# Patient Record
Sex: Male | Born: 1950 | Race: White | Hispanic: No | Smoking: Current every day smoker
Health system: Southern US, Community
[De-identification: ages and names within clinical notes are randomized; demographics above are authoritative.]

## PROBLEM LIST (undated history)

## (undated) DIAGNOSIS — R251 Tremor, unspecified: Secondary | ICD-10-CM

## (undated) DIAGNOSIS — E039 Hypothyroidism, unspecified: Secondary | ICD-10-CM

## (undated) DIAGNOSIS — G4700 Insomnia, unspecified: Secondary | ICD-10-CM

## (undated) DIAGNOSIS — F1010 Alcohol abuse, uncomplicated: Secondary | ICD-10-CM

## (undated) DIAGNOSIS — I10 Essential (primary) hypertension: Secondary | ICD-10-CM

## (undated) DIAGNOSIS — G629 Polyneuropathy, unspecified: Secondary | ICD-10-CM

## (undated) DIAGNOSIS — K219 Gastro-esophageal reflux disease without esophagitis: Secondary | ICD-10-CM

## (undated) DIAGNOSIS — E291 Testicular hypofunction: Secondary | ICD-10-CM

## (undated) DIAGNOSIS — R011 Cardiac murmur, unspecified: Secondary | ICD-10-CM

## (undated) DIAGNOSIS — G709 Myoneural disorder, unspecified: Secondary | ICD-10-CM

## (undated) DIAGNOSIS — E119 Type 2 diabetes mellitus without complications: Secondary | ICD-10-CM

## (undated) DIAGNOSIS — K759 Inflammatory liver disease, unspecified: Secondary | ICD-10-CM

## (undated) DIAGNOSIS — F32A Depression, unspecified: Secondary | ICD-10-CM

## (undated) DIAGNOSIS — M1990 Unspecified osteoarthritis, unspecified site: Secondary | ICD-10-CM

## (undated) DIAGNOSIS — C801 Malignant (primary) neoplasm, unspecified: Secondary | ICD-10-CM

## (undated) DIAGNOSIS — F419 Anxiety disorder, unspecified: Secondary | ICD-10-CM

## (undated) DIAGNOSIS — K5090 Crohn's disease, unspecified, without complications: Secondary | ICD-10-CM

## (undated) DIAGNOSIS — D649 Anemia, unspecified: Secondary | ICD-10-CM

## (undated) HISTORY — DX: Alcohol abuse, uncomplicated: F10.10

## (undated) HISTORY — PX: HERNIA REPAIR: SHX51

## (undated) HISTORY — DX: Crohn's disease, unspecified, without complications: K50.90

## (undated) HISTORY — DX: Testicular hypofunction: E29.1

## (undated) HISTORY — DX: Anxiety disorder, unspecified: F41.9

## (undated) HISTORY — DX: Polyneuropathy, unspecified: G62.9

## (undated) HISTORY — DX: Depression, unspecified: F32.A

## (undated) HISTORY — DX: Anemia, unspecified: D64.9

## (undated) HISTORY — DX: Insomnia, unspecified: G47.00

## (undated) HISTORY — DX: Tremor, unspecified: R25.1

## (undated) HISTORY — PX: OTHER SURGICAL HISTORY: SHX169

---

## 2014-01-14 ENCOUNTER — Other Ambulatory Visit: Payer: Self-pay | Admitting: Nurse Practitioner

## 2014-01-14 DIAGNOSIS — B182 Chronic viral hepatitis C: Secondary | ICD-10-CM

## 2014-01-14 DIAGNOSIS — K7460 Unspecified cirrhosis of liver: Secondary | ICD-10-CM

## 2014-02-28 ENCOUNTER — Ambulatory Visit
Admission: RE | Admit: 2014-02-28 | Discharge: 2014-02-28 | Disposition: A | Discharge status: Home / Self Care | Payer: BC Managed Care – PPO | Source: Ambulatory Visit | Attending: Nurse Practitioner | Admitting: Nurse Practitioner

## 2014-02-28 DIAGNOSIS — B182 Chronic viral hepatitis C: Secondary | ICD-10-CM

## 2014-02-28 DIAGNOSIS — K7460 Unspecified cirrhosis of liver: Secondary | ICD-10-CM

## 2014-08-24 ENCOUNTER — Other Ambulatory Visit: Payer: Self-pay | Admitting: Nurse Practitioner

## 2014-08-24 DIAGNOSIS — C220 Liver cell carcinoma: Secondary | ICD-10-CM

## 2014-09-08 ENCOUNTER — Ambulatory Visit
Admission: RE | Admit: 2014-09-08 | Discharge: 2014-09-08 | Disposition: A | Discharge status: Home / Self Care | Payer: BLUE CROSS/BLUE SHIELD | Source: Ambulatory Visit | Attending: Nurse Practitioner | Admitting: Nurse Practitioner

## 2014-09-08 DIAGNOSIS — C220 Liver cell carcinoma: Secondary | ICD-10-CM

## 2015-03-01 ENCOUNTER — Other Ambulatory Visit (HOSPITAL_COMMUNITY): Payer: Self-pay | Admitting: Nurse Practitioner

## 2015-03-01 DIAGNOSIS — R188 Other ascites: Secondary | ICD-10-CM

## 2015-03-02 ENCOUNTER — Other Ambulatory Visit: Payer: Self-pay | Admitting: Nurse Practitioner

## 2015-03-02 DIAGNOSIS — K7469 Other cirrhosis of liver: Secondary | ICD-10-CM

## 2015-03-13 ENCOUNTER — Other Ambulatory Visit (HOSPITAL_COMMUNITY): Payer: Self-pay | Admitting: Nurse Practitioner

## 2015-03-13 ENCOUNTER — Ambulatory Visit (HOSPITAL_COMMUNITY)
Admission: RE | Admit: 2015-03-13 | Discharge: 2015-03-13 | Disposition: A | Discharge status: Home / Self Care | Payer: BLUE CROSS/BLUE SHIELD | Source: Ambulatory Visit | Attending: Nurse Practitioner | Admitting: Nurse Practitioner

## 2015-03-13 DIAGNOSIS — R188 Other ascites: Secondary | ICD-10-CM | POA: Diagnosis not present

## 2015-03-13 NOTE — Progress Notes (Signed)
Patient presented for US guided paracentesis, upon reviewing images no ascites is seen. Procedure has been cancelled.  Tsosie Billing PA-C Interventional Radiology  03/13/15  11:48 AM

## 2015-03-31 ENCOUNTER — Ambulatory Visit
Admission: RE | Admit: 2015-03-31 | Discharge: 2015-03-31 | Disposition: A | Discharge status: Home / Self Care | Payer: BLUE CROSS/BLUE SHIELD | Source: Ambulatory Visit | Attending: Nurse Practitioner | Admitting: Nurse Practitioner

## 2015-03-31 DIAGNOSIS — K7469 Other cirrhosis of liver: Secondary | ICD-10-CM

## 2015-03-31 MED ORDER — GADOXETATE DISODIUM 0.25 MMOL/ML IV SOLN
10.0000 mL | Freq: Once | INTRAVENOUS | Status: AC | PRN
  Administered 2015-03-31: 10 mL via INTRAVENOUS

## 2015-07-17 ENCOUNTER — Other Ambulatory Visit: Payer: Self-pay | Admitting: Physician Assistant

## 2015-07-17 DIAGNOSIS — K741 Hepatic sclerosis: Secondary | ICD-10-CM

## 2015-09-27 ENCOUNTER — Ambulatory Visit
Admission: RE | Admit: 2015-09-27 | Discharge: 2015-09-27 | Disposition: A | Discharge status: Home / Self Care | Payer: BLUE CROSS/BLUE SHIELD | Source: Ambulatory Visit | Attending: Physician Assistant | Admitting: Physician Assistant

## 2015-09-27 DIAGNOSIS — K741 Hepatic sclerosis: Secondary | ICD-10-CM

## 2015-11-29 DIAGNOSIS — E1165 Type 2 diabetes mellitus with hyperglycemia: Secondary | ICD-10-CM | POA: Diagnosis not present

## 2015-11-29 DIAGNOSIS — E782 Mixed hyperlipidemia: Secondary | ICD-10-CM | POA: Diagnosis not present

## 2015-12-01 DIAGNOSIS — E038 Other specified hypothyroidism: Secondary | ICD-10-CM | POA: Diagnosis not present

## 2015-12-01 DIAGNOSIS — I10 Essential (primary) hypertension: Secondary | ICD-10-CM | POA: Diagnosis not present

## 2015-12-01 DIAGNOSIS — E782 Mixed hyperlipidemia: Secondary | ICD-10-CM | POA: Diagnosis not present

## 2015-12-01 DIAGNOSIS — Z794 Long term (current) use of insulin: Secondary | ICD-10-CM | POA: Diagnosis not present

## 2015-12-01 DIAGNOSIS — E1142 Type 2 diabetes mellitus with diabetic polyneuropathy: Secondary | ICD-10-CM | POA: Diagnosis not present

## 2015-12-01 DIAGNOSIS — E1165 Type 2 diabetes mellitus with hyperglycemia: Secondary | ICD-10-CM | POA: Diagnosis not present

## 2015-12-01 DIAGNOSIS — R809 Proteinuria, unspecified: Secondary | ICD-10-CM | POA: Diagnosis not present

## 2015-12-01 DIAGNOSIS — R801 Persistent proteinuria, unspecified: Secondary | ICD-10-CM | POA: Diagnosis not present

## 2016-01-29 DIAGNOSIS — Z79899 Other long term (current) drug therapy: Secondary | ICD-10-CM | POA: Diagnosis not present

## 2016-01-29 DIAGNOSIS — Z6835 Body mass index (BMI) 35.0-35.9, adult: Secondary | ICD-10-CM | POA: Diagnosis not present

## 2016-01-29 DIAGNOSIS — L309 Dermatitis, unspecified: Secondary | ICD-10-CM | POA: Diagnosis not present

## 2016-01-29 DIAGNOSIS — B356 Tinea cruris: Secondary | ICD-10-CM | POA: Diagnosis not present

## 2016-02-16 DIAGNOSIS — F17200 Nicotine dependence, unspecified, uncomplicated: Secondary | ICD-10-CM | POA: Diagnosis not present

## 2016-02-16 DIAGNOSIS — I10 Essential (primary) hypertension: Secondary | ICD-10-CM | POA: Diagnosis not present

## 2016-02-16 DIAGNOSIS — E785 Hyperlipidemia, unspecified: Secondary | ICD-10-CM | POA: Diagnosis not present

## 2016-02-16 DIAGNOSIS — E1140 Type 2 diabetes mellitus with diabetic neuropathy, unspecified: Secondary | ICD-10-CM | POA: Diagnosis not present

## 2016-02-16 DIAGNOSIS — R9431 Abnormal electrocardiogram [ECG] [EKG]: Secondary | ICD-10-CM | POA: Diagnosis not present

## 2016-02-16 DIAGNOSIS — Z0000 Encounter for general adult medical examination without abnormal findings: Secondary | ICD-10-CM | POA: Diagnosis not present

## 2016-02-16 DIAGNOSIS — Z125 Encounter for screening for malignant neoplasm of prostate: Secondary | ICD-10-CM | POA: Diagnosis not present

## 2016-02-16 DIAGNOSIS — E039 Hypothyroidism, unspecified: Secondary | ICD-10-CM | POA: Diagnosis not present

## 2016-02-16 DIAGNOSIS — C61 Malignant neoplasm of prostate: Secondary | ICD-10-CM | POA: Diagnosis not present

## 2016-02-22 DIAGNOSIS — M1712 Unilateral primary osteoarthritis, left knee: Secondary | ICD-10-CM | POA: Diagnosis not present

## 2016-02-28 DIAGNOSIS — R748 Abnormal levels of other serum enzymes: Secondary | ICD-10-CM | POA: Diagnosis not present

## 2016-02-28 DIAGNOSIS — R9431 Abnormal electrocardiogram [ECG] [EKG]: Secondary | ICD-10-CM | POA: Diagnosis not present

## 2016-02-28 DIAGNOSIS — E1169 Type 2 diabetes mellitus with other specified complication: Secondary | ICD-10-CM | POA: Diagnosis not present

## 2016-02-28 DIAGNOSIS — E785 Hyperlipidemia, unspecified: Secondary | ICD-10-CM | POA: Diagnosis not present

## 2016-02-28 DIAGNOSIS — Z01810 Encounter for preprocedural cardiovascular examination: Secondary | ICD-10-CM | POA: Diagnosis not present

## 2016-02-28 DIAGNOSIS — M1711 Unilateral primary osteoarthritis, right knee: Secondary | ICD-10-CM | POA: Diagnosis not present

## 2016-02-29 ENCOUNTER — Ambulatory Visit: Payer: Self-pay | Admitting: Orthopedic Surgery

## 2016-02-29 DIAGNOSIS — E038 Other specified hypothyroidism: Secondary | ICD-10-CM | POA: Diagnosis not present

## 2016-02-29 DIAGNOSIS — E1165 Type 2 diabetes mellitus with hyperglycemia: Secondary | ICD-10-CM | POA: Diagnosis not present

## 2016-02-29 DIAGNOSIS — E782 Mixed hyperlipidemia: Secondary | ICD-10-CM | POA: Diagnosis not present

## 2016-03-04 ENCOUNTER — Other Ambulatory Visit: Payer: Self-pay | Admitting: Nurse Practitioner

## 2016-03-04 DIAGNOSIS — K7469 Other cirrhosis of liver: Secondary | ICD-10-CM

## 2016-03-04 DIAGNOSIS — Z23 Encounter for immunization: Secondary | ICD-10-CM | POA: Diagnosis not present

## 2016-03-04 DIAGNOSIS — R809 Proteinuria, unspecified: Secondary | ICD-10-CM | POA: Diagnosis not present

## 2016-03-04 DIAGNOSIS — K7581 Nonalcoholic steatohepatitis (NASH): Secondary | ICD-10-CM | POA: Diagnosis not present

## 2016-03-04 DIAGNOSIS — Z794 Long term (current) use of insulin: Secondary | ICD-10-CM | POA: Diagnosis not present

## 2016-03-04 DIAGNOSIS — E1165 Type 2 diabetes mellitus with hyperglycemia: Secondary | ICD-10-CM | POA: Diagnosis not present

## 2016-03-04 DIAGNOSIS — E782 Mixed hyperlipidemia: Secondary | ICD-10-CM | POA: Diagnosis not present

## 2016-03-04 DIAGNOSIS — R801 Persistent proteinuria, unspecified: Secondary | ICD-10-CM | POA: Diagnosis not present

## 2016-03-04 DIAGNOSIS — I10 Essential (primary) hypertension: Secondary | ICD-10-CM | POA: Diagnosis not present

## 2016-03-04 DIAGNOSIS — E1142 Type 2 diabetes mellitus with diabetic polyneuropathy: Secondary | ICD-10-CM | POA: Diagnosis not present

## 2016-03-04 DIAGNOSIS — E038 Other specified hypothyroidism: Secondary | ICD-10-CM | POA: Diagnosis not present

## 2016-03-05 DIAGNOSIS — Z01810 Encounter for preprocedural cardiovascular examination: Secondary | ICD-10-CM | POA: Diagnosis not present

## 2016-03-05 DIAGNOSIS — R9431 Abnormal electrocardiogram [ECG] [EKG]: Secondary | ICD-10-CM | POA: Diagnosis not present

## 2016-03-12 ENCOUNTER — Ambulatory Visit
Admission: RE | Admit: 2016-03-12 | Discharge: 2016-03-12 | Disposition: A | Discharge status: Home / Self Care | Payer: Medicare Other | Source: Ambulatory Visit | Attending: Nurse Practitioner | Admitting: Nurse Practitioner

## 2016-03-12 DIAGNOSIS — K7469 Other cirrhosis of liver: Secondary | ICD-10-CM

## 2016-03-12 DIAGNOSIS — K7460 Unspecified cirrhosis of liver: Secondary | ICD-10-CM | POA: Diagnosis not present

## 2016-03-15 ENCOUNTER — Ambulatory Visit: Payer: Self-pay | Admitting: Orthopedic Surgery

## 2016-04-02 DIAGNOSIS — N529 Male erectile dysfunction, unspecified: Secondary | ICD-10-CM | POA: Diagnosis not present

## 2016-04-02 DIAGNOSIS — M1712 Unilateral primary osteoarthritis, left knee: Secondary | ICD-10-CM | POA: Diagnosis not present

## 2016-04-02 DIAGNOSIS — K219 Gastro-esophageal reflux disease without esophagitis: Secondary | ICD-10-CM | POA: Diagnosis not present

## 2016-04-02 DIAGNOSIS — E1140 Type 2 diabetes mellitus with diabetic neuropathy, unspecified: Secondary | ICD-10-CM | POA: Diagnosis not present

## 2016-04-02 DIAGNOSIS — I452 Bifascicular block: Secondary | ICD-10-CM | POA: Diagnosis not present

## 2016-04-02 DIAGNOSIS — Z6835 Body mass index (BMI) 35.0-35.9, adult: Secondary | ICD-10-CM | POA: Diagnosis not present

## 2016-04-02 DIAGNOSIS — I10 Essential (primary) hypertension: Secondary | ICD-10-CM | POA: Diagnosis not present

## 2016-04-09 DIAGNOSIS — H25813 Combined forms of age-related cataract, bilateral: Secondary | ICD-10-CM | POA: Diagnosis not present

## 2016-04-09 DIAGNOSIS — E113293 Type 2 diabetes mellitus with mild nonproliferative diabetic retinopathy without macular edema, bilateral: Secondary | ICD-10-CM | POA: Diagnosis not present

## 2016-04-18 DIAGNOSIS — M1712 Unilateral primary osteoarthritis, left knee: Secondary | ICD-10-CM | POA: Diagnosis not present

## 2016-04-26 NOTE — Patient Instructions (Signed)
Benjamin Aguilar  04/26/2016   Your procedure is scheduled on: 05/06/2016    Report to [facility] Main  Entrance take [place]  elevators to 3rd floor to  [place] at   1145 AM.  Call this number if you have problems the morning of surgery [phone]   Remember: ONLY 1 PERSON MAY GO WITH YOU TO SHORT STAY TO GET  READY MORNING OF [place].  Do not eat food after midnite.  May have clear liquids from 12 midnite until 0700am morning of surgery then nothing by mouth.      Take these medicines the morning of surgery with A SIP OF WATER: Amlodipine ( NOrvasc), synthroid, Lyrica, Metoprolol ( Toprol ), Prilosec  DO NOT TAKE ANY DIABETIC MEDICATIONS DAY OF YOUR SURGERY                               You may not have any metal on your body including hair pins and              piercings  Do not wear jewelry, , lotions, powders or perfumes, deodorant                           Men may shave face and neck.   Do not bring valuables to the hospital. [place].  Contacts, dentures or bridgework may not be worn into surgery.  Leave suitcase in the car. After surgery it may be brought to your room.       Special Instructions: N/A              Please read over the following fact sheets you were given: _____________________________________________________________________             [facility] - Preparing for Surgery Before surgery, you can play an important role.  Because skin is not sterile, your skin needs to be as free of germs as possible.  You can reduce the number of germs on your skin by washing with CHG (chlorahexidine gluconate) soap before surgery.  CHG is an antiseptic cleaner which kills germs and bonds with the skin to continue killing germs even after washing. Please DO NOT use if you have an allergy to CHG or antibacterial soaps.  If your skin becomes reddened/irritated stop using the CHG and inform  your nurse when you arrive at Short Stay. Do not shave (including legs and underarms) for at least 48 hours prior to the first CHG shower.  You may shave your face/neck. Please follow these instructions carefully:  1.  Shower with CHG Soap the night before surgery and the  morning of Surgery.  2.  If you choose to wash your hair, wash your hair first as usual with your  normal  shampoo.  3.  After you shampoo, rinse your hair and body thoroughly to remove the  shampoo.                           4.  Use CHG as you would any other liquid soap.  You can apply chg directly  to the skin and wash  Gently with a scrungie or clean washcloth.  5.  Apply the CHG Soap to your body ONLY FROM THE NECK DOWN.   Do not use on face/ open                           Wound or open sores. Avoid contact with eyes, ears mouth and genitals (private parts).                       Wash face,  Genitals (private parts) with your normal soap.             6.  Wash thoroughly, paying special attention to the area where your surgery  will be performed.  7.  Thoroughly rinse your body with warm water from the neck down.  8.  DO NOT shower/wash with your normal soap after using and rinsing off  the CHG Soap.                9.  Pat yourself dry with a clean towel.            10.  Wear clean pajamas.            11.  Place clean sheets on your bed the night of your first shower and do not  sleep with pets. Day of Surgery : Do not apply any lotions/deodorants the morning of surgery.  Please wear clean clothes to the hospital/surgery center.  FAILURE TO FOLLOW THESE INSTRUCTIONS MAY RESULT IN THE CANCELLATION OF YOUR SURGERY PATIENT SIGNATURE_________________________________  NURSE SIGNATURE__________________________________  ________________________________________________________________________    CLEAR LIQUID DIET   Foods Allowed                                                                      Foods Excluded  Coffee and tea, regular and decaf                             liquids that you cannot  Plain Jell-O in any flavor                                             see through such as: Fruit ices (not with fruit pulp)                                     milk, soups, orange juice  Iced Popsicles                                    All solid food Carbonated beverages, regular and diet                                    Cranberry, grape and apple juices Sports drinks like Gatorade Lightly seasoned clear broth or consume(fat free) Sugar, honey syrup  Sample Menu Breakfast                                Lunch                                     Supper Cranberry juice                    Beef broth                            Chicken broth Jell-O                                     Grape juice                           Apple juice Coffee or tea                        Jell-O                                      Popsicle                                                Coffee or tea                        Coffee or tea  _____________________________________________________________________  How to Manage Your Diabetes Before and After Surgery  Why is it important to control my blood sugar before and after surgery? . Improving blood sugar levels before and after surgery helps healing and can limit problems. . A way of improving blood sugar control is eating a healthy diet by: o  Eating less sugar and carbohydrates o  Increasing activity/exercise o  Talking with your doctor about reaching your blood sugar goals . High blood sugars (greater than 180 mg/dL) can raise your risk of infections and slow your recovery, so you will need to focus on controlling your diabetes during the weeks before surgery. . Make sure that the doctor who takes care of your diabetes knows about your planned surgery including the date and location.  How do I manage my blood sugar before surgery? . Check your blood  sugar at least 4 times a day, starting 2 days before surgery, to make sure that the level is not too high or low. o Check your blood sugar the morning of your surgery when you wake up and every 2 hours until you get to the Short Stay unit. . If your blood sugar is less than 70 mg/dL, you will need to treat for low blood sugar: o Do not take insulin. o Treat a low blood sugar (less than 70 mg/dL) with  cup of clear juice (cranberry or apple), 4 glucose tablets, OR glucose gel. o Recheck blood sugar in 15 minutes after treatment (to make sure it is greater than 70 mg/dL). If your blood  sugar is not greater than 70 mg/dL on recheck, call [phone] for further instructions. . Report your blood sugar to the short stay nurse when you get to Short Stay.  . If you are admitted to the hospital after surgery: o Your blood sugar will be checked by the staff and you will probably be given insulin after surgery (instead of oral diabetes medicines) to make sure you have good blood sugar levels. o The goal for blood sugar control after surgery is 80-180 mg/dL.   WHAT DO I DO ABOUT MY DIABETES MEDICATION?  Marland Kitchen Do not take oral diabetes medicines (pills) the morning of surgery or Victoza  . Do not take evening dose of Glucotrol the nite before surgery .  Take only am doses of Glucotrol the day before surgery.   Anastasio Auerbach - take normal doses day before surgery along with Metformin and Victoza   . THE NIGHT BEFORE SURGERY, take _____30______ units of _____Levemir ______insulin.       . THE MORNING OF SURGERY, take _________0____ units of __________insulin.  . The day of surgery, do not take other diabetes injectables, including Byetta (exenatide), Bydureon (exenatide ER), Victoza (liraglutide), or Trulicity (dulaglutide).  .   .        Patient Signature:  Date:   Nurse Signature:  Date:   Reviewed and Endorsed by [facility] Patient Education Committee, August 2015 WHAT IS A BLOOD TRANSFUSION?  Blood Transfusion Information  A transfusion is the replacement of blood or some of its parts. Blood is made up of multiple cells which provide different functions.  Red blood cells carry oxygen and are used for blood loss replacement.  White blood cells fight against infection.  Platelets control bleeding.  Plasma helps clot blood.  Other blood products are available for specialized needs, such as hemophilia or other clotting disorders. BEFORE THE TRANSFUSION  Who gives blood for transfusions?   Healthy volunteers who are fully evaluated to make sure their blood is safe. This is blood bank blood. Transfusion therapy is the safest it has ever been in the practice of medicine. Before blood is taken from a donor, a complete history is taken to make sure that person has no history of diseases nor engages in risky social behavior (examples are intravenous drug use or sexual activity with multiple partners). The donor's travel history is screened to minimize risk of transmitting infections, such as malaria. The donated blood is tested for signs of infectious diseases, such as HIV and hepatitis. The blood is then tested to be sure it is compatible with you in order to minimize the chance of a transfusion reaction. If you or a relative donates blood, this is often done in anticipation of surgery and is not appropriate for emergency situations. It takes many days to process the donated blood. RISKS AND COMPLICATIONS Although transfusion therapy is very safe and saves many lives, the main dangers of transfusion include:   Getting an infectious disease.  Developing a transfusion reaction. This is an allergic reaction to something in the blood you were given. Every precaution is taken to prevent this. The decision to have a blood transfusion has been considered carefully by your caregiver before blood is given. Blood is not given unless the benefits outweigh the risks. AFTER THE TRANSFUSION  Right  after receiving a blood transfusion, you will usually feel much better and more energetic. This is especially true if your red blood cells have gotten low (anemic). The transfusion raises the level of  the red blood cells which carry oxygen, and this usually causes an energy increase.  The nurse administering the transfusion will monitor you carefully for complications. HOME CARE INSTRUCTIONS  No special instructions are needed after a transfusion. You may find your energy is better. Speak with your caregiver about any limitations on activity for underlying diseases you may have. SEEK MEDICAL CARE IF:   Your condition is not improving after your transfusion.  You develop redness or irritation at the intravenous (IV) site. SEEK IMMEDIATE MEDICAL CARE IF:  Any of the following symptoms occur over the next 12 hours:  Shaking chills.  You have a temperature by mouth above 102 F (38.9 C), not controlled by medicine.  Chest, back, or muscle pain.  People around you feel you are not acting correctly or are confused.  Shortness of breath or difficulty breathing.  Dizziness and fainting.  You get a rash or develop hives.  You have a decrease in urine output.  Your urine turns a dark color or changes to pink, red, or brown. Any of the following symptoms occur over the next 10 days:  You have a temperature by mouth above 102 F (38.9 C), not controlled by medicine.  Shortness of breath.  Weakness after normal activity.  The white part of the eye turns yellow (jaundice).  You have a decrease in the amount of urine or are urinating less often.  Your urine turns a dark color or changes to pink, red, or brown. Document Released: 05/03/2000 Document Revised: 07/29/2011 Document Reviewed: 12/21/2007 ExitCare Patient Information 2014 [place].  _______________________________________________________________________  Incentive Spirometer  An incentive spirometer is a tool that  can help keep your lungs clear and active. This tool measures how well you are filling your lungs with each breath. Taking long deep breaths may help reverse or decrease the chance of developing breathing (pulmonary) problems (especially infection) following:  A long period of time when you are unable to move or be active. BEFORE THE PROCEDURE   If the spirometer includes an indicator to show your best effort, your nurse or respiratory therapist will set it to a desired goal.  If possible, sit up straight or lean slightly forward. Try not to slouch.  Hold the incentive spirometer in an upright position. INSTRUCTIONS FOR USE  1. Sit on the edge of your bed if possible, or sit up as far as you can in bed or on a chair. 2. Hold the incentive spirometer in an upright position. 3. Breathe out normally. 4. Place the mouthpiece in your mouth and seal your lips tightly around it. 5. Breathe in slowly and as deeply as possible, raising the piston or the ball toward the top of the column. 6. Hold your breath for 3-5 seconds or for as long as possible. Allow the piston or ball to fall to the bottom of the column. 7. Remove the mouthpiece from your mouth and breathe out normally. 8. Rest for a few seconds and repeat Steps 1 through 7 at least 10 times every 1-2 hours when you are awake. Take your time and take a few normal breaths between deep breaths. 9. The spirometer may include an indicator to show your best effort. Use the indicator as a goal to work toward during each repetition. 10. After each set of 10 deep breaths, practice coughing to be sure your lungs are clear. If you have an incision (the cut made at the time of surgery), support your incision when coughing by placing  a pillow or rolled up towels firmly against it. Once you are able to get out of bed, walk around indoors and cough well. You may stop using the incentive spirometer when instructed by your caregiver.  RISKS AND  COMPLICATIONS  Take your time so you do not get dizzy or light-headed.  If you are in pain, you may need to take or ask for pain medication before doing incentive spirometry. It is harder to take a deep breath if you are having pain. AFTER USE  Rest and breathe slowly and easily.  It can be helpful to keep track of a log of your progress. Your caregiver can provide you with a simple table to help with this. If you are using the spirometer at home, follow these instructions: [place] IF:   You are having difficultly using the spirometer.  You have trouble using the spirometer as often as instructed.  Your pain medication is not giving enough relief while using the spirometer.  You develop fever of 100.5 F (38.1 C) or higher. SEEK IMMEDIATE MEDICAL CARE IF:   You cough up bloody sputum that had not been present before.  You develop fever of 102 F (38.9 C) or greater.  You develop worsening pain at or near the incision site. MAKE SURE YOU:   Understand these instructions.  Will watch your condition.  Will get help right away if you are not doing well or get worse. Document Released: 09/16/2006 Document Revised: 07/29/2011 Document Reviewed: 11/17/2006 [facility] Patient Information 2014 [place], [place].   ________________________________________________________________________

## 2016-04-30 ENCOUNTER — Encounter (HOSPITAL_COMMUNITY)
Admission: RE | Admit: 2016-04-30 | Discharge: 2016-04-30 | Disposition: A | Discharge status: Home / Self Care | Payer: Medicare Other | Source: Ambulatory Visit | Attending: Orthopedic Surgery | Admitting: Orthopedic Surgery

## 2016-04-30 ENCOUNTER — Encounter (HOSPITAL_COMMUNITY): Payer: Self-pay

## 2016-04-30 DIAGNOSIS — M1712 Unilateral primary osteoarthritis, left knee: Secondary | ICD-10-CM | POA: Diagnosis not present

## 2016-04-30 DIAGNOSIS — Z01812 Encounter for preprocedural laboratory examination: Secondary | ICD-10-CM | POA: Diagnosis not present

## 2016-04-30 HISTORY — DX: Inflammatory liver disease, unspecified: K75.9

## 2016-04-30 HISTORY — DX: Unspecified osteoarthritis, unspecified site: M19.90

## 2016-04-30 HISTORY — DX: Hypothyroidism, unspecified: E03.9

## 2016-04-30 HISTORY — DX: Cardiac murmur, unspecified: R01.1

## 2016-04-30 HISTORY — DX: Essential (primary) hypertension: I10

## 2016-04-30 HISTORY — DX: Malignant (primary) neoplasm, unspecified: C80.1

## 2016-04-30 HISTORY — DX: Type 2 diabetes mellitus without complications: E11.9

## 2016-04-30 HISTORY — DX: Gastro-esophageal reflux disease without esophagitis: K21.9

## 2016-04-30 HISTORY — DX: Myoneural disorder, unspecified: G70.9

## 2016-04-30 LAB — URINALYSIS, ROUTINE W REFLEX MICROSCOPIC
BILIRUBIN URINE: NEGATIVE
Bacteria, UA: NONE SEEN
Hgb urine dipstick: NEGATIVE
KETONES UR: NEGATIVE mg/dL
LEUKOCYTES UA: NEGATIVE
Nitrite: NEGATIVE
PH: 5 (ref 5.0–8.0)
PROTEIN: NEGATIVE mg/dL
SQUAMOUS EPITHELIAL / LPF: NONE SEEN
Specific Gravity, Urine: 1.027 (ref 1.005–1.030)

## 2016-04-30 LAB — CBC
HEMATOCRIT: 45.2 % (ref 39.0–52.0)
HEMOGLOBIN: 14.8 g/dL (ref 13.0–17.0)
MCH: 27.8 pg (ref 26.0–34.0)
MCHC: 32.7 g/dL (ref 30.0–36.0)
MCV: 84.8 fL (ref 78.0–100.0)
Platelets: 112 10*3/uL — ABNORMAL LOW (ref 150–400)
RBC: 5.33 MIL/uL (ref 4.22–5.81)
RDW: 14.5 % (ref 11.5–15.5)
WBC: 4.4 10*3/uL (ref 4.0–10.5)

## 2016-04-30 LAB — SURGICAL PCR SCREEN
MRSA, PCR: NEGATIVE
Staphylococcus aureus: POSITIVE — AB

## 2016-04-30 LAB — COMPREHENSIVE METABOLIC PANEL
ALK PHOS: 61 U/L (ref 38–126)
ALT: 67 U/L — AB (ref 17–63)
ANION GAP: 5 (ref 5–15)
AST: 48 U/L — ABNORMAL HIGH (ref 15–41)
Albumin: 4.1 g/dL (ref 3.5–5.0)
BILIRUBIN TOTAL: 0.8 mg/dL (ref 0.3–1.2)
BUN: 19 mg/dL (ref 6–20)
CALCIUM: 9 mg/dL (ref 8.9–10.3)
CO2: 24 mmol/L (ref 22–32)
CREATININE: 0.96 mg/dL (ref 0.61–1.24)
Chloride: 112 mmol/L — ABNORMAL HIGH (ref 101–111)
GFR calc non Af Amer: >60 mL/min (ref >60)
Glucose, Bld: 107 mg/dL — ABNORMAL HIGH (ref 65–99)
Potassium: 5.1 mmol/L (ref 3.5–5.1)
Sodium: 141 mmol/L (ref 135–145)
TOTAL PROTEIN: 7 g/dL (ref 6.5–8.1)

## 2016-04-30 LAB — GLUCOSE, CAPILLARY: GLUCOSE-CAPILLARY: 112 mg/dL — AB (ref 65–99)

## 2016-04-30 LAB — ABO/RH: ABO/RH(D): A NEG

## 2016-04-30 LAB — PROTIME-INR
INR: 1.07
Prothrombin Time: 13.9 seconds (ref 11.4–15.2)

## 2016-04-30 LAB — APTT: aPTT: 37 seconds — ABNORMAL HIGH (ref 24–36)

## 2016-04-30 NOTE — Progress Notes (Signed)
04/02/16- LOV- Delena Bali- onn chart  DR Jimmie Molly- LOV on chart  02/28/16- EKG on chart  03/05/16- Stress Test on chart

## 2016-04-30 NOTE — Progress Notes (Signed)
U/A done 04/30/16 faxed via EPIC to Dr Wynelle Link.

## 2016-04-30 NOTE — Progress Notes (Signed)
CBC done 04/30/16 faxed via EPIC to Dr Wynelle Link.

## 2016-05-01 LAB — HEMOGLOBIN A1C
HEMOGLOBIN A1C: 6.4 % — AB (ref 4.8–5.6)
MEAN PLASMA GLUCOSE: 137 mg/dL

## 2016-05-05 ENCOUNTER — Ambulatory Visit: Payer: Self-pay | Admitting: Orthopedic Surgery

## 2016-05-05 NOTE — H&P (Signed)
Benjamin Aguilar DOB: 1951-03-06 Married / Language: English / Race: White Male Date of Admission:  05/06/2016 CC:  Left Knee Pain History of Present Illness The patient is a 65 year old male who comes in for a preoperative History and Physical. The patient is scheduled for a left total knee arthroplasty to be performed by Dr. Dione Plover. [name], MD at [facility] on 05-06-2016. The patient is a 66 year old male who presented with knee complaints. The patient was seen for a second opinion. The patient reports left knee symptoms including: pain which began 1 year(s) ago without any known injury.The patient feels that the symptoms are worsening. The patient has the current diagnosis of knee osteoarthritis. Prior to being seen, the patient was previously evaluated by a colleague (Dr. Samule Dry in [place], as well as his PCP). Previous work-up for this problem has included knee x-rays and knee MRI. Past treatment for this problem has included intra-articular injection of corticosteroids (with aspiration; last injection has been about six months ago. He reports he only got minimal relief with the injection) and nonsteroidal anti-inflammatory drugs (meloxicam). Current treatment includes nonsteroidal anti-inflammatory drugs (ibprofen, prn) and non-opioid analgesics (topical analgesics). He states that he was told that he really needs to have the knee replaced. He is having pain with weightbearing and starting to have some soreness at rest. Unfortunately, his left knee is getting progressively worse. It is hurting at all times. It is limiting what he can and cannot do. The knee does not swell. Occasionally, he feels like it wants to give out. It has not locked on him. It is starting to hurt him at night. He has had injections of cortisone without any benefit. He is not interested in any further injections. He would like a more permanent solution to this. He is ready to get the knee fixed. They have been  treated conservatively in the past for the above stated problem and despite conservative measures, they continue to have progressive pain and severe functional limitations and dysfunction. They have failed non-operative management including home exercise, medications, and injections. It is felt that they would benefit from undergoing total joint replacement. Risks and benefits of the procedure have been discussed with the patient and they elect to proceed with surgery. There are no active contraindications to surgery such as ongoing infection or rapidly progressive neurological disease.  Problem List/Past Medical Primary osteoarthritis of left knee (M17.12)  Crohn's Disease  Depression  Diabetes Mellitus, Type II  Hepatitis C  High blood pressure  Hypercholesterolemia  Hypothyroidism  Liver disease  Peripheral Neuropathy  Prostate Cancer  Prostate Disease  Tinnitus  Liver Cirrhosis  Alcoholism   Allergies No Known Drug Allergies   Family History  Cancer  Father. Cerebrovascular Accident  Mother. Chronic Obstructive Lung Disease  Father. Depression  Brother. Diabetes Mellitus  Father. Hypertension  Father. Osteoarthritis  Mother.  Social History  Children  2 Current work status  retired Furniture conservator/restorer rarely; does running / walking and gym / [employer] Former drinker  02/22/2016: In the past drank; he is a recovering alcoholic History of drug/alcohol rehab  he is a recovering alcoholic Living situation  live with parents Marital status  divorced Not under pain contract  Number of flights of stairs before winded  2-3 Tobacco / smoke exposure  02/22/2016: no Tobacco use  Former smoker. 02/22/2016: smoke(d) 1 pack(s) per day  Medication History  Multivitamin (Oral) Active. Vitamin D3 (1000UNIT Tablet, Oral) Active. Aspirin (81MG  Tablet, Oral)  Active. Omeprazole (20MG  Tablet DR, Oral) Active. Zetia (10MG  Tablet, Oral)  Active. Levothyroxine Sodium (112MCG Tablet, Oral) Active. Metoprolol Succinate (100MG  Tablet ER, Oral) Active. AmLODIPine Besylate (5MG  Tablet, Oral) Active. Meloxicam (15MG  Tablet, Oral) Active. Lisinopril (40MG  Tablet, Oral) Active. Chantix (1MG  Tablet, Oral) Active. MetFORMIN HCl (1000MG  Tablet, Oral) Active. GlipiZIDE (10MG  Tablet, Oral) Active. Levemir FlexTouch (100UNIT/ML Soln Pen-inj, Subcutaneous) Active. NovoLOG FlexPen (100UNIT/ML Solution, Subcutaneous) Active. Lyrica (300MG  Capsule, Oral) Active. Invokana (300MG  Tablet, Oral) Active. Lyrica (50MG  Capsule, Oral) Active. Victoza (18MG /3ML Soln Pen-inj, Subcutaneous) Active. Welchol (625MG  Tablet, Oral) Active.  Past Surgical History Vasectomy  Umbilical Hernia Surgery  Date: 35. Prostate Seeding  Date: 2008.   Review of Systems General Not Present- Chills, Fatigue, Fever, Memory Loss, Night Sweats, Weight Gain and Weight Loss. Skin Not Present- Eczema, Hives, Itching, Lesions and Rash. HEENT Present- Tinnitus. Not Present- Dentures, Double Vision, Headache, Hearing Loss and Visual Loss. Respiratory Present- Shortness of breath with exertion. Not Present- Allergies, Chronic Cough, Coughing up blood and Shortness of breath at rest. Cardiovascular Not Present- Chest Pain, Difficulty Breathing Lying Down, Murmur, Palpitations, Racing/skipping heartbeats and Swelling. Gastrointestinal Not Present- Abdominal Pain, Bloody Stool, Constipation, Diarrhea, Difficulty Swallowing, Heartburn, Jaundice, Loss of appetitie, Nausea and Vomiting. Male Genitourinary Present- Urinating at Night. Not Present- Blood in Urine, Discharge, Flank Pain, Incontinence, Painful Urination, Urgency, Urinary frequency, Urinary Retention and Weak urinary stream. Musculoskeletal Not Present- Back Pain, Joint Pain, Joint Swelling, Morning Stiffness, Muscle Pain, Muscle Weakness and Spasms. Neurological Not Present- Blackout spells,  Difficulty with balance, Dizziness, Paralysis, Tremor and Weakness. Psychiatric Not Present- Insomnia.  Vitals  Weight: 238 lb Height: 69in Weight was reported by patient. Height was reported by patient. Body Surface Area: 2.22 m Body Mass Index: 35.15 kg/m  Pulse: 64 (Regular)  BP: 122/66 (Sitting, Right Arm, Standard)  Physical Exam  General Mental Status -Alert, cooperative and good historian. General Appearance-pleasant, Not in acute distress. Orientation-Oriented X3. Build & Nutrition-Well nourished and Well developed.  Head and Neck Head-normocephalic, atraumatic . Neck Global Assessment - supple, no bruit auscultated on the right, no bruit auscultated on the left.  Eye Vision-Wears corrective lenses. Pupil - Bilateral-Regular and Round. Motion - Bilateral-EOMI.  ENMT Note: upper partial denture   Chest and Lung Exam Auscultation Breath sounds - clear at anterior chest wall and clear at posterior chest wall. Adventitious sounds - No Adventitious sounds.  Cardiovascular Auscultation Rhythm - Regular rate and rhythm. Heart Sounds - S1 WNL and S2 WNL. Murmurs & Other Heart Sounds - Auscultation of the heart reveals - No Murmurs.  Abdomen Inspection Contour - Generalized moderate distention. Palpation/Percussion Tenderness - Abdomen is non-tender to palpation. Rigidity (guarding) - Abdomen is soft. Auscultation Auscultation of the abdomen reveals - Bowel sounds normal.  Male Genitourinary Note: Not done, not pertinent to present illness   Musculoskeletal Note: He is alert and oriented, in no apparent distress. Evaluation of his hips, normal range of motion with no discomfort. His right knee shows no effusion. His range is about 0 to 130. Slight tenderness medially, no lateral tenderness or instability. Left knee varus deformity, range 5 to 125, marked crepitus on range of motion. Tender medial greater than lateral with no  instability noted. He has an antalgic gait on the left. Pulse, sensation, motor intact.  RADIOGRAPHS AP of both knees, lateral of the left show bone on bone arthritis medial and patellofemoral on the left. He also has arthritic change in the right, but not as bad.  Assessment &  Plan Primary osteoarthritis of left knee (M17.12)  Note:Surgical Plans: Left Total Knee Replacement  Disposition: Home and straight to outpatient therapy  PCP: Dr. Ilda Basset - Patient has been seen preoperatively and felt to be stable for surgery. "The patient has no medical contraindications for planned left total knee replacement by Dr. Gaynelle Arabian on December 18th, 2017." Cardiology: Dr. Danie Binder - Patient has been seen preoperatively and felt to be stable for surgery. "Patient is low risk for knee surgery and may proceed".  Topical TXA  Anesthesia Issues: None  Signed electronically by Ok Edwards, III PA-C

## 2016-05-06 ENCOUNTER — Inpatient Hospital Stay (HOSPITAL_COMMUNITY)
Admission: RE | Admit: 2016-05-06 | Discharge: 2016-05-08 | DRG: 470 | Disposition: A | Discharge status: Home / Self Care | Payer: Medicare Other | Source: Ambulatory Visit | Attending: Orthopedic Surgery | Admitting: Orthopedic Surgery

## 2016-05-06 ENCOUNTER — Inpatient Hospital Stay (HOSPITAL_COMMUNITY): Payer: Medicare Other | Admitting: Anesthesiology

## 2016-05-06 ENCOUNTER — Encounter (HOSPITAL_COMMUNITY)
Admission: RE | Disposition: A | Discharge status: Home / Self Care | Payer: Self-pay | Source: Ambulatory Visit | Attending: Orthopedic Surgery

## 2016-05-06 ENCOUNTER — Encounter (HOSPITAL_COMMUNITY): Payer: Self-pay | Admitting: *Deleted

## 2016-05-06 DIAGNOSIS — Z87891 Personal history of nicotine dependence: Secondary | ICD-10-CM

## 2016-05-06 DIAGNOSIS — M1710 Unilateral primary osteoarthritis, unspecified knee: Secondary | ICD-10-CM | POA: Diagnosis present

## 2016-05-06 DIAGNOSIS — M1712 Unilateral primary osteoarthritis, left knee: Secondary | ICD-10-CM | POA: Diagnosis not present

## 2016-05-06 DIAGNOSIS — Z794 Long term (current) use of insulin: Secondary | ICD-10-CM | POA: Diagnosis not present

## 2016-05-06 DIAGNOSIS — Z79899 Other long term (current) drug therapy: Secondary | ICD-10-CM | POA: Diagnosis not present

## 2016-05-06 DIAGNOSIS — I10 Essential (primary) hypertension: Secondary | ICD-10-CM | POA: Diagnosis present

## 2016-05-06 DIAGNOSIS — E039 Hypothyroidism, unspecified: Secondary | ICD-10-CM | POA: Diagnosis present

## 2016-05-06 DIAGNOSIS — Z6834 Body mass index (BMI) 34.0-34.9, adult: Secondary | ICD-10-CM | POA: Diagnosis not present

## 2016-05-06 DIAGNOSIS — Z7982 Long term (current) use of aspirin: Secondary | ICD-10-CM | POA: Diagnosis not present

## 2016-05-06 DIAGNOSIS — G8918 Other acute postprocedural pain: Secondary | ICD-10-CM | POA: Diagnosis not present

## 2016-05-06 DIAGNOSIS — Z8546 Personal history of malignant neoplasm of prostate: Secondary | ICD-10-CM | POA: Diagnosis not present

## 2016-05-06 DIAGNOSIS — K219 Gastro-esophageal reflux disease without esophagitis: Secondary | ICD-10-CM | POA: Diagnosis present

## 2016-05-06 DIAGNOSIS — B1920 Unspecified viral hepatitis C without hepatic coma: Secondary | ICD-10-CM | POA: Diagnosis present

## 2016-05-06 DIAGNOSIS — E669 Obesity, unspecified: Secondary | ICD-10-CM | POA: Diagnosis present

## 2016-05-06 DIAGNOSIS — E119 Type 2 diabetes mellitus without complications: Secondary | ICD-10-CM | POA: Diagnosis present

## 2016-05-06 DIAGNOSIS — M179 Osteoarthritis of knee, unspecified: Secondary | ICD-10-CM | POA: Diagnosis present

## 2016-05-06 HISTORY — PX: TOTAL KNEE ARTHROPLASTY: SHX125

## 2016-05-06 LAB — GLUCOSE, CAPILLARY
GLUCOSE-CAPILLARY: 121 mg/dL — AB (ref 65–99)
GLUCOSE-CAPILLARY: 98 mg/dL (ref 65–99)

## 2016-05-06 LAB — TYPE AND SCREEN
ABO/RH(D): A NEG
Antibody Screen: NEGATIVE

## 2016-05-06 SURGERY — ARTHROPLASTY, KNEE, TOTAL
Anesthesia: Spinal | Site: Knee | Laterality: Left

## 2016-05-06 MED ORDER — MIDAZOLAM HCL 5 MG/5ML IJ SOLN
INTRAMUSCULAR | Status: DC | PRN
  Administered 2016-05-06: 1 mg via INTRAVENOUS

## 2016-05-06 MED ORDER — 0.9 % SODIUM CHLORIDE (POUR BTL) OPTIME
TOPICAL | Status: DC | PRN
  Administered 2016-05-06: 1000 mL

## 2016-05-06 MED ORDER — DIPHENHYDRAMINE HCL 12.5 MG/5ML PO ELIX: 12.5000 mg | ORAL_SOLUTION | ORAL | Status: DC | PRN

## 2016-05-06 MED ORDER — FENTANYL CITRATE (PF) 100 MCG/2ML IJ SOLN
50.0000 ug | Freq: Once | INTRAMUSCULAR | Status: AC
  Administered 2016-05-06: 50 ug via INTRAVENOUS

## 2016-05-06 MED ORDER — LEVOTHYROXINE SODIUM 112 MCG PO TABS
112.0000 ug | ORAL_TABLET | Freq: Every day | ORAL | Status: DC
  Administered 2016-05-07 – 2016-05-08 (×2): 112 ug via ORAL
  Filled 2016-05-06 (×2): qty 1

## 2016-05-06 MED ORDER — MIDAZOLAM HCL 2 MG/2ML IJ SOLN: 0.5000 mg | Freq: Once | INTRAMUSCULAR | Status: DC | PRN

## 2016-05-06 MED ORDER — PHENYLEPHRINE HCL 10 MG/ML IJ SOLN
INTRAVENOUS | Status: DC | PRN
  Administered 2016-05-06: 40 ug/min via INTRAVENOUS

## 2016-05-06 MED ORDER — PHENYLEPHRINE HCL 10 MG/ML IJ SOLN
INTRAMUSCULAR | Status: AC
  Filled 2016-05-06: qty 1

## 2016-05-06 MED ORDER — PROPOFOL 10 MG/ML IV BOLUS
INTRAVENOUS | Status: AC
  Filled 2016-05-06: qty 60

## 2016-05-06 MED ORDER — DEXAMETHASONE SODIUM PHOSPHATE 10 MG/ML IJ SOLN: 10.0000 mg | Freq: Once | INTRAMUSCULAR | Status: DC

## 2016-05-06 MED ORDER — MIDAZOLAM HCL 2 MG/2ML IJ SOLN
INTRAMUSCULAR | Status: AC
  Filled 2016-05-06: qty 2

## 2016-05-06 MED ORDER — ONDANSETRON HCL 4 MG/2ML IJ SOLN: 4.0000 mg | Freq: Four times a day (QID) | INTRAMUSCULAR | Status: DC | PRN

## 2016-05-06 MED ORDER — GLIPIZIDE 5 MG PO TABS
10.0000 mg | ORAL_TABLET | Freq: Two times a day (BID) | ORAL | Status: DC
  Administered 2016-05-07 – 2016-05-08 (×3): 10 mg via ORAL
  Filled 2016-05-06 (×3): qty 2

## 2016-05-06 MED ORDER — TRANEXAMIC ACID 1000 MG/10ML IV SOLN
2000.0000 mg | Freq: Once | INTRAVENOUS | Status: DC
  Filled 2016-05-06: qty 20

## 2016-05-06 MED ORDER — TRAMADOL HCL 50 MG PO TABS
50.0000 mg | ORAL_TABLET | Freq: Four times a day (QID) | ORAL | Status: DC | PRN
  Administered 2016-05-07 – 2016-05-08 (×2): 100 mg via ORAL
  Filled 2016-05-06 (×2): qty 2

## 2016-05-06 MED ORDER — METOCLOPRAMIDE HCL 5 MG PO TABS: 5.0000 mg | ORAL_TABLET | Freq: Three times a day (TID) | ORAL | Status: DC | PRN

## 2016-05-06 MED ORDER — DEXAMETHASONE SODIUM PHOSPHATE 10 MG/ML IJ SOLN
INTRAMUSCULAR | Status: AC
  Filled 2016-05-06: qty 1

## 2016-05-06 MED ORDER — MIDAZOLAM HCL 2 MG/2ML IJ SOLN
1.0000 mg | Freq: Once | INTRAMUSCULAR | Status: AC
  Administered 2016-05-06: 1 mg via INTRAVENOUS

## 2016-05-06 MED ORDER — ACETAMINOPHEN 325 MG PO TABS
650.0000 mg | ORAL_TABLET | Freq: Four times a day (QID) | ORAL | Status: DC | PRN
  Administered 2016-05-08: 650 mg via ORAL
  Filled 2016-05-06: qty 2

## 2016-05-06 MED ORDER — ROPIVACAINE HCL 7.5 MG/ML IJ SOLN
INTRAMUSCULAR | Status: DC | PRN
  Administered 2016-05-06: 20 mL via PERINEURAL

## 2016-05-06 MED ORDER — CANAGLIFLOZIN 300 MG PO TABS
300.0000 mg | ORAL_TABLET | Freq: Every day | ORAL | Status: DC
  Administered 2016-05-07: 11:00:00 300 mg via ORAL
  Filled 2016-05-06: qty 1

## 2016-05-06 MED ORDER — LACTATED RINGERS IV SOLN
INTRAVENOUS | Status: DC
  Administered 2016-05-06 (×3): via INTRAVENOUS

## 2016-05-06 MED ORDER — ACETAMINOPHEN 500 MG PO TABS
1000.0000 mg | ORAL_TABLET | Freq: Four times a day (QID) | ORAL | Status: AC
  Administered 2016-05-06 – 2016-05-07 (×4): 1000 mg via ORAL
  Filled 2016-05-06 (×4): qty 2

## 2016-05-06 MED ORDER — DOCUSATE SODIUM 100 MG PO CAPS
100.0000 mg | ORAL_CAPSULE | Freq: Two times a day (BID) | ORAL | Status: DC
  Administered 2016-05-06 – 2016-05-08 (×4): 100 mg via ORAL
  Filled 2016-05-06 (×4): qty 1

## 2016-05-06 MED ORDER — METOPROLOL SUCCINATE ER 50 MG PO TB24
100.0000 mg | ORAL_TABLET | Freq: Every day | ORAL | Status: DC
  Administered 2016-05-07 – 2016-05-08 (×2): 100 mg via ORAL
  Filled 2016-05-06 (×2): qty 2

## 2016-05-06 MED ORDER — DEXTROSE 5 % IV SOLN
500.0000 mg | Freq: Four times a day (QID) | INTRAVENOUS | Status: DC | PRN
  Filled 2016-05-06: qty 5

## 2016-05-06 MED ORDER — BUPIVACAINE LIPOSOME 1.3 % IJ SUSP
INTRAMUSCULAR | Status: DC | PRN
  Administered 2016-05-06: 20 mL

## 2016-05-06 MED ORDER — EZETIMIBE 10 MG PO TABS
10.0000 mg | ORAL_TABLET | Freq: Every day | ORAL | Status: DC
  Administered 2016-05-07 – 2016-05-08 (×2): 10 mg via ORAL
  Filled 2016-05-06 (×2): qty 1

## 2016-05-06 MED ORDER — BUPIVACAINE LIPOSOME 1.3 % IJ SUSP
20.0000 mL | Freq: Once | INTRAMUSCULAR | Status: DC
  Filled 2016-05-06: qty 20

## 2016-05-06 MED ORDER — SODIUM CHLORIDE 0.9 % IR SOLN
Status: DC | PRN
  Administered 2016-05-06: 1000 mL

## 2016-05-06 MED ORDER — INSULIN DETEMIR 100 UNIT/ML ~~LOC~~ SOLN
60.0000 [IU] | Freq: Every day | SUBCUTANEOUS | Status: DC
  Administered 2016-05-07: 60 [IU] via SUBCUTANEOUS
  Filled 2016-05-06 (×2): qty 0.6

## 2016-05-06 MED ORDER — MEPERIDINE HCL 50 MG/ML IJ SOLN: 6.2500 mg | INTRAMUSCULAR | Status: DC | PRN

## 2016-05-06 MED ORDER — DEXAMETHASONE SODIUM PHOSPHATE 10 MG/ML IJ SOLN
10.0000 mg | Freq: Once | INTRAMUSCULAR | Status: AC
  Administered 2016-05-06: 10 mg via INTRAVENOUS

## 2016-05-06 MED ORDER — DIPHENHYDRAMINE HCL 25 MG PO CAPS: 25.0000 mg | ORAL_CAPSULE | ORAL | Status: DC | PRN

## 2016-05-06 MED ORDER — AMLODIPINE BESYLATE 5 MG PO TABS
5.0000 mg | ORAL_TABLET | Freq: Every day | ORAL | Status: DC
  Administered 2016-05-07 – 2016-05-08 (×2): 5 mg via ORAL
  Filled 2016-05-06 (×2): qty 1

## 2016-05-06 MED ORDER — CEFAZOLIN SODIUM-DEXTROSE 2-4 GM/100ML-% IV SOLN
2.0000 g | INTRAVENOUS | Status: AC
  Administered 2016-05-06: 2 g via INTRAVENOUS
  Filled 2016-05-06: qty 100

## 2016-05-06 MED ORDER — EPHEDRINE SULFATE 50 MG/ML IJ SOLN
INTRAMUSCULAR | Status: DC | PRN
  Administered 2016-05-06: 10 mg via INTRAVENOUS

## 2016-05-06 MED ORDER — SODIUM CHLORIDE 0.9 % IV SOLN
INTRAVENOUS | Status: DC
  Administered 2016-05-06: 18:00:00 via INTRAVENOUS

## 2016-05-06 MED ORDER — HYDROMORPHONE HCL 1 MG/ML IJ SOLN: 0.2500 mg | INTRAMUSCULAR | Status: DC | PRN

## 2016-05-06 MED ORDER — ACETAMINOPHEN 10 MG/ML IV SOLN
INTRAVENOUS | Status: AC
  Filled 2016-05-06: qty 100

## 2016-05-06 MED ORDER — ROPIVACAINE HCL 7.5 MG/ML IJ SOLN
INTRAMUSCULAR | Status: AC
  Filled 2016-05-06: qty 20

## 2016-05-06 MED ORDER — ACETAMINOPHEN 10 MG/ML IV SOLN
1000.0000 mg | Freq: Once | INTRAVENOUS | Status: AC
  Administered 2016-05-06: 1000 mg via INTRAVENOUS
  Filled 2016-05-06: qty 100

## 2016-05-06 MED ORDER — CEFAZOLIN SODIUM-DEXTROSE 2-4 GM/100ML-% IV SOLN
INTRAVENOUS | Status: AC
  Filled 2016-05-06: qty 100

## 2016-05-06 MED ORDER — FENTANYL CITRATE (PF) 100 MCG/2ML IJ SOLN
INTRAMUSCULAR | Status: AC
  Filled 2016-05-06: qty 2

## 2016-05-06 MED ORDER — COLESEVELAM HCL 625 MG PO TABS
1875.0000 mg | ORAL_TABLET | Freq: Two times a day (BID) | ORAL | Status: DC
  Administered 2016-05-06 – 2016-05-08 (×4): 1875 mg via ORAL
  Filled 2016-05-06 (×4): qty 3

## 2016-05-06 MED ORDER — BUPIVACAINE IN DEXTROSE 0.75-8.25 % IT SOLN
INTRATHECAL | Status: DC | PRN
  Administered 2016-05-06: 2 mL via INTRATHECAL

## 2016-05-06 MED ORDER — CEFAZOLIN SODIUM-DEXTROSE 2-4 GM/100ML-% IV SOLN
2.0000 g | Freq: Four times a day (QID) | INTRAVENOUS | Status: AC
  Administered 2016-05-06 – 2016-05-07 (×2): 2 g via INTRAVENOUS
  Filled 2016-05-06 (×2): qty 100

## 2016-05-06 MED ORDER — STERILE WATER FOR IRRIGATION IR SOLN
Status: DC | PRN
  Administered 2016-05-06: 2000 mL

## 2016-05-06 MED ORDER — PANTOPRAZOLE SODIUM 40 MG PO TBEC
40.0000 mg | DELAYED_RELEASE_TABLET | Freq: Every day | ORAL | Status: DC
  Administered 2016-05-07 – 2016-05-08 (×2): 40 mg via ORAL
  Filled 2016-05-06 (×2): qty 1

## 2016-05-06 MED ORDER — ONDANSETRON HCL 4 MG/2ML IJ SOLN
INTRAMUSCULAR | Status: AC
  Filled 2016-05-06: qty 2

## 2016-05-06 MED ORDER — SODIUM CHLORIDE 0.9 % IJ SOLN
INTRAMUSCULAR | Status: AC
  Filled 2016-05-06: qty 50

## 2016-05-06 MED ORDER — PROMETHAZINE HCL 25 MG/ML IJ SOLN: 6.2500 mg | INTRAMUSCULAR | Status: DC | PRN

## 2016-05-06 MED ORDER — MORPHINE SULFATE (PF) 2 MG/ML IV SOLN: 1.0000 mg | INTRAVENOUS | Status: DC | PRN

## 2016-05-06 MED ORDER — FLEET ENEMA 7-19 GM/118ML RE ENEM: 1.0000 | ENEMA | Freq: Once | RECTAL | Status: DC | PRN

## 2016-05-06 MED ORDER — PHENOL 1.4 % MT LIQD: 1.0000 | OROMUCOSAL | Status: DC | PRN

## 2016-05-06 MED ORDER — PREGABALIN 100 MG PO CAPS
300.0000 mg | ORAL_CAPSULE | Freq: Two times a day (BID) | ORAL | Status: DC
  Administered 2016-05-06 – 2016-05-08 (×4): 300 mg via ORAL
  Filled 2016-05-06 (×4): qty 3

## 2016-05-06 MED ORDER — RIVAROXABAN 10 MG PO TABS
10.0000 mg | ORAL_TABLET | Freq: Every day | ORAL | Status: DC
  Administered 2016-05-07 – 2016-05-08 (×2): 10 mg via ORAL
  Filled 2016-05-06 (×2): qty 1

## 2016-05-06 MED ORDER — LACTATED RINGERS IV SOLN
INTRAVENOUS | Status: DC
Start: 2016-05-06 — End: 2016-05-06
  Administered 2016-05-06: 13:00:00 via INTRAVENOUS

## 2016-05-06 MED ORDER — PROPOFOL 10 MG/ML IV BOLUS
INTRAVENOUS | Status: DC | PRN
  Administered 2016-05-06 (×2): 20 mg via INTRAVENOUS

## 2016-05-06 MED ORDER — METOCLOPRAMIDE HCL 5 MG/ML IJ SOLN: 5.0000 mg | Freq: Three times a day (TID) | INTRAMUSCULAR | Status: DC | PRN

## 2016-05-06 MED ORDER — SODIUM CHLORIDE 0.9 % IJ SOLN
INTRAMUSCULAR | Status: DC | PRN
  Administered 2016-05-06: 30 mL

## 2016-05-06 MED ORDER — ONDANSETRON HCL 4 MG/2ML IJ SOLN
INTRAMUSCULAR | Status: DC | PRN
  Administered 2016-05-06: 4 mg via INTRAVENOUS

## 2016-05-06 MED ORDER — ACETAMINOPHEN 650 MG RE SUPP: 650.0000 mg | Freq: Four times a day (QID) | RECTAL | Status: DC | PRN

## 2016-05-06 MED ORDER — ONDANSETRON HCL 4 MG PO TABS: 4.0000 mg | ORAL_TABLET | Freq: Four times a day (QID) | ORAL | Status: DC | PRN

## 2016-05-06 MED ORDER — BISACODYL 10 MG RE SUPP: 10.0000 mg | Freq: Every day | RECTAL | Status: DC | PRN

## 2016-05-06 MED ORDER — MENTHOL 3 MG MT LOZG: 1.0000 | LOZENGE | OROMUCOSAL | Status: DC | PRN

## 2016-05-06 MED ORDER — CHLORHEXIDINE GLUCONATE 4 % EX LIQD: 60.0000 mL | Freq: Once | CUTANEOUS | Status: DC

## 2016-05-06 MED ORDER — OXYCODONE HCL 5 MG PO TABS
5.0000 mg | ORAL_TABLET | ORAL | Status: DC | PRN
  Administered 2016-05-06: 10 mg via ORAL
  Administered 2016-05-06: 18:00:00 5 mg via ORAL
  Administered 2016-05-07 – 2016-05-08 (×10): 10 mg via ORAL
  Filled 2016-05-06: qty 1
  Filled 2016-05-06 (×11): qty 2

## 2016-05-06 MED ORDER — PROPOFOL 500 MG/50ML IV EMUL
INTRAVENOUS | Status: DC | PRN
  Administered 2016-05-06: 75 ug/kg/min via INTRAVENOUS

## 2016-05-06 MED ORDER — BUPIVACAINE HCL (PF) 0.25 % IJ SOLN
INTRAMUSCULAR | Status: AC
  Filled 2016-05-06: qty 30

## 2016-05-06 MED ORDER — EPHEDRINE 5 MG/ML INJ
INTRAVENOUS | Status: AC
  Filled 2016-05-06: qty 10

## 2016-05-06 MED ORDER — METHOCARBAMOL 500 MG PO TABS
500.0000 mg | ORAL_TABLET | Freq: Four times a day (QID) | ORAL | Status: DC | PRN
  Administered 2016-05-06 – 2016-05-08 (×4): 500 mg via ORAL
  Filled 2016-05-06 (×4): qty 1

## 2016-05-06 MED ORDER — LIRAGLUTIDE 18 MG/3ML ~~LOC~~ SOPN: 1.8000 mg | PEN_INJECTOR | Freq: Every day | SUBCUTANEOUS | Status: DC

## 2016-05-06 MED ORDER — TRANEXAMIC ACID 1000 MG/10ML IV SOLN
INTRAVENOUS | Status: DC | PRN
  Administered 2016-05-06: 2000 mg via TOPICAL

## 2016-05-06 MED ORDER — POLYETHYLENE GLYCOL 3350 17 G PO PACK
17.0000 g | PACK | Freq: Every day | ORAL | Status: DC | PRN
Start: 2016-05-06 — End: 2016-05-08

## 2016-05-06 SURGICAL SUPPLY — 49 items
BAG DECANTER FOR FLEXI CONT (MISCELLANEOUS) ×3 IMPLANT
BAG ZIPLOCK 12X15 (MISCELLANEOUS) ×3 IMPLANT
BANDAGE ACE 6X5 VEL STRL LF (GAUZE/BANDAGES/DRESSINGS) ×3 IMPLANT
BLADE SAG 18X100X1.27 (BLADE) ×3 IMPLANT
BLADE SAW SGTL 11.0X1.19X90.0M (BLADE) ×3 IMPLANT
BOWL SMART MIX CTS (DISPOSABLE) ×3 IMPLANT
CAP KNEE TOTAL 3 SIGMA ×3 IMPLANT
CEMENT HV SMART SET (Cement) ×6 IMPLANT
CLOSURE WOUND 1/2 X4 (GAUZE/BANDAGES/DRESSINGS) ×2
CLOTH BEACON ORANGE TIMEOUT ST (SAFETY) ×3 IMPLANT
CUFF TOURN SGL QUICK 34 (TOURNIQUET CUFF) ×2
CUFF TRNQT CYL 34X4X40X1 (TOURNIQUET CUFF) ×1 IMPLANT
DECANTER SPIKE VIAL GLASS SM (MISCELLANEOUS) ×3 IMPLANT
DRAPE U-SHAPE 47X51 STRL (DRAPES) ×3 IMPLANT
DRSG ADAPTIC 3X8 NADH LF (GAUZE/BANDAGES/DRESSINGS) ×3 IMPLANT
DURAPREP 26ML APPLICATOR (WOUND CARE) ×3 IMPLANT
ELECT REM PT RETURN 9FT ADLT (ELECTROSURGICAL) ×3
ELECTRODE REM PT RTRN 9FT ADLT (ELECTROSURGICAL) ×1 IMPLANT
EVACUATOR 1/8 PVC DRAIN (DRAIN) ×3 IMPLANT
GAUZE SPONGE 4X4 12PLY STRL (GAUZE/BANDAGES/DRESSINGS) ×3 IMPLANT
GLOVE BIO SURGEON STRL SZ7.5 (GLOVE) ×3 IMPLANT
GLOVE BIO SURGEON STRL SZ8 (GLOVE) ×3 IMPLANT
GLOVE BIOGEL PI IND STRL 7.5 (GLOVE) ×4 IMPLANT
GLOVE BIOGEL PI IND STRL 8 (GLOVE) ×1 IMPLANT
GLOVE BIOGEL PI INDICATOR 7.5 (GLOVE) ×8
GLOVE BIOGEL PI INDICATOR 8 (GLOVE) ×2
GLOVE SURG SS PI 7.0 STRL IVOR (GLOVE) ×3 IMPLANT
GLOVE SURG SS PI 7.5 STRL IVOR (GLOVE) ×3 IMPLANT
GOWN STRL REUS W/ TWL XL LVL3 (GOWN DISPOSABLE) ×1 IMPLANT
GOWN STRL REUS W/TWL LRG LVL3 (GOWN DISPOSABLE) ×3 IMPLANT
GOWN STRL REUS W/TWL XL LVL3 (GOWN DISPOSABLE) ×8 IMPLANT
HANDPIECE INTERPULSE COAX TIP (DISPOSABLE) ×2
IMMOBILIZER KNEE 20 (SOFTGOODS) ×3
IMMOBILIZER KNEE 20 THIGH 36 (SOFTGOODS) ×1 IMPLANT
MANIFOLD NEPTUNE II (INSTRUMENTS) ×3 IMPLANT
PACK TOTAL KNEE CUSTOM (KITS) ×3 IMPLANT
PAD ABD 8X10 STRL (GAUZE/BANDAGES/DRESSINGS) ×3 IMPLANT
PADDING CAST COTTON 6X4 STRL (CAST SUPPLIES) ×9 IMPLANT
POSITIONER SURGICAL ARM (MISCELLANEOUS) ×3 IMPLANT
SET HNDPC FAN SPRY TIP SCT (DISPOSABLE) ×1 IMPLANT
STRIP CLOSURE SKIN 1/2X4 (GAUZE/BANDAGES/DRESSINGS) ×4 IMPLANT
SUT MNCRL AB 4-0 PS2 18 (SUTURE) ×3 IMPLANT
SUT VIC AB 2-0 CT1 27 (SUTURE) ×6
SUT VIC AB 2-0 CT1 TAPERPNT 27 (SUTURE) ×3 IMPLANT
SUT VLOC 180 0 24IN GS25 (SUTURE) ×3 IMPLANT
SYR 50ML LL SCALE MARK (SYRINGE) ×3 IMPLANT
TRAY FOLEY CATH 16FR SILVER (SET/KITS/TRAYS/PACK) ×3 IMPLANT
WRAP KNEE MAXI GEL POST OP (GAUZE/BANDAGES/DRESSINGS) ×3 IMPLANT
YANKAUER SUCT BULB TIP 10FT TU (MISCELLANEOUS) ×3 IMPLANT

## 2016-05-06 NOTE — Op Note (Addendum)
OPERATIVE REPORT-TOTAL KNEE ARTHROPLASTY   Pre-operative diagnosis- Osteoarthritis  Left knee(s)  Post-operative diagnosis- Osteoarthritis Left knee(s)  Procedure-  Left  Total Knee Arthroplasty  Surgeon- Dione Plover. Cowen Pesqueira, MD  Assistant- Arlee Muslim, PA-C   Anesthesia-  Adductor canal block and spinal  EBL-* No blood loss amount entered *   Drains Hemovac  Tourniquet time-  Total Tourniquet Time Documented: Thigh (Left) - 41 minutes Total: Thigh (Left) - 41 minutes     Complications- None  Condition-PACU - hemodynamically stable.   Brief Clinical Note   Benjamin Aguilar is a 65 y.o. year old male with end stage OA of his left knee with progressively worsening pain and dysfunction. He has constant pain, with activity and at rest and significant functional deficits with difficulties even with ADLs. He has had extensive non-op management including analgesics, injections of cortisone and viscosupplements, and home exercise program, but remains in significant pain with significant dysfunction. Radiographs show bone on bone arthritis medial and patellofemoral. He presents now for left Total Knee Arthroplasty.     Procedure in detail---   The patient is brought into the operating room and positioned supine on the operating table. After successful administration of  Adductor canal block and spinal,   a tourniquet is placed high on the  Left thigh(s) and the lower extremity is prepped and draped in the usual sterile fashion. Time out is performed by the operating team and then the  Left lower extremity is wrapped in Esmarch, knee flexed and the tourniquet inflated to 300 mmHg.       A midline incision is made with a ten blade through the subcutaneous tissue to the level of the extensor mechanism. A fresh blade is used to make a medial parapatellar arthrotomy. Soft tissue over the proximal medial tibia is subperiosteally elevated to the joint line with a knife and into the  semimembranosus bursa with a Cobb elevator. Soft tissue over the proximal lateral tibia is elevated with attention being paid to avoiding the patellar tendon on the tibial tubercle. The patella is everted, knee flexed 90 degrees and the ACL and PCL are removed. Findings are bone on bone medial and patellofemoral with large global osteophytes.        The drill is used to create a starting hole in the distal femur and the canal is thoroughly irrigated with sterile saline to remove the fatty contents. The 5 degree Left  valgus alignment guide is placed into the femoral canal and the distal femoral cutting block is pinned to remove 10 mm off the distal femur. Resection is made with an oscillating saw.      The tibia is subluxed forward and the menisci are removed. The extramedullary alignment guide is placed referencing proximally at the medial aspect of the tibial tubercle and distally along the second metatarsal axis and tibial crest. The block is pinned to remove 45mm off the more deficient medial  side. Resection is made with an oscillating saw. Size 4is the most appropriate size for the tibia and the proximal tibia is prepared with the modular drill and keel punch for that size.      The femoral sizing guide is placed and size 4 is most appropriate. Rotation is marked off the epicondylar axis and confirmed by creating a rectangular flexion gap at 90 degrees. The size 4 cutting block is pinned in this rotation and the anterior, posterior and chamfer cuts are made with the oscillating saw. The intercondylar block is then  placed and that cut is made.      Trial size 4 tibial component, trial size 4 posterior stabilized femur and a 12.5  mm posterior stabilized rotating platform insert trial is placed. Full extension is achieved with excellent varus/valgus and anterior/posterior balance throughout full range of motion. The patella is everted and thickness measured to be 27  mm. Free hand resection is taken to 15 mm,  a 41 template is placed, lug holes are drilled, trial patella is placed, and it tracks normally. Osteophytes are removed off the posterior femur with the trial in place. All trials are removed and the cut bone surfaces prepared with pulsatile lavage. Cement is mixed and once ready for implantation, the size 4 tibial implant, size  4 posterior stabilized femoral component, and the size 41 patella are cemented in place and the patella is held with the clamp. The trial insert is placed and the knee held in full extension. The Exparel (20 ml mixed with 30 ml saline) is injected into the extensor mechanism, posterior capsule, medial and lateral gutters and subcutaneous tissues.  All extruded cement is removed and once the cement is hard the permanent 12.5 mm posterior stabilized rotating platform insert is placed into the tibial tray.      The wound is copiously irrigated with saline solution and the extensor mechanism closed over a hemovac drain with #1 V-loc suture. The tourniquet is released for a total tourniquet time of 41  minutes. Flexion against gravity is 140 degrees and the patella tracks normally. Subcutaneous tissue is closed with 2.0 vicryl and subcuticular with running 4.0 Monocryl. The incision is cleaned and dried and steri-strips and a bulky sterile dressing are applied. The limb is placed into a knee immobilizer and the patient is awakened and transported to recovery in stable condition.      Please note that a surgical assistant was a medical necessity for this procedure in order to perform it in a safe and expeditious manner. Surgical assistant was necessary to retract the ligaments and vital neurovascular structures to prevent injury to them and also necessary for proper positioning of the limb to allow for anatomic placement of the prosthesis.   Dione Plover Zygmund Passero, MD    05/06/2016, 2:48 PM

## 2016-05-06 NOTE — Transfer of Care (Signed)
Immediate Anesthesia Transfer of Care Note  Patient: Benjamin Aguilar  Procedure(s) Performed: Procedure(s) with comments: LEFT TOTAL KNEE ARTHROPLASTY (Left) - Adductor Block  Patient Location: PACU  Anesthesia Type:Spinal  Level of Consciousness: awake, alert  and oriented  Airway & Oxygen Therapy: Patient Spontanous Breathing and Patient connected to face mask oxygen  Post-op Assessment: Report given to RN and Post -op Vital signs reviewed and stable  Post vital signs: Reviewed and stable  Last Vitals:  Vitals:   05/06/16 1320 05/06/16 1325  BP: (!) 98/55 (!) 87/58  Pulse: (!) 55 (!) 57  Resp: 12 12  Temp:      Last Pain:  Vitals:   05/06/16 1102  TempSrc: Oral         Complications: No apparent anesthesia complications

## 2016-05-06 NOTE — Anesthesia Procedure Notes (Signed)
Anesthesia Regional Block:  Adductor canal block  Pre-Anesthetic Checklist: ,, timeout performed, Correct Patient, Correct Site, Correct Laterality, Correct Procedure, Correct Position, site marked, Risks and benefits discussed,  Surgical consent,  Pre-op evaluation,  At surgeon's request and post-op pain management  Laterality: Left and Lower  Prep: chloraprep       Needles:  Injection technique: Single-shot  Needle Type: Echogenic Needle     Needle Length: 9cm 9 cm Needle Gauge: 21 and 21 G    Additional Needles:  Procedures: ultrasound guided (picture in chart) Adductor canal block Narrative:  Start time: 05/06/2016 1:02 PM End time: 05/06/2016 1:08 PM Injection made incrementally with aspirations every 5 mL.  Performed by: Personally  Anesthesiologist: Glennon Mac, Maricus Tanzi  Additional Notes: Pt identified in Holding room.  Monitors applied. Working IV access confirmed. Sterile prep, drape L thigh.  #22ga ECHOgenic needle into adductor canal with US guidance.  20cc 0.75% Ropivacaine injected incrementally after negative test dose.  Patient asymptomatic, VSS, no heme aspirated, tolerated well.  Jenita Seashore, MD

## 2016-05-06 NOTE — H&P (View-Only) (Signed)
Benjamin Aguilar DOB: 1951/02/19 Married / Language: English / Race: White Male Date of Admission:  05/06/2016 CC:  Left Knee Pain History of Present Illness The patient is a 65 year old male who comes in for a preoperative History and Physical. The patient is scheduled for a left total knee arthroplasty to be performed by Dr. Dione Plover. [name], MD at [facility] on 05-06-2016. The patient is a 64 year old male who presented with knee complaints. The patient was seen for a second opinion. The patient reports left knee symptoms including: pain which began 1 year(s) ago without any known injury.The patient feels that the symptoms are worsening. The patient has the current diagnosis of knee osteoarthritis. Prior to being seen, the patient was previously evaluated by a colleague (Dr. Samule Dry in [place], as well as his PCP). Previous work-up for this problem has included knee x-rays and knee MRI. Past treatment for this problem has included intra-articular injection of corticosteroids (with aspiration; last injection has been about six months ago. He reports he only got minimal relief with the injection) and nonsteroidal anti-inflammatory drugs (meloxicam). Current treatment includes nonsteroidal anti-inflammatory drugs (ibprofen, prn) and non-opioid analgesics (topical analgesics). He states that he was told that he really needs to have the knee replaced. He is having pain with weightbearing and starting to have some soreness at rest. Unfortunately, his left knee is getting progressively worse. It is hurting at all times. It is limiting what he can and cannot do. The knee does not swell. Occasionally, he feels like it wants to give out. It has not locked on him. It is starting to hurt him at night. He has had injections of cortisone without any benefit. He is not interested in any further injections. He would like a more permanent solution to this. He is ready to get the knee fixed. They have been  treated conservatively in the past for the above stated problem and despite conservative measures, they continue to have progressive pain and severe functional limitations and dysfunction. They have failed non-operative management including home exercise, medications, and injections. It is felt that they would benefit from undergoing total joint replacement. Risks and benefits of the procedure have been discussed with the patient and they elect to proceed with surgery. There are no active contraindications to surgery such as ongoing infection or rapidly progressive neurological disease.  Problem List/Past Medical Primary osteoarthritis of left knee (M17.12)  Crohn's Disease  Depression  Diabetes Mellitus, Type II  Hepatitis C  High blood pressure  Hypercholesterolemia  Hypothyroidism  Liver disease  Peripheral Neuropathy  Prostate Cancer  Prostate Disease  Tinnitus  Liver Cirrhosis  Alcoholism   Allergies No Known Drug Allergies   Family History  Cancer  Father. Cerebrovascular Accident  Mother. Chronic Obstructive Lung Disease  Father. Depression  Brother. Diabetes Mellitus  Father. Hypertension  Father. Osteoarthritis  Mother.  Social History  Children  2 Current work status  retired Furniture conservator/restorer rarely; does running / walking and gym / [employer] Former drinker  02/22/2016: In the past drank; he is a recovering alcoholic History of drug/alcohol rehab  he is a recovering alcoholic Living situation  live with parents Marital status  divorced Not under pain contract  Number of flights of stairs before winded  2-3 Tobacco / smoke exposure  02/22/2016: no Tobacco use  Former smoker. 02/22/2016: smoke(d) 1 pack(s) per day  Medication History  Multivitamin (Oral) Active. Vitamin D3 (1000UNIT Tablet, Oral) Active. Aspirin (81MG  Tablet, Oral)  Active. Omeprazole (20MG  Tablet DR, Oral) Active. Zetia (10MG  Tablet, Oral)  Active. Levothyroxine Sodium (112MCG Tablet, Oral) Active. Metoprolol Succinate (100MG  Tablet ER, Oral) Active. AmLODIPine Besylate (5MG  Tablet, Oral) Active. Meloxicam (15MG  Tablet, Oral) Active. Lisinopril (40MG  Tablet, Oral) Active. Chantix (1MG  Tablet, Oral) Active. MetFORMIN HCl (1000MG  Tablet, Oral) Active. GlipiZIDE (10MG  Tablet, Oral) Active. Levemir FlexTouch (100UNIT/ML Soln Pen-inj, Subcutaneous) Active. NovoLOG FlexPen (100UNIT/ML Solution, Subcutaneous) Active. Lyrica (300MG  Capsule, Oral) Active. Invokana (300MG  Tablet, Oral) Active. Lyrica (50MG  Capsule, Oral) Active. Victoza (18MG /3ML Soln Pen-inj, Subcutaneous) Active. Welchol (625MG  Tablet, Oral) Active.  Past Surgical History Vasectomy  Umbilical Hernia Surgery  Date: 20. Prostate Seeding  Date: 2008.   Review of Systems General Not Present- Chills, Fatigue, Fever, Memory Loss, Night Sweats, Weight Gain and Weight Loss. Skin Not Present- Eczema, Hives, Itching, Lesions and Rash. HEENT Present- Tinnitus. Not Present- Dentures, Double Vision, Headache, Hearing Loss and Visual Loss. Respiratory Present- Shortness of breath with exertion. Not Present- Allergies, Chronic Cough, Coughing up blood and Shortness of breath at rest. Cardiovascular Not Present- Chest Pain, Difficulty Breathing Lying Down, Murmur, Palpitations, Racing/skipping heartbeats and Swelling. Gastrointestinal Not Present- Abdominal Pain, Bloody Stool, Constipation, Diarrhea, Difficulty Swallowing, Heartburn, Jaundice, Loss of appetitie, Nausea and Vomiting. Male Genitourinary Present- Urinating at Night. Not Present- Blood in Urine, Discharge, Flank Pain, Incontinence, Painful Urination, Urgency, Urinary frequency, Urinary Retention and Weak urinary stream. Musculoskeletal Not Present- Back Pain, Joint Pain, Joint Swelling, Morning Stiffness, Muscle Pain, Muscle Weakness and Spasms. Neurological Not Present- Blackout spells,  Difficulty with balance, Dizziness, Paralysis, Tremor and Weakness. Psychiatric Not Present- Insomnia.  Vitals  Weight: 238 lb Height: 69in Weight was reported by patient. Height was reported by patient. Body Surface Area: 2.22 m Body Mass Index: 35.15 kg/m  Pulse: 64 (Regular)  BP: 122/66 (Sitting, Right Arm, Standard)  Physical Exam  General Mental Status -Alert, cooperative and good historian. General Appearance-pleasant, Not in acute distress. Orientation-Oriented X3. Build & Nutrition-Well nourished and Well developed.  Head and Neck Head-normocephalic, atraumatic . Neck Global Assessment - supple, no bruit auscultated on the right, no bruit auscultated on the left.  Eye Vision-Wears corrective lenses. Pupil - Bilateral-Regular and Round. Motion - Bilateral-EOMI.  ENMT Note: upper partial denture   Chest and Lung Exam Auscultation Breath sounds - clear at anterior chest wall and clear at posterior chest wall. Adventitious sounds - No Adventitious sounds.  Cardiovascular Auscultation Rhythm - Regular rate and rhythm. Heart Sounds - S1 WNL and S2 WNL. Murmurs & Other Heart Sounds - Auscultation of the heart reveals - No Murmurs.  Abdomen Inspection Contour - Generalized moderate distention. Palpation/Percussion Tenderness - Abdomen is non-tender to palpation. Rigidity (guarding) - Abdomen is soft. Auscultation Auscultation of the abdomen reveals - Bowel sounds normal.  Male Genitourinary Note: Not done, not pertinent to present illness   Musculoskeletal Note: He is alert and oriented, in no apparent distress. Evaluation of his hips, normal range of motion with no discomfort. His right knee shows no effusion. His range is about 0 to 130. Slight tenderness medially, no lateral tenderness or instability. Left knee varus deformity, range 5 to 125, marked crepitus on range of motion. Tender medial greater than lateral with no  instability noted. He has an antalgic gait on the left. Pulse, sensation, motor intact.  RADIOGRAPHS AP of both knees, lateral of the left show bone on bone arthritis medial and patellofemoral on the left. He also has arthritic change in the right, but not as bad.  Assessment &  Plan Primary osteoarthritis of left knee (M17.12)  Note:Surgical Plans: Left Total Knee Replacement  Disposition: Home and straight to outpatient therapy  PCP: Dr. Ilda Basset - Patient has been seen preoperatively and felt to be stable for surgery. "The patient has no medical contraindications for planned left total knee replacement by Dr. Gaynelle Arabian on December 18th, 2017." Cardiology: Dr. Danie Binder - Patient has been seen preoperatively and felt to be stable for surgery. "Patient is low risk for knee surgery and may proceed".  Topical TXA  Anesthesia Issues: None  Signed electronically by Ok Edwards, III PA-C

## 2016-05-06 NOTE — Anesthesia Postprocedure Evaluation (Signed)
Anesthesia Post Note  Patient: Benjamin Aguilar  Procedure(s) Performed: Procedure(s) (LRB): LEFT TOTAL KNEE ARTHROPLASTY (Left)  Patient location during evaluation: PACU Anesthesia Type: Regional and Spinal Level of consciousness: awake and alert, oriented and patient cooperative Pain management: pain level controlled Vital Signs Assessment: post-procedure vital signs reviewed and stable Respiratory status: spontaneous breathing, patient connected to nasal cannula oxygen, nonlabored ventilation and respiratory function stable Cardiovascular status: blood pressure returned to baseline and stable Postop Assessment: spinal receding and no signs of nausea or vomiting Anesthetic complications: no       Last Vitals:  Vitals:   05/06/16 1600 05/06/16 1610  BP: 95/60 103/61  Pulse: (!) 54 (!) 54  Resp: 11 10  Temp:  36.5 C    Last Pain:  Vitals:   05/06/16 1102  TempSrc: Oral                 Dalexa Gentz,E. Natayah Warmack

## 2016-05-06 NOTE — Interval H&P Note (Signed)
History and Physical Interval Note:  05/06/2016 12:52 PM  Benjamin Aguilar  has presented today for surgery, with the diagnosis of left knee osteoarthritis  The various methods of treatment have been discussed with the patient and family. After consideration of risks, benefits and other options for treatment, the patient has consented to  Procedure(s): LEFT TOTAL KNEE ARTHROPLASTY (Left) as a surgical intervention .  The patient's history has been reviewed, patient examined, no change in status, stable for surgery.  I have reviewed the patient's chart and labs.  Questions were answered to the patient's satisfaction.     Gearlean Alf

## 2016-05-06 NOTE — Anesthesia Preprocedure Evaluation (Addendum)
Anesthesia Evaluation  Patient identified by MRN, date of birth, ID band Patient awake    Reviewed: Allergy & Precautions, NPO status , Patient's Chart, lab work & pertinent test results, reviewed documented beta blocker date and time   History of Anesthesia Complications Negative for: history of anesthetic complications  Airway Mallampati: II  TM Distance: >3 FB Neck ROM: Full    Dental  (+) Teeth Intact, Caps, Dental Advisory Given   Pulmonary sleep apnea (no longer uses his CPAP (lost weight)) , COPD,  COPD inhaler, former smoker (quit 8/17),    breath sounds clear to auscultation       Cardiovascular hypertension, Pt. on medications and Pt. on home beta blockers (-) angina Rhythm:Regular Rate:Bradycardia  10/17 stress test: negative. Measured EF heart function was borderline or perhaps mildly depressed, may be an underestimate.   Neuro/Psych negative neurological ROS     GI/Hepatic GERD  Medicated and Controlled,(+)     substance abuse  alcohol use, Hepatitis - (s/p Harvoni), C  Endo/Other  diabetes (glu 121), Oral Hypoglycemic Agents, Insulin DependentHypothyroidism   Renal/GU negative Renal ROS   H/o prostate cancer    Musculoskeletal   Abdominal (+) + obese,   Peds  Hematology   Anesthesia Other Findings   Reproductive/Obstetrics                            Anesthesia Physical Anesthesia Plan  ASA: III  Anesthesia Plan: Spinal   Post-op Pain Management:  Regional for Post-op pain   Induction:   Airway Management Planned: Natural Airway and Simple Face Mask  Additional Equipment:   Intra-op Plan:   Post-operative Plan:   Informed Consent: I have reviewed the patients History and Physical, chart, labs and discussed the procedure including the risks, benefits and alternatives for the proposed anesthesia with the patient or authorized representative who has indicated his/her  understanding and acceptance.   Dental advisory given  Plan Discussed with: CRNA and Surgeon  Anesthesia Plan Comments: (Plan routine monitors, SAB with adductor canal block for post op analgesia)        Anesthesia Quick Evaluation

## 2016-05-06 NOTE — Anesthesia Procedure Notes (Signed)
Spinal  Patient location during procedure: OR Start time: 05/06/2016 1:33 PM End time: 05/06/2016 1:40 PM Reason for block: at surgeon's request Staffing Resident/CRNA: Anne Fu Performed: resident/CRNA  Preanesthetic Checklist Completed: patient identified, site marked, surgical consent, pre-op evaluation, timeout performed, IV checked, risks and benefits discussed and monitors and equipment checked Spinal Block Patient position: sitting Prep: DuraPrep Patient monitoring: heart rate, continuous pulse ox and blood pressure Approach: right paramedian Location: L2-3 Injection technique: single-shot Needle Needle type: Pencan  Needle gauge: 24 G Needle length: 9 cm Assessment Sensory level: T6 Additional Notes Expiration date of kit checked and confirmed. Patient tolerated procedure well, without complications. X 1 attempt with noted clear CSF return. Loss of motor and sensory on exam post injection.

## 2016-05-06 NOTE — Progress Notes (Signed)
AssistedDr. Jackson with left, ultrasound guided, adductor canal block. Side rails up, monitors on throughout procedure. See vital signs in flow sheet. Tolerated Procedure well.  

## 2016-05-07 ENCOUNTER — Encounter (HOSPITAL_COMMUNITY): Payer: Self-pay | Admitting: Orthopedic Surgery

## 2016-05-07 LAB — CBC
HCT: 42 % (ref 39.0–52.0)
Hemoglobin: 13.8 g/dL (ref 13.0–17.0)
MCH: 27.8 pg (ref 26.0–34.0)
MCHC: 32.9 g/dL (ref 30.0–36.0)
MCV: 84.7 fL (ref 78.0–100.0)
PLATELETS: 105 10*3/uL — AB (ref 150–400)
RBC: 4.96 MIL/uL (ref 4.22–5.81)
RDW: 14.4 % (ref 11.5–15.5)
WBC: 7.2 10*3/uL (ref 4.0–10.5)

## 2016-05-07 LAB — BASIC METABOLIC PANEL
Anion gap: 5 (ref 5–15)
BUN: 21 mg/dL — AB (ref 6–20)
CO2: 22 mmol/L (ref 22–32)
Calcium: 8.5 mg/dL — ABNORMAL LOW (ref 8.9–10.3)
Chloride: 111 mmol/L (ref 101–111)
Creatinine, Ser: 0.92 mg/dL (ref 0.61–1.24)
GFR calc Af Amer: >60 mL/min (ref >60)
GLUCOSE: 205 mg/dL — AB (ref 65–99)
POTASSIUM: 5.3 mmol/L — AB (ref 3.5–5.1)
Sodium: 138 mmol/L (ref 135–145)

## 2016-05-07 LAB — GLUCOSE, CAPILLARY
GLUCOSE-CAPILLARY: 132 mg/dL — AB (ref 65–99)
Glucose-Capillary: 187 mg/dL — ABNORMAL HIGH (ref 65–99)

## 2016-05-07 MED ORDER — INSULIN ASPART 100 UNIT/ML ~~LOC~~ SOLN: 0.0000 [IU] | Freq: Three times a day (TID) | SUBCUTANEOUS | Status: DC

## 2016-05-07 MED ORDER — OXYCODONE HCL 5 MG PO TABS: 5.0000 mg | ORAL_TABLET | ORAL | 0 refills | Status: DC | PRN

## 2016-05-07 MED ORDER — TRAMADOL HCL 50 MG PO TABS: 50.0000 mg | ORAL_TABLET | Freq: Four times a day (QID) | ORAL | 1 refills | Status: DC | PRN

## 2016-05-07 MED ORDER — RIVAROXABAN 10 MG PO TABS: 10.0000 mg | ORAL_TABLET | Freq: Every day | ORAL | 0 refills | Status: DC

## 2016-05-07 MED ORDER — METHOCARBAMOL 500 MG PO TABS: 500.0000 mg | ORAL_TABLET | Freq: Four times a day (QID) | ORAL | 0 refills | Status: DC | PRN

## 2016-05-07 NOTE — Care Management Note (Signed)
Case Management Note  Patient Details  Name: Benjamin Aguilar MRN: [id number] Date of Birth: 20-Nov-1950  Subjective/Objective:                  Left  Total Knee Arthroplasty Action/Plan: Discharge planning Expected Discharge Date:  05/08/16               Expected Discharge Plan:  Home/Self Care  In-House Referral:     Discharge planning Services  CM Consult  Post Acute Care Choice:  NA Choice offered to:  Patient  DME Arranged:  N/A DME Agency:  NA  HH Arranged:  NA HH Agency:  NA  Status of Service:  Completed, signed off  If discussed at [place] of Stay Meetings, dates discussed:    Additional Comments: CM met with pt in room to confirm plan is for outpt PT; pt confirms as does note from [place].  Pt states he has all DME needed at home.  NO other CM needs were communicated. Dellie Catholic, RN 05/07/2016, 3:33 PM

## 2016-05-07 NOTE — Discharge Summary (Signed)
Physician Discharge Summary   Patient ID: Benjamin Aguilar MRN: [id number] DOB/AGE: 1950-08-19 65 y.o.  Admit date: 05/06/2016 Discharge date: 05/08/2016  Primary Diagnosis:  Osteoarthritis  Left knee(s)  Admission Diagnoses:  Past Medical History:  Diagnosis Date  . Arthritis   . Cancer (HCC)    hx of prostate cancer   . Diabetes mellitus without complication ([place])    type II   . GERD (gastroesophageal reflux disease)   . Heart murmur    as a child   . Hepatitis    hx of hep C - Harvoni therapy for 12 weeks   . Hypertension   . Hypothyroidism   . Neuromuscular disorder ([place])    neuropathy    Discharge Diagnoses:   Principal Problem:   OA (osteoarthritis) of knee  Estimated body mass index is 34.56 kg/m as calculated from the following:   Height as of this encounter: '5\' 9"'  (1.753 m).   Weight as of this encounter: 106.1 kg (234 lb).  Procedure:  Procedure(s) (LRB): LEFT TOTAL KNEE ARTHROPLASTY (Left)   Consults: None  HPI: Benjamin Aguilar is a 65 y.o. year old male with end stage OA of his left knee with progressively worsening pain and dysfunction. He has constant pain, with activity and at rest and significant functional deficits with difficulties even with ADLs. He has had extensive non-op management including analgesics, injections of cortisone and viscosupplements, and home exercise program, but remains in significant pain with significant dysfunction. Radiographs show bone on bone arthritis medial and patellofemoral. He presents now for left Total Knee Arthroplasty.  Laboratory Data: Admission on 05/06/2016  Component Date Value Ref Range Status  . Glucose-Capillary 05/06/2016 121* 65 - 99 mg/dL Final  . Comment 1 05/06/2016 Notify RN   Final  . Glucose-Capillary 05/06/2016 98  65 - 99 mg/dL Final  . WBC 05/07/2016 7.2  4.0 - 10.5 K/uL Final  . RBC 05/07/2016 4.96  4.22 - 5.81 MIL/uL Final  . Hemoglobin 05/07/2016 13.8  13.0 - 17.0 g/dL Final  . HCT  05/07/2016 42.0  39.0 - 52.0 % Final  . MCV 05/07/2016 84.7  78.0 - 100.0 fL Final  . MCH 05/07/2016 27.8  26.0 - 34.0 pg Final  . MCHC 05/07/2016 32.9  30.0 - 36.0 g/dL Final  . RDW 05/07/2016 14.4  11.5 - 15.5 % Final  . Platelets 05/07/2016 105* 150 - 400 K/uL Final  . Sodium 05/07/2016 138  135 - 145 mmol/L Final  . Potassium 05/07/2016 5.3* 3.5 - 5.1 mmol/L Final  . Chloride 05/07/2016 111  101 - 111 mmol/L Final  . CO2 05/07/2016 22  22 - 32 mmol/L Final  . Glucose, Bld 05/07/2016 205* 65 - 99 mg/dL Final  . BUN 05/07/2016 21* 6 - 20 mg/dL Final  . Creatinine, Ser 05/07/2016 0.92  0.61 - 1.24 mg/dL Final  . Calcium 05/07/2016 8.5* 8.9 - 10.3 mg/dL Final  . GFR calc non Af Amer 05/07/2016 >60  >60 mL/min Final  . GFR calc Af Amer 05/07/2016 >60  >60 mL/min Final   Comment: (NOTE) The eGFR has been calculated using the CKD EPI equation. This calculation has not been validated in all clinical situations. eGFR's persistently <60 mL/min signify possible Chronic Kidney Disease.   . Anion gap 05/07/2016 5  5 - 15 Final  . Glucose-Capillary 05/07/2016 132* 65 - 99 mg/dL Final  . Glucose-Capillary 05/07/2016 187* 65 - 99 mg/dL Final  Hospital Outpatient Visit on 04/30/2016  Component Date  Value Ref Range Status  . aPTT 04/30/2016 37* 24 - 36 seconds Final   Comment:        IF BASELINE aPTT IS ELEVATED, SUGGEST PATIENT RISK ASSESSMENT BE USED TO DETERMINE APPROPRIATE ANTICOAGULANT THERAPY.   . WBC 04/30/2016 4.4  4.0 - 10.5 K/uL Final  . RBC 04/30/2016 5.33  4.22 - 5.81 MIL/uL Final  . Hemoglobin 04/30/2016 14.8  13.0 - 17.0 g/dL Final  . HCT 04/30/2016 45.2  39.0 - 52.0 % Final  . MCV 04/30/2016 84.8  78.0 - 100.0 fL Final  . MCH 04/30/2016 27.8  26.0 - 34.0 pg Final  . MCHC 04/30/2016 32.7  30.0 - 36.0 g/dL Final  . RDW 04/30/2016 14.5  11.5 - 15.5 % Final  . Platelets 04/30/2016 112* 150 - 400 K/uL Final  . Sodium 04/30/2016 141  135 - 145 mmol/L Final  . Potassium  04/30/2016 5.1  3.5 - 5.1 mmol/L Final  . Chloride 04/30/2016 112* 101 - 111 mmol/L Final  . CO2 04/30/2016 24  22 - 32 mmol/L Final  . Glucose, Bld 04/30/2016 107* 65 - 99 mg/dL Final  . BUN 04/30/2016 19  6 - 20 mg/dL Final  . Creatinine, Ser 04/30/2016 0.96  0.61 - 1.24 mg/dL Final  . Calcium 04/30/2016 9.0  8.9 - 10.3 mg/dL Final  . Total Protein 04/30/2016 7.0  6.5 - 8.1 g/dL Final  . Albumin 04/30/2016 4.1  3.5 - 5.0 g/dL Final  . AST 04/30/2016 48* 15 - 41 U/L Final  . ALT 04/30/2016 67* 17 - 63 U/L Final  . Alkaline Phosphatase 04/30/2016 61  38 - 126 U/L Final  . Total Bilirubin 04/30/2016 0.8  0.3 - 1.2 mg/dL Final  . GFR calc non Af Amer 04/30/2016 >60  >60 mL/min Final  . GFR calc Af Amer 04/30/2016 >60  >60 mL/min Final   Comment: (NOTE) The eGFR has been calculated using the CKD EPI equation. This calculation has not been validated in all clinical situations. eGFR's persistently <60 mL/min signify possible Chronic Kidney Disease.   . Anion gap 04/30/2016 5  5 - 15 Final  . Prothrombin Time 04/30/2016 13.9  11.4 - 15.2 seconds Final  . INR 04/30/2016 1.07   Final  . ABO/RH(D) 05/06/2016 A NEG   Final  . Antibody Screen 05/06/2016 NEG   Final  . Sample Expiration 05/06/2016 05/09/2016   Final  . Extend sample reason 05/06/2016 NO TRANSFUSIONS OR PREGNANCY IN THE PAST 3 MONTHS   Final  . Color, Urine 04/30/2016 YELLOW  YELLOW Final  . APPearance 04/30/2016 CLEAR  CLEAR Final  . Specific Gravity, Urine 04/30/2016 1.027  1.005 - 1.030 Final  . pH 04/30/2016 5.0  5.0 - 8.0 Final  . Glucose, UA 04/30/2016 >=500* NEGATIVE mg/dL Final  . Hgb urine dipstick 04/30/2016 NEGATIVE  NEGATIVE Final  . Bilirubin Urine 04/30/2016 NEGATIVE  NEGATIVE Final  . Ketones, ur 04/30/2016 NEGATIVE  NEGATIVE mg/dL Final  . Protein, ur 04/30/2016 NEGATIVE  NEGATIVE mg/dL Final  . Nitrite 04/30/2016 NEGATIVE  NEGATIVE Final  . Leukocytes, UA 04/30/2016 NEGATIVE  NEGATIVE Final  . RBC / HPF  04/30/2016 0-5  0 - 5 RBC/hpf Final  . WBC, UA 04/30/2016 0-5  0 - 5 WBC/hpf Final  . Bacteria, UA 04/30/2016 NONE SEEN  NONE SEEN Final  . Squamous Epithelial / LPF 04/30/2016 NONE SEEN  NONE SEEN Final  . Mucous 04/30/2016 PRESENT   Final  . MRSA, PCR 04/30/2016 NEGATIVE  NEGATIVE  Final  . Staphylococcus aureus 04/30/2016 POSITIVE* NEGATIVE Final   Comment:        The Xpert SA Assay (FDA approved for NASAL specimens in patients over 33 years of age), is one component of a comprehensive surveillance program.  Test performance has been validated by [facility] for patients greater than or equal to 57 year old. It is not intended to diagnose infection nor to guide or monitor treatment.   . Hgb A1c MFr Bld 05/01/2016 6.4* 4.8 - 5.6 % Final   Comment: (NOTE)         Pre-diabetes: 5.7 - 6.4         Diabetes: >6.4         Glycemic control for adults with diabetes: <7.0   . Mean Plasma Glucose 05/01/2016 137  mg/dL Final   Comment: (NOTE) Performed At: [facility] [place], [place] [id number] Lindon Romp MD [id number]   . Glucose-Capillary 04/30/2016 112* 65 - 99 mg/dL Final  . ABO/RH(D) 04/30/2016 A NEG   Final     X-Rays:No results found.  EKG:No orders found for this or any previous visit.   Hospital Course: Benjamin Aguilar is a 65 y.o. who was admitted to [facility]. They were brought to the operating room on 05/06/2016 and underwent Procedure(s): LEFT TOTAL KNEE ARTHROPLASTY.  Patient tolerated the procedure well and was later transferred to the recovery room and then to the orthopaedic floor for postoperative care.  They were given PO and IV analgesics for pain control following their surgery.  They were given 24 hours of postoperative antibiotics of  Anti-infectives    Start     Dose/Rate Route Frequency Ordered Stop   05/06/16 2000  ceFAZolin (ANCEF) IVPB 2g/100 mL premix     2 g 200 mL/hr over 30 Minutes Intravenous Every 6  hours 05/06/16 1636 05/07/16 0237   05/06/16 1045  ceFAZolin (ANCEF) IVPB 2g/100 mL premix     2 g 200 mL/hr over 30 Minutes Intravenous On call to O.R. 05/06/16 1045 05/06/16 1344     and started on DVT prophylaxis in the form of Xarelto.   PT and OT were ordered for total joint protocol.  Discharge planning consulted to help with postop disposition and equipment needs.  Patient had a decent night on the evening of surgery.  They started to get up OOB with therapy on day one. Hemovac drain was pulled without difficulty.  Continued to work with therapy into day two.  Dressing was changed on day two and the incision was healing.  Patient was seen in rounds and was ready to go home.  DC home - straight to outpatient Diet - Cardiac diet and Diabetic diet Follow up - in 2 weeks Activity - WBAT Disposition - Home Condition Upon Discharge - Good D/C Meds - See DC Summary DVT Prophylaxis - Xarelto  Discharge Instructions    Call MD / Call 911    Complete by:  As directed    If you experience chest pain or shortness of breath, CALL 911 and be transported to the hospital emergency room.  If you develope a fever above 101 F, pus (white drainage) or increased drainage or redness at the wound, or calf pain, call your surgeon's office.   Change dressing    Complete by:  As directed    Change dressing daily with sterile 4 x 4 inch gauze dressing and apply TED hose. Do not submerge the incision under  water.   Constipation Prevention    Complete by:  As directed    Drink plenty of fluids.  Prune juice may be helpful.  You may use a stool softener, such as Colace (over the counter) 100 mg twice a day.  Use MiraLax (over the counter) for constipation as needed.   Diet - low sodium heart healthy    Complete by:  As directed    Diet Carb Modified    Complete by:  As directed    Discharge instructions    Complete by:  As directed    Pick up stool softner and laxative for home use following surgery while  on pain medications. Do not submerge incision under water. Please use good hand washing techniques while changing dressing each day. May shower starting three days after surgery. Please use a clean towel to pat the incision dry following showers. Continue to use ice for pain and swelling after surgery. Do not use any lotions or creams on the incision until instructed by your surgeon.   Postoperative Constipation Protocol  Constipation - defined medically as fewer than three stools per week and severe constipation as less than one stool per week.  One of the most common issues patients have following surgery is constipation.  Even if you have a regular bowel pattern at home, your normal regimen is likely to be disrupted due to multiple reasons following surgery.  Combination of anesthesia, postoperative narcotics, change in appetite and fluid intake all can affect your bowels.  In order to avoid complications following surgery, here are some recommendations in order to help you during your recovery period.  Colace (docusate) - Pick up an over-the-counter form of Colace or another stool softener and take twice a day as long as you are requiring postoperative pain medications.  Take with a full glass of water daily.  If you experience loose stools or diarrhea, hold the colace until you stool forms back up.  If your symptoms do not get better within 1 week or if they get worse, check with your doctor.  Dulcolax (bisacodyl) - Pick up over-the-counter and take as directed by the product packaging as needed to assist with the movement of your bowels.  Take with a full glass of water.  Use this product as needed if not relieved by Colace only.   MiraLax (polyethylene glycol) - Pick up over-the-counter to have on hand.  MiraLax is a solution that will increase the amount of water in your bowels to assist with bowel movements.  Take as directed and can mix with a glass of water, juice, soda, coffee, or tea.   Take if you go more than two days without a movement. Do not use MiraLax more than once per day. Call your doctor if you are still constipated or irregular after using this medication for 7 days in a row.  If you continue to have problems with postoperative constipation, please contact the office for further assistance and recommendations.  If you experience "the worst abdominal pain ever" or develop nausea or vomiting, please contact the office immediatly for further recommendations for treatment.   Take Xarelto for two and a half more weeks, then discontinue Xarelto. Once the patient has completed the Xarelto, they may resume the 81 mg Aspirin.   Do not put a pillow under the knee. Place it under the heel.    Complete by:  As directed    Do not sit on low chairs, stoools or toilet seats, as  it may be difficult to get up from low surfaces    Complete by:  As directed    Driving restrictions    Complete by:  As directed    No driving until released by the physician.   Increase activity slowly as tolerated    Complete by:  As directed    Lifting restrictions    Complete by:  As directed    No lifting until released by the physician.   Patient may shower    Complete by:  As directed    You may shower without a dressing once there is no drainage.  Do not wash over the wound.  If drainage remains, do not shower until drainage stops.   TED hose    Complete by:  As directed    Use stockings (TED hose) for 3 weeks on both leg(s).  You may remove them at night for sleeping.   Weight bearing as tolerated    Complete by:  As directed    Laterality:  left   Extremity:  Lower     Allergies as of 05/07/2016      Reactions   Alcohol    *No medications with alcohol (patient is a recovery alcoholic)      Medication List    STOP taking these medications   aspirin EC 81 MG tablet   INVOKANA 300 MG Tabs tablet Generic drug:  canagliflozin   meloxicam 15 MG tablet Commonly known as:   MOBIC   multivitamin with minerals tablet   sildenafil 20 MG tablet Commonly known as:  REVATIO   Vitamin D3 1000 units Caps     TAKE these medications   amLODipine 5 MG tablet Commonly known as:  NORVASC Take 5 mg by mouth daily.   ezetimibe 10 MG tablet Commonly known as:  ZETIA Take 10 mg by mouth daily.   glipiZIDE 10 MG tablet Commonly known as:  GLUCOTROL Take 10 mg by mouth 2 (two) times daily.   LEVEMIR FLEXTOUCH 100 UNIT/ML Pen Generic drug:  Insulin Detemir Inject 60 Units into the skin at bedtime.   levothyroxine 112 MCG tablet Commonly known as:  SYNTHROID, LEVOTHROID Take 112 mcg by mouth daily before breakfast.   lisinopril 40 MG tablet Commonly known as:  PRINIVIL,ZESTRIL Take 40 mg by mouth at bedtime.   LYRICA 300 MG capsule Generic drug:  pregabalin Take 300 mg by mouth 2 (two) times daily.   metFORMIN 1000 MG tablet Commonly known as:  GLUCOPHAGE Take 1,000 mg by mouth 2 (two) times daily.   methocarbamol 500 MG tablet Commonly known as:  ROBAXIN Take 1 tablet (500 mg total) by mouth every 6 (six) hours as needed for muscle spasms.   metoprolol succinate 100 MG 24 hr tablet Commonly known as:  TOPROL-XL Take 100 mg by mouth daily.   omeprazole 20 MG capsule Commonly known as:  PRILOSEC Take 20 mg by mouth daily.   oxyCODONE 5 MG immediate release tablet Commonly known as:  Oxy IR/ROXICODONE Take 1-2 tablets (5-10 mg total) by mouth every 3 (three) hours as needed for moderate pain or severe pain.   rivaroxaban 10 MG Tabs tablet Commonly known as:  XARELTO Take 1 tablet (10 mg total) by mouth daily with breakfast. Take Xarelto for two and a half more weeks, then discontinue Xarelto. Once the patient has completed the Xarelto, they may resume the 81 mg Aspirin. Start taking on:  05/08/2016   traMADol 50 MG tablet Commonly known as:  ULTRAM Take 1-2 tablets (50-100 mg total) by mouth every 6 (six) hours as needed for moderate pain.     VICTOZA 18 MG/3ML Sopn Generic drug:  liraglutide Inject 1.8 mg into the skin daily before breakfast.   WELCHOL 625 MG tablet Generic drug:  colesevelam Take 1,875 mg by mouth 2 (two) times daily.      Follow-up Information    Gearlean Alf, MD. Schedule an appointment as soon as possible for a visit on 05/21/2016.   Specialty:  Orthopedic Surgery Contact information: [address] [place] [address] [phone]           Signed: Arlee Muslim, PA-C Orthopaedic Surgery 05/07/2016, 10:19 PM

## 2016-05-07 NOTE — Progress Notes (Signed)
Occupational Therapy Evaluation Patient Details Name: Benjamin Aguilar MRN: [id number] DOB: 1950-12-16 Today's Date: 05/07/2016    History of Present Illness Pt is a 65 year old male s/p L TKA with hx of diabetes and neuropathy.   Clinical Impression   PTA, pt independent with ADL and mobility. Pt making good progress. Began education on compensatory techniques for ADL and functional mobility. Will follow up acutely to complete education to facilitate safe DC home.     Follow Up Recommendations  No OT follow up;Supervision - Intermittent    Equipment Recommendations  3 in 1 bedside commode;Other (comment) (RW)    Recommendations for Other Services       Precautions / Restrictions Precautions Precautions: Fall;Knee      Mobility Bed Mobility Overal bed mobility: Needs Assistance Bed Mobility: Supine to Sit;Sit to Supine     Supine to sit: Supervision Sit to supine: Supervision      Transfers Overall transfer level: Needs assistance Equipment used: Rolling walker (2 wheeled) Transfers: Sit to/from Stand Sit to Stand: Min guard         General transfer comment: vc for hand placement    Balance Overall balance assessment: Needs assistance   Sitting balance-Leahy Scale: Good       Standing balance-Leahy Scale: Fair                              ADL Overall ADL's : Needs assistance/impaired     Grooming: Set up   Upper Body Bathing: Set up   Lower Body Bathing: Minimal assistance;Sit to/from stand   Upper Body Dressing : Set up   Lower Body Dressing: Minimal assistance;Sit to/from stand   Toilet Transfer: Minimal assistance;Ambulation;RW   Toileting- Water quality scientist and Hygiene: Min guard;Sit to/from stand       Functional mobility during ADLs: Minimal assistance;Rolling walker;Cueing for safety;Cueing for sequencing General ADL Comments: discussed options for tub transfer with use of 3in1. May need to assess if pt able to  use 3in1 with 2 legs out to function more like a tub bench. Has difficulty with LB ADL and May benefit form use of AE.     Vision     Perception     Praxis      Pertinent Vitals/Pain Pain Assessment: 0-10 Pain Score: 4  Pain Location: L knee Pain Descriptors / Indicators: Aching;Sore;Tightness Pain Intervention(s): Limited activity within patient's tolerance;Repositioned;Ice applied     Hand Dominance Left   Extremity/Trunk Assessment Upper Extremity Assessment Upper Extremity Assessment: Overall WFL for tasks assessed   Lower Extremity Assessment Lower Extremity Assessment: Defer to PT evaluation  PN in feet - "affects my balance at times"   Cervical / Trunk Assessment Cervical / Trunk Assessment: Normal   Communication Communication Communication: No difficulties   Cognition Arousal/Alertness: Awake/alert Behavior During Therapy: WFL for tasks assessed/performed Overall Cognitive Status: Within Functional Limits for tasks assessed                     General Comments       Exercises       Shoulder Instructions      Home Living Family/patient expects to be discharged to:: Private residence Living Arrangements: Parent;Other relatives;Spouse/significant other (brother) Available Help at Discharge: Available 24 hours/day Type of Home: House Home Access: Stairs to enter Entrance Stairs-Number of Steps: 1   Home Layout: Able to live on main level with bedroom/bathroom;Laundry or work  area in basement     Bathroom Shower/Tub: Tub/shower unit Shower/tub characteristics: Architectural technologist: Standard Bathroom Accessibility: Yes How Accessible: Accessible via walker Home Equipment: Shower seat;Grab bars - tub/shower;Hand held shower head          Prior Functioning/Environment Level of Independence: Independent                 OT Problem List: Decreased strength;Decreased range of motion;Decreased activity tolerance;Impaired balance  (sitting and/or standing);Decreased safety awareness;Decreased knowledge of use of DME or AE;Decreased knowledge of precautions;Obesity;Pain   OT Treatment/Interventions: Self-care/ADL training;DME and/or AE instruction;Therapeutic activities;Patient/family education    OT Goals(Current goals can be found in the care plan section) Acute Rehab OT Goals Patient Stated Goal: to be safe at home OT Goal Formulation: With patient Time For Goal Achievement: 05/14/16 Potential to Achieve Goals: Good  OT Frequency: Min 2X/week   Barriers to D/C:            Co-evaluation              End of Session Equipment Utilized During Treatment: Rolling walker CPM Left Knee CPM Left Knee: Off Nurse Communication: Mobility status  Activity Tolerance: Patient tolerated treatment well Patient left: in bed;with call bell/phone within reach;with family/visitor present;with SCD's reapplied   Time: [id number] OT Time Calculation (min): 29 min Charges:  OT General Charges $OT Visit: 1 Procedure OT Evaluation $OT Eval Moderate Complexity: 1 Procedure OT Treatments $Self Care/Home Management : 8-22 mins G-Codes:    Heriberto Stmartin,HILLARY 06-04-2016, 3:13 PM   [facility], OT/L  [phone] 06-04-2016

## 2016-05-07 NOTE — Discharge Instructions (Addendum)
° °Dr. Frank [name] °Total Joint Specialist °[facility] °[address] °[place],  [address] °[phone] ° °TOTAL KNEE REPLACEMENT POSTOPERATIVE DIRECTIONS ° °Knee Rehabilitation, Guidelines Following Surgery  °Results after knee surgery are often greatly improved when you follow the exercise, range of motion and muscle strengthening exercises prescribed by your doctor. Safety measures are also important to protect the knee from further injury. Any time any of these exercises cause you to have increased pain or swelling in your knee joint, decrease the amount until you are comfortable again and slowly increase them. If you have problems or questions, call your caregiver or physical therapist for advice.  ° °HOME CARE INSTRUCTIONS  °Remove items at home which could result in a fall. This includes throw rugs or furniture in walking pathways.  °· ICE to the affected knee every three hours for 30 minutes at a time and then as needed for pain and swelling.  Continue to use ice on the knee for pain and swelling from surgery. You may notice swelling that will progress down to the foot and ankle.  This is normal after surgery.  Elevate the leg when you are not up walking on it.   °· Continue to use the breathing machine which will help keep your temperature down.  It is common for your temperature to cycle up and down following surgery, especially at night when you are not up moving around and exerting yourself.  The breathing machine keeps your lungs expanded and your temperature down. °· Do not place pillow under knee, focus on keeping the knee straight while resting ° °DIET °You may resume your previous home diet once your are discharged from the hospital. ° °DRESSING / WOUND CARE / SHOWERING °You may shower 3 days after surgery, but keep the wounds dry during showering.  You may use an occlusive plastic wrap (Press'n Seal for example), NO SOAKING/SUBMERGING IN THE BATHTUB.  If the  bandage gets wet, change with a clean dry gauze.  If the incision gets wet, pat the wound dry with a clean towel. °You may start showering once you are discharged home but do not submerge the incision under water. Just pat the incision dry and apply a dry gauze dressing on daily. °Change the surgical dressing daily and reapply a dry dressing each time. ° °ACTIVITY °Walk with your walker as instructed. °Use walker as long as suggested by your caregivers. °Avoid periods of inactivity such as sitting longer than an hour when not asleep. This helps prevent blood clots.  °You may resume a sexual relationship in one month or when given the OK by your doctor.  °You may return to work once you are cleared by your doctor.  °Do not drive a car for 6 weeks or until released by you surgeon.  °Do not drive while taking narcotics. ° °WEIGHT BEARING °Weight bearing as tolerated with assist device (walker, cane, etc) as directed, use it as long as suggested by your surgeon or therapist, typically at least 4-6 weeks. ° °POSTOPERATIVE CONSTIPATION PROTOCOL °Constipation - defined medically as fewer than three stools per week and severe constipation as less than one stool per week. ° °One of the most common issues patients have following surgery is constipation.  Even if you have a regular bowel pattern at home, your normal regimen is likely to be disrupted due to multiple reasons following surgery.  Combination of anesthesia, postoperative narcotics, change in appetite and fluid intake all can affect your bowels.    In order to avoid complications following surgery, here are some recommendations in order to help you during your recovery period. ° °Colace (docusate) - Pick up an over-the-counter form of Colace or another stool softener and take twice a day as long as you are requiring postoperative pain medications.  Take with a full glass of water daily.  If you experience loose stools or diarrhea, hold the colace until you stool forms  back up.  If your symptoms do not get better within 1 week or if they get worse, check with your doctor. ° °Dulcolax (bisacodyl) - Pick up over-the-counter and take as directed by the product packaging as needed to assist with the movement of your bowels.  Take with a full glass of water.  Use this product as needed if not relieved by Colace only.  ° °MiraLax (polyethylene glycol) - Pick up over-the-counter to have on hand.  MiraLax is a solution that will increase the amount of water in your bowels to assist with bowel movements.  Take as directed and can mix with a glass of water, juice, soda, coffee, or tea.  Take if you go more than two days without a movement. °Do not use MiraLax more than once per day. Call your doctor if you are still constipated or irregular after using this medication for 7 days in a row. ° °If you continue to have problems with postoperative constipation, please contact the office for further assistance and recommendations.  If you experience "the worst abdominal pain ever" or develop nausea or vomiting, please contact the office immediatly for further recommendations for treatment. ° °ITCHING ° If you experience itching with your medications, try taking only a single pain pill, or even half a pain pill at a time.  You can also use Benadryl over the counter for itching or also to help with sleep.  ° °TED HOSE STOCKINGS °Wear the elastic stockings on both legs for three weeks following surgery during the day but you may remove then at night for sleeping. ° °MEDICATIONS °See your medication summary on the “After Visit Summary” that the nursing staff will review with you prior to discharge.  You may have some home medications which will be placed on hold until you complete the course of blood thinner medication.  It is important for you to complete the blood thinner medication as prescribed by your surgeon.  Continue your approved medications as instructed at time of  discharge. ° °PRECAUTIONS °If you experience chest pain or shortness of breath - call 911 immediately for transfer to the hospital emergency department.  °If you develop a fever greater that 101 F, purulent drainage from wound, increased redness or drainage from wound, foul odor from the wound/dressing, or calf pain - CONTACT YOUR SURGEON.   °                                                °FOLLOW-UP APPOINTMENTS °Make sure you keep all of your appointments after your operation with your surgeon and caregivers. You should call the office at the above phone number and make an appointment for approximately two weeks after the date of your surgery or on the date instructed by your surgeon outlined in the "After Visit Summary". ° ° °RANGE OF MOTION AND STRENGTHENING EXERCISES  °Rehabilitation of the knee is important following a knee injury or   an operation. After just a few days of immobilization, the muscles of the thigh which control the knee become weakened and shrink (atrophy). Knee exercises are designed to build up the tone and strength of the thigh muscles and to improve knee motion. Often times heat used for twenty to thirty minutes before working out will loosen up your tissues and help with improving the range of motion but do not use heat for the first two weeks following surgery. These exercises can be done on a training (exercise) mat, on the floor, on a table or on a bed. Use what ever works the best and is most comfortable for you Knee exercises include:  °Leg Lifts - While your knee is still immobilized in a splint or cast, you can do straight leg raises. Lift the leg to 60 degrees, hold for 3 sec, and slowly lower the leg. Repeat 10-20 times 2-3 times daily. Perform this exercise against resistance later as your knee gets better.  °Quad and Hamstring Sets - Tighten up the muscle on the front of the thigh (Quad) and hold for 5-10 sec. Repeat this 10-20 times hourly. Hamstring sets are done by pushing the  foot backward against an object and holding for 5-10 sec. Repeat as with quad sets.  °· Leg Slides: Lying on your back, slowly slide your foot toward your buttocks, bending your knee up off the floor (only go as far as is comfortable). Then slowly slide your foot back down until your leg is flat on the floor again. °· Angel Wings: Lying on your back spread your legs to the side as far apart as you can without causing discomfort.  °A rehabilitation program following serious knee injuries can speed recovery and prevent re-injury in the future due to weakened muscles. Contact your doctor or a physical therapist for more information on knee rehabilitation.  ° °IF YOU ARE TRANSFERRED TO A SKILLED REHAB FACILITY °If the patient is transferred to a skilled rehab facility following release from the hospital, a list of the current medications will be sent to the facility for the patient to continue.  When discharged from the skilled rehab facility, please have the facility set up the patient's Home Health Physical Therapy prior to being released. Also, the skilled facility will be responsible for providing the patient with their medications at time of release from the facility to include their pain medication, the muscle relaxants, and their blood thinner medication. If the patient is still at the rehab facility at time of the two week follow up appointment, the skilled rehab facility will also need to assist the patient in arranging follow up appointment in our office and any transportation needs. ° °MAKE SURE YOU:  °Understand these instructions.  °Get help right away if you are not doing well or get worse.  ° ° °Pick up stool softner and laxative for home use following surgery while on pain medications. °Do not submerge incision under water. °Please use good hand washing techniques while changing dressing each day. °May shower starting three days after surgery. °Please use a clean towel to pat the incision dry following  showers. °Continue to use ice for pain and swelling after surgery. °Do not use any lotions or creams on the incision until instructed by your surgeon. ° °Take Xarelto for two and a half more weeks, then discontinue Xarelto. °Once the patient has completed the Xarelto, they may resume the 81 mg Aspirin. ° ° °Information on my medicine - XARELTO® (Rivaroxaban) ° °  This medication education was reviewed with me or my healthcare representative as part of my discharge preparation.   ° °Why was Xarelto® prescribed for you? °Xarelto® was prescribed for you to reduce the risk of blood clots forming after orthopedic surgery. The medical term for these abnormal blood clots is venous thromboembolism (VTE). ° °What do you need to know about xarelto® ? °Take your Xarelto® ONCE DAILY at the same time every day. °You may take it either with or without food. ° °If you have difficulty swallowing the tablet whole, you may crush it and mix in applesauce just prior to taking your dose. ° °Take Xarelto® exactly as prescribed by your doctor and DO NOT stop taking Xarelto® without talking to the doctor who prescribed the medication.  Stopping without other VTE prevention medication to take the place of Xarelto® may increase your risk of developing a clot. ° °After discharge, you should have regular check-up appointments with your healthcare provider that is prescribing your Xarelto®.   ° °What do you do if you miss a dose? °If you miss a dose, take it as soon as you remember on the same day then continue your regularly scheduled once daily regimen the next day. Do not take two doses of Xarelto® on the same day.  ° °Important Safety Information °A possible side effect of Xarelto® is bleeding. You should call your healthcare provider right away if you experience any of the following: °? Bleeding from an injury or your nose that does not stop. °? Unusual colored urine (red or dark brown) or unusual colored stools (red or black). °? Unusual  bruising for unknown reasons. °? A serious fall or if you hit your head (even if there is no bleeding). ° °Some medicines may interact with Xarelto® and might increase your risk of bleeding while on Xarelto®. To help avoid this, consult your healthcare provider or pharmacist prior to using any new prescription or non-prescription medications, including herbals, vitamins, non-steroidal anti-inflammatory drugs (NSAIDs) and supplements. ° °This website has more information on Xarelto®: www.xarelto.com. ° ° °

## 2016-05-07 NOTE — Evaluation (Signed)
Physical Therapy Evaluation Patient Details Name: Benjamin Aguilar MRN: [id number] DOB: [birth year] Today's Date: 05/07/2016   History of Present Illness  Pt is a 65 year old male s/p L TKA with hx of diabetes and neuropathy.  Clinical Impression  Pt is s/p TKA resulting in the deficits listed below (see PT Problem List).  Pt will benefit from skilled PT to increase their independence and safety with mobility to allow discharge to the venue listed below.  Pt mobilizing well POD #1 and performed LE exercises.  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     Follow Up Recommendations Home health PT    Equipment Recommendations  Rolling walker with 5" wheels    Recommendations for Other Services       Precautions / Restrictions Precautions Precautions: Fall;Knee Precaution Comments: able to perform SLR Restrictions Other Position/Activity Restrictions: WBAT      Mobility  Bed Mobility Overal bed mobility: Needs Assistance Bed Mobility: Supine to Sit     Supine to sit: Supervision        Transfers Overall transfer level: Needs assistance Equipment used: Rolling walker (2 wheeled) Transfers: Sit to/from Stand Sit to Stand: Min guard         General transfer comment: verbal cues for UE and LE positioning  Ambulation/Gait Ambulation/Gait assistance: Min guard Ambulation Distance (Feet): 160 Feet Assistive device: Rolling walker (2 wheeled) Gait Pattern/deviations: Step-to pattern;Decreased stance time - left;Antalgic     General Gait Details: verbla cues for sequence, posture, RW positioning  Stairs            Wheelchair Mobility    Modified Rankin (Stroke Patients Only)       Balance                                             Pertinent Vitals/Pain Pain Assessment: 0-10 Pain Score: 3  Pain Location: L knee Pain Descriptors / Indicators:  Aching;Sore;Tightness Pain Intervention(s): Monitored during session;Premedicated before session;Limited activity within patient's tolerance;Repositioned    Home Living Family/patient expects to be discharged to:: Private residence Living Arrangements: Parent   Type of Home: House Home Access: Level entry     Home Layout: Able to live on main level with bedroom/bathroom;Laundry or work area in [employer]: None Additional Comments: 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    Prior Function Level of Independence: Independent               Hand Dominance        Extremity/Trunk Assessment        Lower Extremity Assessment Lower Extremity Assessment: LLE deficits/detail LLE Deficits / Details: able to perform SLR, L knee AAROM 90*       Communication   Communication: No difficulties  Cognition Arousal/Alertness: Awake/alert Behavior During Therapy: WFL for tasks assessed/performed Overall Cognitive Status: Within Functional Limits for tasks assessed                      General Comments      Exercises Total Joint Exercises Ankle Circles/Pumps: AROM;Both;10 reps Quad Sets: AROM;Both;10 reps Short Arc Quad: AROM;Left;10 reps Heel Slides: Left;10 reps;AAROM Hip ABduction/ADduction: AROM;Left;10 reps Straight Leg Raises: AROM;Left;10 reps   Assessment/Plan    PT Assessment Patient needs continued PT services  PT Problem List Decreased strength;Decreased range of motion;Decreased knowledge of use of DME;Decreased mobility;Decreased knowledge of precautions;Pain          PT Treatment Interventions Functional mobility training;Stair training;Gait training;DME instruction;Therapeutic activities;Therapeutic exercise;Patient/family education    PT Goals (Current goals can be found in the Care Plan section)  Acute Rehab PT Goals PT Goal Formulation: With patient Time For Goal Achievement: 05/10/16 Potential to Achieve Goals: Good    Frequency  7X/week   Barriers to discharge        Co-evaluation               End of Session   Activity Tolerance: Patient tolerated treatment well Patient left: in chair;with call bell/phone within reach Nurse Communication: Mobility status         Time: [id number] PT Time Calculation (min) (ACUTE ONLY): 24 min   Charges:   PT Evaluation $PT Eval Low Complexity: 1 Procedure PT Treatments $Therapeutic Exercise: 8-22 mins   PT G Codes:        Xochilt Conant,KATHrine E 05/07/2016, 10:48 AM Carmelia Bake, PT, DPT 05/07/2016 Pager: [phone]

## 2016-05-07 NOTE — Progress Notes (Signed)
   05/07/16 1500  PT Visit Information  Last PT Received On 05/07/16  Assistance Needed +1  History of Present Illness Pt is a 65 year old male s/p L TKA with hx of diabetes and neuropathy.  Subjective Data  Subjective Pt ambulated again in hallway.  Precautions  Precautions Fall;Knee  Restrictions  Other Position/Activity Restrictions WBAT  Pain Assessment  Pain Assessment 0-10  Pain Score 3  Pain Location L knee  Pain Descriptors / Indicators Aching;Sore;Tightness  Pain Intervention(s) Limited activity within patient's tolerance;Monitored during session;Repositioned;Ice applied  Cognition  Arousal/Alertness Awake/alert  Behavior During Therapy WFL for tasks assessed/performed  Overall Cognitive Status Within Functional Limits for tasks assessed  Bed Mobility  Overal bed mobility Modified Independent  Transfers  Overall transfer level Needs assistance  Equipment used Rolling walker (2 wheeled)  Transfers Sit to/from Stand  Sit to Stand Min guard  General transfer comment verbal cues for UE and LE positioning  Ambulation/Gait  Ambulation/Gait assistance Min guard  Ambulation Distance (Feet) 200 Feet  Assistive device Rolling walker (2 wheeled)  Gait Pattern/deviations Step-through pattern;Decreased stance time - left;Antalgic  General Gait Details verbal cues for posture, RW positioning  PT - End of Session  Activity Tolerance Patient tolerated treatment well  Patient left in bed;with call bell/phone within reach  PT - Assessment/Plan  PT Plan Current plan remains appropriate  PT Frequency (ACUTE ONLY) 7X/week  Follow Up Recommendations Home health PT  PT equipment Rolling walker with 5" wheels  PT Time Calculation  PT Start Time (ACUTE ONLY) 1332  PT Stop Time (ACUTE ONLY) 1345  PT Time Calculation (min) (ACUTE ONLY) 13 min  PT General Charges  $$ ACUTE PT VISIT 1 Procedure  PT Treatments  $Gait Training 8-22 mins   Carmelia Bake, PT, DPT 05/07/2016 Pager:  [phone]

## 2016-05-07 NOTE — Progress Notes (Signed)
   Subjective: 1 Day Post-Op Procedure(s) (LRB): LEFT TOTAL KNEE ARTHROPLASTY (Left) Patient reports pain as mild. He was doing great this morning on rounds.  Patient seen in rounds for Dr. Wynelle Link. Patient is well, but has had some minor complaints of pain in the knee, requiring pain medications We will start therapy today.  Plan is to go Home after hospital stay.  Objective: Vital signs in last 24 hours: Temp:  [97.5 F (36.4 C)-98.4 F (36.9 C)] 97.8 F (36.6 C) (12/19 2151) Pulse Rate:  [62-80] 68 (12/19 2151) Resp:  [12-18] 16 (12/19 2151) BP: (111-134)/(55-66) 134/60 (12/19 2151) SpO2:  [92 %-95 %] 94 % (12/19 2151)  Intake/Output from previous day:  Intake/Output Summary (Last 24 hours) at 05/07/16 2211 Last data filed at 05/07/16 2152  Gross per 24 hour  Intake          2818.33 ml  Output             3265 ml  Net          -446.67 ml    Intake/Output this shift: Total I/O In: -  Out: 800 [Urine:800]  Labs:  Recent Labs  05/07/16 0406  HGB 13.8    Recent Labs  05/07/16 0406  WBC 7.2  RBC 4.96  HCT 42.0  PLT 105*    Recent Labs  05/07/16 0406  NA 138  K 5.3*  CL 111  CO2 22  BUN 21*  CREATININE 0.92  GLUCOSE 205*  CALCIUM 8.5*   No results for input(s): LABPT, INR in the last 72 hours.  EXAM General - Patient is Alert, Appropriate and Oriented Extremity - Neurovascular intact Sensation intact distally Intact pulses distally Dorsiflexion/Plantar flexion intact Dressing - dressing C/D/I Motor Function - intact, moving foot and toes well on exam.  Hemovac pulled without difficulty.  Past Medical History:  Diagnosis Date  . Arthritis   . Cancer (HCC)    hx of prostate cancer   . Diabetes mellitus without complication ([place])    type II   . GERD (gastroesophageal reflux disease)   . Heart murmur    as a child   . Hepatitis    hx of hep C - Harvoni therapy for 12 weeks   . Hypertension   . Hypothyroidism   . Neuromuscular disorder  (HCC)    neuropathy     Assessment/Plan: 1 Day Post-Op Procedure(s) (LRB): LEFT TOTAL KNEE ARTHROPLASTY (Left) Principal Problem:   OA (osteoarthritis) of knee  Estimated body mass index is 34.56 kg/m as calculated from the following:   Height as of this encounter: 5\' 9"  (1.753 m).   Weight as of this encounter: 106.1 kg (234 lb). Up with therapy Plan for discharge tomorrow Discharge home - straight to outpatient therapy  DVT Prophylaxis - Xarelto Weight-Bearing as tolerated to left leg D/C O2 and Pulse OX and try on Room Air  Arlee Muslim, PA-C Orthopaedic Surgery 05/07/2016, 10:11 PM

## 2016-05-08 LAB — BASIC METABOLIC PANEL
Anion gap: 6 (ref 5–15)
BUN: 22 mg/dL — AB (ref 6–20)
CALCIUM: 8.7 mg/dL — AB (ref 8.9–10.3)
CO2: 26 mmol/L (ref 22–32)
CREATININE: 1.18 mg/dL (ref 0.61–1.24)
Chloride: 110 mmol/L (ref 101–111)
GFR calc Af Amer: >60 mL/min (ref >60)
Glucose, Bld: 101 mg/dL — ABNORMAL HIGH (ref 65–99)
Potassium: 4.5 mmol/L (ref 3.5–5.1)
SODIUM: 142 mmol/L (ref 135–145)

## 2016-05-08 LAB — CBC
HCT: 38.6 % — ABNORMAL LOW (ref 39.0–52.0)
Hemoglobin: 13.1 g/dL (ref 13.0–17.0)
MCH: 28.2 pg (ref 26.0–34.0)
MCHC: 33.9 g/dL (ref 30.0–36.0)
MCV: 83 fL (ref 78.0–100.0)
PLATELETS: 112 10*3/uL — AB (ref 150–400)
RBC: 4.65 MIL/uL (ref 4.22–5.81)
RDW: 14.5 % (ref 11.5–15.5)
WBC: 9.2 10*3/uL (ref 4.0–10.5)

## 2016-05-08 LAB — GLUCOSE, CAPILLARY
Glucose-Capillary: 93 mg/dL (ref 65–99)
Glucose-Capillary: 100 mg/dL — ABNORMAL HIGH (ref 65–99)

## 2016-05-08 NOTE — Progress Notes (Signed)
Occupational Therapy Treatment Patient Details Name: Benjamin Aguilar MRN: [id number] DOB: 1951/02/28 Today's Date: 05/08/2016    History of present illness Pt is a 65 year old male s/p L TKA with hx of diabetes and neuropathy.   OT comments  Pt ready for DC from OT standpoint  Follow Up Recommendations  No OT follow up;Supervision - Intermittent    Equipment Recommendations  3 in 1 bedside commode;Other (comment) (RW)    Recommendations for Other Services      Precautions / Restrictions Precautions Precautions: Fall;Knee Precaution Comments: able to perform SLR Restrictions Weight Bearing Restrictions: No       Mobility Bed Mobility Overal bed mobility: Modified Independent                Transfers Overall transfer level: Needs assistance Equipment used: Rolling walker (2 wheeled) Transfers: Sit to/from Stand Sit to Stand: Supervision         General transfer comment: verbal cues for UE and LE positioning    Balance                                   ADL Overall ADL's : Needs assistance/impaired                 Upper Body Dressing : Set up;Sitting   Lower Body Dressing: Min guard;Sit to/from stand;Cueing for safety;Cueing for sequencing   Toilet Transfer: Supervision/safety;RW;Ambulation;Cueing for sequencing;Cueing for safety   Toileting- Clothing Manipulation and Hygiene: Supervision/safety;Sit to/from stand;Cueing for safety     Tub/Shower Transfer Details (indicate cue type and reason): pt verbalized safety- pt plans to use 3 n 1 or tub seat.  Pt will have his brother A him the first time with the technique OT instructed          Vision                     Perception     Praxis      Cognition   Behavior During Therapy: [facility] for tasks assessed/performed Overall Cognitive Status: Within Functional Limits for tasks assessed                       Extremity/Trunk Assessment                Exercises     Shoulder Instructions       General Comments      Pertinent Vitals/ Pain       Pain Score: 2  Pain Location: L knee Pain Descriptors / Indicators: Sore Pain Intervention(s): Monitored during session  Home Living                                          Prior Functioning/Environment              Frequency  Min 2X/week        Progress Toward Goals  OT Goals(current goals can now be found in the care plan section)  Progress towards OT goals: Progressing toward goals     Plan      Co-evaluation                 End of Session Equipment Utilized During Treatment: Rolling walker CPM Left Knee CPM Left Knee: Off   Activity Tolerance Patient  tolerated treatment well   Patient Left in bed;with call bell/phone within reach;with family/visitor present;with SCD's reapplied   Nurse Communication Mobility status        Time: 0950-1002 OT Time Calculation (min): 12 min  Charges: OT General Charges $OT Visit: 1 Procedure OT Treatments $Self Care/Home Management : 8-22 mins  Zaim Nitta, Thereasa Parkin 05/08/2016, 11:24 AM

## 2016-05-08 NOTE — Progress Notes (Signed)
Despite yesterday's conversation with pt concerning DME needs and Sharee Pimple Lauer's note, pt NOW states he needs both a rolling walker and 3n1.  Ordres have been placed and [facility] DME rep, Larene Beach has been notified to please deliver the DME so pt can discharge.  No other CM needs were communicated.

## 2016-05-08 NOTE — Progress Notes (Signed)
Pt to d/c home. Outpatient Pt scheduled and No DME is needed. AVS reviewed and "My Chart" discussed with pt. Pt capable of verbalizing medications, dressing changes, signs and symptoms of infection, and follow-up appointments. Remains hemodynamically stable. No signs and symptoms of distress. Educated pt to return to ER in the case of SOB, dizziness, or chest pain.

## 2016-05-08 NOTE — Progress Notes (Signed)
Physical Therapy Treatment Patient Details Name: Benjamin Aguilar MRN: [id number] DOB: September 04, 1950 Today's Date: 05-29-2016    History of Present Illness Pt is a 65 year old male s/p L TKA with hx of diabetes and neuropathy.    PT Comments    Pt ambulated in hallway.  Pt reports increased soreness and stiffness today.  Provided and reviewed HEP handout.  Pt plans to perform exercises once settled at home.  Pt to d/c home today.  Follow Up Recommendations  Home health PT     Equipment Recommendations  Rolling walker with 5" wheels    Recommendations for Other Services       Precautions / Restrictions Precautions Precautions: Fall;Knee Precaution Comments: able to perform SLR Restrictions Other Position/Activity Restrictions: WBAT    Mobility  Bed Mobility Overal bed mobility: Modified Independent Bed Mobility: Supine to Sit     Supine to sit: Supervision        Transfers Overall transfer level: Needs assistance Equipment used: Rolling walker (2 wheeled) Transfers: Sit to/from Stand Sit to Stand: Min guard         General transfer comment: verbal cues for UE and LE positioning  Ambulation/Gait Ambulation/Gait assistance: Min guard Ambulation Distance (Feet): 120 Feet Assistive device: Rolling walker (2 wheeled) Gait Pattern/deviations: Step-through pattern;Decreased stance time - left;Antalgic     General Gait Details: verbal cues for posture, RW positioning   Stairs            Wheelchair Mobility    Modified Rankin (Stroke Patients Only)       Balance                                    Cognition Arousal/Alertness: Awake/alert Behavior During Therapy: WFL for tasks assessed/performed Overall Cognitive Status: Within Functional Limits for tasks assessed                      Exercises      General Comments        Pertinent Vitals/Pain Pain Assessment: 0-10 Pain Score: 3  Pain Location: L knee Pain  Descriptors / Indicators: Sore;Aching Pain Intervention(s): Limited activity within patient's tolerance;Monitored during session;Repositioned    Home Living                      Prior Function            PT Goals (current goals can now be found in the care plan section) Progress towards PT goals: Progressing toward goals    Frequency    7X/week      PT Plan Current plan remains appropriate    Co-evaluation             End of Session   Activity Tolerance: Patient tolerated treatment well Patient left: with call bell/phone within reach;in chair     Time: [id number] PT Time Calculation (min) (ACUTE ONLY): 20 min  Charges:  $Gait Training: 8-22 mins                    G Codes:      Nonnie Pickney,KATHrine E 29-May-2016, 1:01 PM Carmelia Bake, PT, DPT May 29, 2016 Pager: [phone]

## 2016-05-08 NOTE — Progress Notes (Signed)
   Subjective: 2 Days Post-Op Procedure(s) (LRB): LEFT TOTAL KNEE ARTHROPLASTY (Left) Patient reports pain as mild.   Patient seen in rounds with Dr. Wynelle Link. Patient is well, but has had some minor complaints of pain in the knee, requiring pain medications Patient is ready to go home  Objective: Vital signs in last 24 hours: Temp:  [97.8 F (36.6 C)-98.4 F (36.9 C)] 98.4 F (36.9 C) (12/20 0611) Pulse Rate:  [62-87] 87 (12/20 0611) Resp:  [16-18] 16 (12/20 0611) BP: (112-134)/(55-82) 128/82 (12/20 0611) SpO2:  [92 %-95 %] 94 % (12/20 0611)  Intake/Output from previous day:  Intake/Output Summary (Last 24 hours) at 05/08/16 0722 Last data filed at 05/08/16 0612  Gross per 24 hour  Intake             1440 ml  Output             2195 ml  Net             -755 ml    Intake/Output this shift: No intake/output data recorded.  Labs:  Recent Labs  05/07/16 0406 05/08/16 0438  HGB 13.8 13.1    Recent Labs  05/07/16 0406 05/08/16 0438  WBC 7.2 9.2  RBC 4.96 4.65  HCT 42.0 38.6*  PLT 105* 112*    Recent Labs  05/07/16 0406 05/08/16 0438  NA 138 142  K 5.3* 4.5  CL 111 110  CO2 22 26  BUN 21* 22*  CREATININE 0.92 1.18  GLUCOSE 205* 101*  CALCIUM 8.5* 8.7*   No results for input(s): LABPT, INR in the last 72 hours.  EXAM: General - Patient is Alert, Appropriate and Oriented Extremity - Neurovascular intact Sensation intact distally Intact pulses distally Dorsiflexion/Plantar flexion intact Incision - clean, dry, no drainage Motor Function - intact, moving foot and toes well on exam.   Assessment/Plan: 2 Days Post-Op Procedure(s) (LRB): LEFT TOTAL KNEE ARTHROPLASTY (Left) Procedure(s) (LRB): LEFT TOTAL KNEE ARTHROPLASTY (Left) Past Medical History:  Diagnosis Date  . Arthritis   . Cancer (HCC)    hx of prostate cancer   . Diabetes mellitus without complication ([place])    type II   . GERD (gastroesophageal reflux disease)   . Heart murmur    as  a child   . Hepatitis    hx of hep C - Harvoni therapy for 12 weeks   . Hypertension   . Hypothyroidism   . Neuromuscular disorder (HCC)    neuropathy    Principal Problem:   OA (osteoarthritis) of knee  Estimated body mass index is 34.56 kg/m as calculated from the following:   Height as of this encounter: 5\' 9"  (1.753 m).   Weight as of this encounter: 106.1 kg (234 lb). Up with therapy  DC home - straight to outpatient Diet - Cardiac diet and Diabetic diet Follow up - in 2 weeks Activity - WBAT Disposition - Home Condition Upon Discharge - Good D/C Meds - See DC Summary DVT Prophylaxis - Xarelto  Arlee Muslim, PA-C Orthopaedic Surgery 05/08/2016, 7:22 AM

## 2016-05-10 ENCOUNTER — Encounter (HOSPITAL_COMMUNITY): Payer: Self-pay | Admitting: Emergency Medicine

## 2016-05-10 ENCOUNTER — Emergency Department (HOSPITAL_COMMUNITY)
Admission: EM | Admit: 2016-05-10 | Discharge: 2016-05-11 | Disposition: A | Discharge status: Home / Self Care | Payer: Medicare Other | Attending: Emergency Medicine | Admitting: Emergency Medicine

## 2016-05-10 DIAGNOSIS — Z8546 Personal history of malignant neoplasm of prostate: Secondary | ICD-10-CM | POA: Insufficient documentation

## 2016-05-10 DIAGNOSIS — L03116 Cellulitis of left lower limb: Secondary | ICD-10-CM | POA: Diagnosis not present

## 2016-05-10 DIAGNOSIS — I10 Essential (primary) hypertension: Secondary | ICD-10-CM | POA: Diagnosis not present

## 2016-05-10 DIAGNOSIS — Z794 Long term (current) use of insulin: Secondary | ICD-10-CM | POA: Diagnosis not present

## 2016-05-10 DIAGNOSIS — T148XXA Other injury of unspecified body region, initial encounter: Secondary | ICD-10-CM

## 2016-05-10 DIAGNOSIS — E119 Type 2 diabetes mellitus without complications: Secondary | ICD-10-CM | POA: Insufficient documentation

## 2016-05-10 DIAGNOSIS — Z7902 Long term (current) use of antithrombotics/antiplatelets: Secondary | ICD-10-CM | POA: Diagnosis not present

## 2016-05-10 DIAGNOSIS — L989 Disorder of the skin and subcutaneous tissue, unspecified: Secondary | ICD-10-CM | POA: Diagnosis not present

## 2016-05-10 DIAGNOSIS — E039 Hypothyroidism, unspecified: Secondary | ICD-10-CM | POA: Diagnosis not present

## 2016-05-10 DIAGNOSIS — M25462 Effusion, left knee: Secondary | ICD-10-CM | POA: Diagnosis not present

## 2016-05-10 DIAGNOSIS — Z79899 Other long term (current) drug therapy: Secondary | ICD-10-CM | POA: Insufficient documentation

## 2016-05-10 DIAGNOSIS — Z96652 Presence of left artificial knee joint: Secondary | ICD-10-CM | POA: Diagnosis not present

## 2016-05-10 DIAGNOSIS — Z87891 Personal history of nicotine dependence: Secondary | ICD-10-CM | POA: Diagnosis not present

## 2016-05-10 DIAGNOSIS — R2689 Other abnormalities of gait and mobility: Secondary | ICD-10-CM | POA: Diagnosis not present

## 2016-05-10 DIAGNOSIS — M25562 Pain in left knee: Secondary | ICD-10-CM | POA: Diagnosis not present

## 2016-05-10 DIAGNOSIS — L089 Local infection of the skin and subcutaneous tissue, unspecified: Secondary | ICD-10-CM

## 2016-05-10 DIAGNOSIS — Z4801 Encounter for change or removal of surgical wound dressing: Secondary | ICD-10-CM | POA: Diagnosis present

## 2016-05-10 LAB — C-REACTIVE PROTEIN: CRP: 12.2 mg/dL — AB (ref <1.0)

## 2016-05-10 LAB — CBC WITH DIFFERENTIAL/PLATELET
BASOS PCT: 0 %
Basophils Absolute: 0 10*3/uL (ref 0.0–0.1)
Eosinophils Absolute: 0.2 10*3/uL (ref 0.0–0.7)
Eosinophils Relative: 2 %
HEMATOCRIT: 35.8 % — AB (ref 39.0–52.0)
HEMOGLOBIN: 11.9 g/dL — AB (ref 13.0–17.0)
LYMPHS ABS: 1.6 10*3/uL (ref 0.7–4.0)
LYMPHS PCT: 21 %
MCH: 28.1 pg (ref 26.0–34.0)
MCHC: 33.2 g/dL (ref 30.0–36.0)
MCV: 84.4 fL (ref 78.0–100.0)
MONOS PCT: 8 %
Monocytes Absolute: 0.6 10*3/uL (ref 0.1–1.0)
NEUTROS ABS: 5.2 10*3/uL (ref 1.7–7.7)
NEUTROS PCT: 69 %
Platelets: 140 10*3/uL — ABNORMAL LOW (ref 150–400)
RBC: 4.24 MIL/uL (ref 4.22–5.81)
RDW: 14.9 % (ref 11.5–15.5)
WBC: 7.5 10*3/uL (ref 4.0–10.5)

## 2016-05-10 LAB — SEDIMENTATION RATE: Sed Rate: 67 mm/hr — ABNORMAL HIGH (ref 0–16)

## 2016-05-10 LAB — BASIC METABOLIC PANEL
Anion gap: 10 (ref 5–15)
BUN: 25 mg/dL — ABNORMAL HIGH (ref 6–20)
CHLORIDE: 105 mmol/L (ref 101–111)
CO2: 20 mmol/L — AB (ref 22–32)
CREATININE: 1.13 mg/dL (ref 0.61–1.24)
Calcium: 8.6 mg/dL — ABNORMAL LOW (ref 8.9–10.3)
GFR calc non Af Amer: >60 mL/min (ref >60)
GLUCOSE: 51 mg/dL — AB (ref 65–99)
Potassium: 4.3 mmol/L (ref 3.5–5.1)
Sodium: 135 mmol/L (ref 135–145)

## 2016-05-10 LAB — CBG MONITORING, ED: GLUCOSE-CAPILLARY: 44 mg/dL — AB (ref 65–99)

## 2016-05-10 MED ORDER — CEPHALEXIN 500 MG PO CAPS
500.0000 mg | ORAL_CAPSULE | Freq: Once | ORAL | Status: AC
  Administered 2016-05-10: 500 mg via ORAL
  Filled 2016-05-10: qty 1

## 2016-05-10 MED ORDER — OXYCODONE-ACETAMINOPHEN 5-325 MG PO TABS
1.0000 | ORAL_TABLET | ORAL | Status: DC | PRN
  Administered 2016-05-10: 1 via ORAL
  Filled 2016-05-10: qty 1

## 2016-05-10 MED ORDER — MORPHINE SULFATE (PF) 4 MG/ML IV SOLN
4.0000 mg | Freq: Once | INTRAVENOUS | Status: AC
  Administered 2016-05-10: 4 mg via INTRAMUSCULAR
  Filled 2016-05-10: qty 1

## 2016-05-10 NOTE — ED Provider Notes (Signed)
[facility] DEPT Provider Note   CSN: [id number] Arrival date & time: 05/10/16  1704     History   Chief Complaint Chief Complaint  Patient presents with  . Wound Check    HPI Benjamin Aguilar is a 65 y.o. male.  The history is provided by the patient.  Wound Check  This is a new problem. The problem occurs constantly. The problem has been gradually worsening. Nothing aggravates the symptoms. Nothing relieves the symptoms. He has tried nothing for the symptoms. The treatment provided no relief.    Dr. Wynelle Link performed TKR on 05/06/16.  Knee redness is worsening with associated swelling and small amount of discharge. Thinks he popped a stitched during PT and started bleeding. Pt is on Xarelto ppx.   Past Medical History:  Diagnosis Date  . Arthritis   . Cancer (HCC)    hx of prostate cancer   . Diabetes mellitus without complication ([place])    type II   . GERD (gastroesophageal reflux disease)   . Heart murmur    as a child   . Hepatitis    hx of hep C - Harvoni therapy for 12 weeks   . Hypertension   . Hypothyroidism   . Neuromuscular disorder [facility])    neuropathy     Patient Active Problem List   Diagnosis Date Noted  . OA (osteoarthritis) of knee 05/06/2016    Past Surgical History:  Procedure Laterality Date  . HERNIA REPAIR     umbilical hernia repair   . prostate seed implant     . TOTAL KNEE ARTHROPLASTY Left 05/06/2016   Procedure: LEFT TOTAL KNEE ARTHROPLASTY;  Surgeon: Gaynelle Arabian, MD;  Location: [facility] ORS;  Service: Orthopedics;  Laterality: Left;  Adductor Block       Home Medications    Prior to Admission medications   Medication Sig Start Date End Date Taking? Authorizing Provider  amLODipine (NORVASC) 5 MG tablet Take 5 mg by mouth daily.   Yes Historical Provider, MD  canagliflozin (INVOKANA) 300 MG TABS tablet Take 300 mg by mouth daily before breakfast.   Yes Historical Provider, MD  DULoxetine (CYMBALTA) 30 MG capsule Take 30 mg by  mouth daily.   Yes Historical Provider, MD  ezetimibe (ZETIA) 10 MG tablet Take 10 mg by mouth daily.   Yes Historical Provider, MD  glipiZIDE (GLUCOTROL) 10 MG tablet Take 10 mg by mouth 2 (two) times daily before a meal.    Yes Historical Provider, MD  LEVEMIR FLEXTOUCH 100 UNIT/ML Pen Inject 60 Units into the skin at bedtime.   Yes Historical Provider, MD  levothyroxine (SYNTHROID, LEVOTHROID) 112 MCG tablet Take 112 mcg by mouth daily before breakfast.   Yes Historical Provider, MD  lisinopril (PRINIVIL,ZESTRIL) 40 MG tablet Take 40 mg by mouth at bedtime.   Yes Historical Provider, MD  metFORMIN (GLUCOPHAGE) 1000 MG tablet Take 1,000 mg by mouth 2 (two) times daily.   Yes Historical Provider, MD  methocarbamol (ROBAXIN) 500 MG tablet Take 1 tablet (500 mg total) by mouth every 6 (six) hours as needed for muscle spasms. 05/07/16  Yes Alexzandrew L Perkins, PA-C  metoprolol succinate (TOPROL-XL) 100 MG 24 hr tablet Take 100 mg by mouth daily.   Yes Historical Provider, MD  omeprazole (PRILOSEC) 20 MG capsule Take 20 mg by mouth daily.   Yes Historical Provider, MD  oxyCODONE (OXY IR/ROXICODONE) 5 MG immediate release tablet Take 1-2 tablets (5-10 mg total) by mouth every 3 (three) hours as  needed for moderate pain or severe pain. 05/07/16  Yes Alexzandrew L Perkins, PA-C  pregabalin (LYRICA) 300 MG capsule Take 300 mg by mouth 2 (two) times daily.   Yes Historical Provider, MD  rivaroxaban (XARELTO) 10 MG TABS tablet Take 10 mg by mouth daily with breakfast.   Yes Historical Provider, MD  traMADol (ULTRAM) 50 MG tablet Take 1-2 tablets (50-100 mg total) by mouth every 6 (six) hours as needed for moderate pain. 05/07/16  Yes Alexzandrew L Perkins, PA-C  VICTOZA 18 MG/3ML SOPN Inject 1.8 mg into the skin daily before breakfast.   Yes Historical Provider, MD  [facility] 625 MG tablet Take 1,875 mg by mouth 2 (two) times daily.   Yes Historical Provider, MD  cephALEXin (KEFLEX) 500 MG capsule Take 1  capsule (500 mg total) by mouth 4 (four) times daily. 05/11/16 05/21/16  Fatima Blank, MD    Family History History reviewed. No pertinent family history.  Social History Social History  Substance Use Topics  . Smoking status: Former Smoker    Types: Cigarettes    Quit date: 12/19/2015  . Smokeless tobacco: Never Used  . Alcohol use No     Comment: quit drinking 18 years ago      Allergies   Alcohol   Review of Systems Review of Systems Ten systems are reviewed and are negative for acute change except as noted in the HPI   Physical Exam Updated Vital Signs BP 120/58 (BP Location: Left Arm)   Pulse 72   Temp 98.2 F (36.8 C) (Oral)   Resp 17   SpO2 91%   Physical Exam  Constitutional: He is oriented to person, place, and time. He appears well-developed and well-nourished. No distress.  HENT:  Head: Normocephalic and atraumatic.  Nose: Nose normal.  Eyes: Conjunctivae and EOM are normal. Pupils are equal, round, and reactive to light. Right eye exhibits no discharge. Left eye exhibits no discharge. No scleral icterus.  Neck: Normal range of motion. Neck supple.  Cardiovascular: Normal rate and regular rhythm.  Exam reveals no gallop and no friction rub.   No murmur heard. Pulmonary/Chest: Effort normal and breath sounds normal. No stridor. No respiratory distress. He has no rales.  Abdominal: Soft. He exhibits no distension. There is no tenderness.  Musculoskeletal: He exhibits no edema.       Left knee: He exhibits swelling, effusion and erythema (to anterior aspect, streaking down to shin). Tenderness found.  Neurological: He is alert and oriented to person, place, and time.  Skin: Skin is warm and dry. No rash noted. He is not diaphoretic. No erythema.  Psychiatric: He has a normal mood and affect.  Vitals reviewed.    ED Treatments / Results  Labs (all labs ordered are listed, but only abnormal results are displayed) Labs Reviewed  CBC WITH  DIFFERENTIAL/PLATELET - Abnormal; Notable for the following:       Result Value   Hemoglobin 11.9 (*)    HCT 35.8 (*)    Platelets 140 (*)    All other components within normal limits  BASIC METABOLIC PANEL - Abnormal; Notable for the following:    CO2 20 (*)    Glucose, Bld 51 (*)    BUN 25 (*)    Calcium 8.6 (*)    All other components within normal limits  SEDIMENTATION RATE - Abnormal; Notable for the following:    Sed Rate 67 (*)    All other components within normal limits  C-REACTIVE PROTEIN -  Abnormal; Notable for the following:    CRP 12.2 (*)    All other components within normal limits  CBG MONITORING, ED - Abnormal; Notable for the following:    Glucose-Capillary 44 (*)    All other components within normal limits    EKG  EKG Interpretation None       Radiology No results found.  Procedures Procedures (including critical care time)  Medications Ordered in ED Medications  oxyCODONE-acetaminophen (PERCOCET/ROXICET) 5-325 MG per tablet 1 tablet (1 tablet Oral Given 05/10/16 1755)  morphine 4 MG/ML injection 4 mg (4 mg Intramuscular Given 05/10/16 2249)  cephALEXin (KEFLEX) capsule 500 mg (500 mg Oral Given 05/10/16 2356)  HYDROcodone-acetaminophen (NORCO/VICODIN) 5-325 MG per tablet 1 tablet (1 tablet Oral Given 05/11/16 0015)     Initial Impression / Assessment and Plan / ED Course  I have reviewed the triage vital signs and the nursing notes.  Pertinent labs & imaging results that were available during my care of the patient were reviewed by me and considered in my medical decision making (see chart for details).  Clinical Course as of May 11 309  Sat May 11, 2016  0012 Presentation consistent with cellulitis surrounding the surgical wound. Discussed case with orthopedic surgery who evaluated the patient in the emergency department and agreed. We'll place on 10 day course of Keflex. The patient is safe for discharge with strict return precautions.    [PC]    Clinical Course User Index [PC] Fatima Blank, MD      Final Clinical Impressions(s) / ED Diagnoses   Final diagnoses:  Wound infection  Cellulitis of left lower extremity   Disposition: Discharge  Condition: Good  I have discussed the results, Dx and Tx plan with the patient who expressed understanding and agree(s) with the plan. Discharge instructions discussed at great length. The patient was given strict return precautions who verbalized understanding of the instructions. No further questions at time of discharge.    Discharge Medication List as of 05/11/2016 12:12 AM    START taking these medications   Details  cephALEXin (KEFLEX) 500 MG capsule Take 1 capsule (500 mg total) by mouth 4 (four) times daily., Starting Sat 05/11/2016, Until Tue 05/21/2016, Print        Follow Up: Gaynelle Arabian, MD [address] [place] [address] [phone]   as scheduled      Fatima Blank, MD 05/11/16 [phone]

## 2016-05-10 NOTE — ED Triage Notes (Signed)
Pt began to have bleeding from L knee incision site from knee replacement 12/18. Bleeding began after physical therapy today. Pt on blood thinners. Slow ooze from incision site at present.  No other complications from surgery.

## 2016-05-10 NOTE — Consult Note (Signed)
Reason for Consult:  Left knee pain Referring Physician: Dr. Valentina Lucks is an 65 y.o. male.  HPI: 65 y/o male with left knee pain and swelling since PT this afternoon.  He reports that the knee has been red since discharge.  He c/o tightness in the knee particularly superiorly.  He denies f/c/n/v/wt loss.  He has noted a scant amount of drainage at the superior end of the incision when he flexes the knee.  He denies any change in his pain.  He is taking no abx.  Past Medical History:  Diagnosis Date  . Arthritis   . Cancer (HCC)    hx of prostate cancer   . Diabetes mellitus without complication ([place])    type II   . GERD (gastroesophageal reflux disease)   . Heart murmur    as a child   . Hepatitis    hx of hep C - Harvoni therapy for 12 weeks   . Hypertension   . Hypothyroidism   . Neuromuscular disorder ([place])    neuropathy     Past Surgical History:  Procedure Laterality Date  . HERNIA REPAIR     umbilical hernia repair   . prostate seed implant     . TOTAL KNEE ARTHROPLASTY Left 05/06/2016   Procedure: LEFT TOTAL KNEE ARTHROPLASTY;  Surgeon: Gaynelle Arabian, MD;  Location: [facility];  Service: Orthopedics;  Laterality: Left;  Adductor Block    History reviewed. No pertinent family history.  Social History:  reports that he quit smoking about 4 months ago. His smoking use included Cigarettes. He has never used smokeless tobacco. He reports that he does not drink alcohol or use drugs.  Allergies:  Allergies  Allergen Reactions  . Alcohol Other (See Comments)    Pt is a recovering alcoholic -- prefers to not have any medication with alcohol in it.     Medications: I have reviewed the patient's current medications.  Results for orders placed or performed during the hospital encounter of 05/10/16 (from the past 48 hour(s))  C-reactive protein     Status: Abnormal   Collection Time: 05/10/16  4:58 PM  Result Value Ref Range   CRP 12.2 (H) <1.0 mg/dL   Comment: Performed at [facility]  CBC with Differential/Platelet     Status: Abnormal   Collection Time: 05/10/16  7:58 PM  Result Value Ref Range   WBC 7.5 4.0 - 10.5 K/uL   RBC 4.24 4.22 - 5.81 MIL/uL   Hemoglobin 11.9 (L) 13.0 - 17.0 g/dL   HCT 35.8 (L) 39.0 - 52.0 %   MCV 84.4 78.0 - 100.0 fL   MCH 28.1 26.0 - 34.0 pg   MCHC 33.2 30.0 - 36.0 g/dL   RDW 14.9 11.5 - 15.5 %   Platelets 140 (L) 150 - 400 K/uL   Neutrophils Relative % 69 %   Neutro Abs 5.2 1.7 - 7.7 K/uL   Lymphocytes Relative 21 %   Lymphs Abs 1.6 0.7 - 4.0 K/uL   Monocytes Relative 8 %   Monocytes Absolute 0.6 0.1 - 1.0 K/uL   Eosinophils Relative 2 %   Eosinophils Absolute 0.2 0.0 - 0.7 K/uL   Basophils Relative 0 %   Basophils Absolute 0.0 0.0 - 0.1 K/uL  Basic metabolic panel     Status: Abnormal   Collection Time: 05/10/16  7:58 PM  Result Value Ref Range   Sodium 135 135 - 145 mmol/L   Potassium 4.3 3.5 -  5.1 mmol/L   Chloride 105 101 - 111 mmol/L   CO2 20 (L) 22 - 32 mmol/L   Glucose, Bld 51 (L) 65 - 99 mg/dL   BUN 25 (H) 6 - 20 mg/dL   Creatinine, Ser 1.13 0.61 - 1.24 mg/dL   Calcium 8.6 (L) 8.9 - 10.3 mg/dL   GFR calc non Af Amer >60 >60 mL/min   GFR calc Af Amer >60 >60 mL/min    Comment: (NOTE) The eGFR has been calculated using the CKD EPI equation. This calculation has not been validated in all clinical situations. eGFR's persistently <60 mL/min signify possible Chronic Kidney Disease.    Anion gap 10 5 - 15  Sedimentation rate     Status: Abnormal   Collection Time: 05/10/16  7:58 PM  Result Value Ref Range   Sed Rate 67 (H) 0 - 16 mm/hr    No results found.  ROS:  No recent f/c/n/v/wt loss PE:  Blood pressure 125/59, pulse 72, temperature 98.2 F (36.8 C), temperature source Oral, resp. rate 16, SpO2 92 %. wn wd male in nad.  A and O x 4.  Mood and affect normal.  EOMI.  resp unlabored.  L knee with mild swelling. Anterior incision with steri strips in place.  No  drainage.  Slight erythema at the anterior incision.  No significant tenderness or warmth.  ROM 0-60 deg.  5/5 strength in extension.  Pulses palpable in the foot.  Sens to LT intact at the foot.  Assessment/Plan: L knee cellulitis - start Keflex 500 mg po qid x 10 days.  TED stockings and dry dressing.  Rest and elevation for a few days.  F/u in clinic as scheduled.  Call if not improving by Tuesday.  Wylene Simmer 05/10/2016, 11:07 PM

## 2016-05-11 DIAGNOSIS — L03116 Cellulitis of left lower limb: Secondary | ICD-10-CM | POA: Diagnosis not present

## 2016-05-11 MED ORDER — CEPHALEXIN 500 MG PO CAPS: 500.0000 mg | ORAL_CAPSULE | Freq: Four times a day (QID) | ORAL | 0 refills | Status: AC

## 2016-05-11 MED ORDER — HYDROCODONE-ACETAMINOPHEN 5-325 MG PO TABS
1.0000 | ORAL_TABLET | Freq: Once | ORAL | Status: AC
  Administered 2016-05-11: 1 via ORAL
  Filled 2016-05-11: qty 1

## 2016-05-11 NOTE — ED Notes (Signed)
Patient was alert, oriented and stable upon discharge. RN went over AVS and patient had no further questions.  

## 2016-05-14 ENCOUNTER — Other Ambulatory Visit (HOSPITAL_COMMUNITY): Payer: Self-pay | Admitting: Orthopedic Surgery

## 2016-05-14 DIAGNOSIS — Z471 Aftercare following joint replacement surgery: Secondary | ICD-10-CM | POA: Diagnosis not present

## 2016-05-14 DIAGNOSIS — M79605 Pain in left leg: Secondary | ICD-10-CM

## 2016-05-14 DIAGNOSIS — Z96652 Presence of left artificial knee joint: Secondary | ICD-10-CM | POA: Diagnosis not present

## 2016-05-15 ENCOUNTER — Ambulatory Visit (HOSPITAL_COMMUNITY)
Admission: RE | Admit: 2016-05-15 | Discharge: 2016-05-15 | Disposition: A | Discharge status: Home / Self Care | Payer: Medicare Other | Source: Ambulatory Visit | Attending: Cardiovascular Disease | Admitting: Cardiovascular Disease

## 2016-05-15 DIAGNOSIS — Z471 Aftercare following joint replacement surgery: Secondary | ICD-10-CM | POA: Diagnosis not present

## 2016-05-15 DIAGNOSIS — M79605 Pain in left leg: Secondary | ICD-10-CM | POA: Diagnosis not present

## 2016-05-17 DIAGNOSIS — Z96652 Presence of left artificial knee joint: Secondary | ICD-10-CM | POA: Diagnosis not present

## 2016-05-17 DIAGNOSIS — Z471 Aftercare following joint replacement surgery: Secondary | ICD-10-CM | POA: Diagnosis not present

## 2016-05-21 DIAGNOSIS — Z1389 Encounter for screening for other disorder: Secondary | ICD-10-CM | POA: Diagnosis not present

## 2016-05-21 DIAGNOSIS — D62 Acute posthemorrhagic anemia: Secondary | ICD-10-CM | POA: Diagnosis not present

## 2016-05-21 DIAGNOSIS — E1140 Type 2 diabetes mellitus with diabetic neuropathy, unspecified: Secondary | ICD-10-CM | POA: Diagnosis not present

## 2016-05-21 DIAGNOSIS — E785 Hyperlipidemia, unspecified: Secondary | ICD-10-CM | POA: Diagnosis not present

## 2016-05-21 DIAGNOSIS — E039 Hypothyroidism, unspecified: Secondary | ICD-10-CM | POA: Diagnosis not present

## 2016-05-21 DIAGNOSIS — Z471 Aftercare following joint replacement surgery: Secondary | ICD-10-CM | POA: Diagnosis not present

## 2016-05-21 DIAGNOSIS — Z23 Encounter for immunization: Secondary | ICD-10-CM | POA: Diagnosis not present

## 2016-05-21 DIAGNOSIS — Z96652 Presence of left artificial knee joint: Secondary | ICD-10-CM | POA: Diagnosis not present

## 2016-05-21 DIAGNOSIS — I10 Essential (primary) hypertension: Secondary | ICD-10-CM | POA: Diagnosis not present

## 2016-05-22 DIAGNOSIS — E875 Hyperkalemia: Secondary | ICD-10-CM | POA: Diagnosis not present

## 2016-05-24 DIAGNOSIS — E875 Hyperkalemia: Secondary | ICD-10-CM | POA: Diagnosis not present

## 2016-05-28 DIAGNOSIS — Z96652 Presence of left artificial knee joint: Secondary | ICD-10-CM | POA: Diagnosis not present

## 2016-05-28 DIAGNOSIS — R2689 Other abnormalities of gait and mobility: Secondary | ICD-10-CM | POA: Diagnosis not present

## 2016-05-28 DIAGNOSIS — M25562 Pain in left knee: Secondary | ICD-10-CM | POA: Diagnosis not present

## 2016-05-28 DIAGNOSIS — M25462 Effusion, left knee: Secondary | ICD-10-CM | POA: Diagnosis not present

## 2016-06-03 DIAGNOSIS — M25462 Effusion, left knee: Secondary | ICD-10-CM | POA: Diagnosis not present

## 2016-06-03 DIAGNOSIS — Z96652 Presence of left artificial knee joint: Secondary | ICD-10-CM | POA: Diagnosis not present

## 2016-06-03 DIAGNOSIS — R2689 Other abnormalities of gait and mobility: Secondary | ICD-10-CM | POA: Diagnosis not present

## 2016-06-03 DIAGNOSIS — M25562 Pain in left knee: Secondary | ICD-10-CM | POA: Diagnosis not present

## 2016-06-07 DIAGNOSIS — M25462 Effusion, left knee: Secondary | ICD-10-CM | POA: Diagnosis not present

## 2016-06-07 DIAGNOSIS — Z96652 Presence of left artificial knee joint: Secondary | ICD-10-CM | POA: Diagnosis not present

## 2016-06-07 DIAGNOSIS — R2689 Other abnormalities of gait and mobility: Secondary | ICD-10-CM | POA: Diagnosis not present

## 2016-06-07 DIAGNOSIS — M25562 Pain in left knee: Secondary | ICD-10-CM | POA: Diagnosis not present

## 2016-06-10 DIAGNOSIS — Z96652 Presence of left artificial knee joint: Secondary | ICD-10-CM | POA: Diagnosis not present

## 2016-06-10 DIAGNOSIS — M25462 Effusion, left knee: Secondary | ICD-10-CM | POA: Diagnosis not present

## 2016-06-10 DIAGNOSIS — R2689 Other abnormalities of gait and mobility: Secondary | ICD-10-CM | POA: Diagnosis not present

## 2016-06-10 DIAGNOSIS — M25562 Pain in left knee: Secondary | ICD-10-CM | POA: Diagnosis not present

## 2016-06-11 DIAGNOSIS — Z471 Aftercare following joint replacement surgery: Secondary | ICD-10-CM | POA: Diagnosis not present

## 2016-06-11 DIAGNOSIS — Z96652 Presence of left artificial knee joint: Secondary | ICD-10-CM | POA: Diagnosis not present

## 2016-06-13 DIAGNOSIS — E11649 Type 2 diabetes mellitus with hypoglycemia without coma: Secondary | ICD-10-CM | POA: Diagnosis not present

## 2016-06-13 DIAGNOSIS — E162 Hypoglycemia, unspecified: Secondary | ICD-10-CM | POA: Diagnosis not present

## 2016-06-13 DIAGNOSIS — Z794 Long term (current) use of insulin: Secondary | ICD-10-CM | POA: Diagnosis not present

## 2016-06-13 DIAGNOSIS — R4182 Altered mental status, unspecified: Secondary | ICD-10-CM | POA: Diagnosis not present

## 2016-06-13 DIAGNOSIS — E161 Other hypoglycemia: Secondary | ICD-10-CM | POA: Diagnosis not present

## 2016-07-02 DIAGNOSIS — E118 Type 2 diabetes mellitus with unspecified complications: Secondary | ICD-10-CM | POA: Diagnosis not present

## 2016-07-05 DIAGNOSIS — E782 Mixed hyperlipidemia: Secondary | ICD-10-CM | POA: Diagnosis not present

## 2016-07-05 DIAGNOSIS — B182 Chronic viral hepatitis C: Secondary | ICD-10-CM | POA: Insufficient documentation

## 2016-07-05 DIAGNOSIS — I10 Essential (primary) hypertension: Secondary | ICD-10-CM | POA: Diagnosis not present

## 2016-07-05 DIAGNOSIS — Z794 Long term (current) use of insulin: Secondary | ICD-10-CM | POA: Diagnosis not present

## 2016-07-05 DIAGNOSIS — E039 Hypothyroidism, unspecified: Secondary | ICD-10-CM | POA: Diagnosis not present

## 2016-07-05 DIAGNOSIS — R7401 Elevation of levels of liver transaminase levels: Secondary | ICD-10-CM | POA: Insufficient documentation

## 2016-07-05 DIAGNOSIS — R801 Persistent proteinuria, unspecified: Secondary | ICD-10-CM | POA: Diagnosis not present

## 2016-07-05 DIAGNOSIS — R740 Nonspecific elevation of levels of transaminase and lactic acid dehydrogenase [LDH]: Secondary | ICD-10-CM | POA: Diagnosis not present

## 2016-07-05 DIAGNOSIS — E1142 Type 2 diabetes mellitus with diabetic polyneuropathy: Secondary | ICD-10-CM | POA: Diagnosis not present

## 2016-07-05 DIAGNOSIS — R809 Proteinuria, unspecified: Secondary | ICD-10-CM | POA: Diagnosis not present

## 2016-07-05 DIAGNOSIS — E119 Type 2 diabetes mellitus without complications: Secondary | ICD-10-CM | POA: Insufficient documentation

## 2016-07-05 DIAGNOSIS — K7030 Alcoholic cirrhosis of liver without ascites: Secondary | ICD-10-CM | POA: Insufficient documentation

## 2016-07-05 DIAGNOSIS — E1165 Type 2 diabetes mellitus with hyperglycemia: Secondary | ICD-10-CM | POA: Diagnosis not present

## 2016-07-05 DIAGNOSIS — E559 Vitamin D deficiency, unspecified: Secondary | ICD-10-CM | POA: Diagnosis not present

## 2016-07-16 DIAGNOSIS — Z471 Aftercare following joint replacement surgery: Secondary | ICD-10-CM | POA: Diagnosis not present

## 2016-07-16 DIAGNOSIS — Z96652 Presence of left artificial knee joint: Secondary | ICD-10-CM | POA: Diagnosis not present

## 2016-08-20 ENCOUNTER — Other Ambulatory Visit: Payer: Self-pay | Admitting: Nurse Practitioner

## 2016-08-20 DIAGNOSIS — K7469 Other cirrhosis of liver: Secondary | ICD-10-CM

## 2016-08-20 DIAGNOSIS — K7581 Nonalcoholic steatohepatitis (NASH): Secondary | ICD-10-CM | POA: Diagnosis not present

## 2016-08-21 DIAGNOSIS — D62 Acute posthemorrhagic anemia: Secondary | ICD-10-CM | POA: Diagnosis not present

## 2016-08-21 DIAGNOSIS — K766 Portal hypertension: Secondary | ICD-10-CM | POA: Diagnosis not present

## 2016-08-21 DIAGNOSIS — I10 Essential (primary) hypertension: Secondary | ICD-10-CM | POA: Diagnosis not present

## 2016-08-21 DIAGNOSIS — E785 Hyperlipidemia, unspecified: Secondary | ICD-10-CM | POA: Diagnosis not present

## 2016-08-21 DIAGNOSIS — E1140 Type 2 diabetes mellitus with diabetic neuropathy, unspecified: Secondary | ICD-10-CM | POA: Diagnosis not present

## 2016-08-21 DIAGNOSIS — N529 Male erectile dysfunction, unspecified: Secondary | ICD-10-CM | POA: Diagnosis not present

## 2016-08-21 DIAGNOSIS — E039 Hypothyroidism, unspecified: Secondary | ICD-10-CM | POA: Diagnosis not present

## 2016-08-29 ENCOUNTER — Ambulatory Visit
Admission: RE | Admit: 2016-08-29 | Discharge: 2016-08-29 | Disposition: A | Discharge status: Home / Self Care | Payer: Medicare Other | Source: Ambulatory Visit | Attending: Nurse Practitioner | Admitting: Nurse Practitioner

## 2016-08-29 DIAGNOSIS — K7469 Other cirrhosis of liver: Secondary | ICD-10-CM

## 2016-08-29 DIAGNOSIS — D696 Thrombocytopenia, unspecified: Secondary | ICD-10-CM | POA: Diagnosis not present

## 2016-08-29 DIAGNOSIS — K7460 Unspecified cirrhosis of liver: Secondary | ICD-10-CM | POA: Diagnosis not present

## 2016-09-12 DIAGNOSIS — K766 Portal hypertension: Secondary | ICD-10-CM | POA: Diagnosis not present

## 2016-09-12 DIAGNOSIS — K7460 Unspecified cirrhosis of liver: Secondary | ICD-10-CM | POA: Diagnosis not present

## 2016-09-12 DIAGNOSIS — K3189 Other diseases of stomach and duodenum: Secondary | ICD-10-CM | POA: Diagnosis not present

## 2016-09-12 DIAGNOSIS — D6959 Other secondary thrombocytopenia: Secondary | ICD-10-CM | POA: Diagnosis not present

## 2016-09-19 DIAGNOSIS — E1165 Type 2 diabetes mellitus with hyperglycemia: Secondary | ICD-10-CM | POA: Diagnosis not present

## 2016-09-19 DIAGNOSIS — K219 Gastro-esophageal reflux disease without esophagitis: Secondary | ICD-10-CM | POA: Diagnosis not present

## 2016-09-19 DIAGNOSIS — K766 Portal hypertension: Secondary | ICD-10-CM | POA: Diagnosis not present

## 2016-09-19 DIAGNOSIS — K7460 Unspecified cirrhosis of liver: Secondary | ICD-10-CM | POA: Diagnosis not present

## 2016-09-19 DIAGNOSIS — K3189 Other diseases of stomach and duodenum: Secondary | ICD-10-CM | POA: Diagnosis not present

## 2016-09-19 DIAGNOSIS — D6959 Other secondary thrombocytopenia: Secondary | ICD-10-CM | POA: Diagnosis not present

## 2016-09-19 DIAGNOSIS — E785 Hyperlipidemia, unspecified: Secondary | ICD-10-CM | POA: Diagnosis not present

## 2016-09-19 DIAGNOSIS — E1140 Type 2 diabetes mellitus with diabetic neuropathy, unspecified: Secondary | ICD-10-CM | POA: Diagnosis not present

## 2016-09-19 DIAGNOSIS — E039 Hypothyroidism, unspecified: Secondary | ICD-10-CM | POA: Diagnosis not present

## 2016-09-20 DIAGNOSIS — Z6830 Body mass index (BMI) 30.0-30.9, adult: Secondary | ICD-10-CM | POA: Diagnosis not present

## 2016-09-20 DIAGNOSIS — F419 Anxiety disorder, unspecified: Secondary | ICD-10-CM | POA: Diagnosis not present

## 2016-09-20 DIAGNOSIS — G4700 Insomnia, unspecified: Secondary | ICD-10-CM | POA: Diagnosis not present

## 2016-09-20 DIAGNOSIS — Z720 Tobacco use: Secondary | ICD-10-CM | POA: Diagnosis not present

## 2016-10-08 DIAGNOSIS — H029 Unspecified disorder of eyelid: Secondary | ICD-10-CM | POA: Diagnosis not present

## 2016-10-08 DIAGNOSIS — E113293 Type 2 diabetes mellitus with mild nonproliferative diabetic retinopathy without macular edema, bilateral: Secondary | ICD-10-CM | POA: Diagnosis not present

## 2016-11-04 DIAGNOSIS — D481 Neoplasm of uncertain behavior of connective and other soft tissue: Secondary | ICD-10-CM | POA: Diagnosis not present

## 2016-11-04 DIAGNOSIS — L821 Other seborrheic keratosis: Secondary | ICD-10-CM | POA: Diagnosis not present

## 2016-11-11 DIAGNOSIS — E782 Mixed hyperlipidemia: Secondary | ICD-10-CM | POA: Diagnosis not present

## 2016-11-11 DIAGNOSIS — I10 Essential (primary) hypertension: Secondary | ICD-10-CM | POA: Diagnosis not present

## 2016-11-11 DIAGNOSIS — E1165 Type 2 diabetes mellitus with hyperglycemia: Secondary | ICD-10-CM | POA: Diagnosis not present

## 2016-11-11 DIAGNOSIS — Z794 Long term (current) use of insulin: Secondary | ICD-10-CM | POA: Diagnosis not present

## 2016-11-11 DIAGNOSIS — E039 Hypothyroidism, unspecified: Secondary | ICD-10-CM | POA: Diagnosis not present

## 2016-11-18 DIAGNOSIS — R801 Persistent proteinuria, unspecified: Secondary | ICD-10-CM | POA: Diagnosis not present

## 2016-11-18 DIAGNOSIS — E1165 Type 2 diabetes mellitus with hyperglycemia: Secondary | ICD-10-CM | POA: Diagnosis not present

## 2016-11-18 DIAGNOSIS — E782 Mixed hyperlipidemia: Secondary | ICD-10-CM | POA: Diagnosis not present

## 2016-11-18 DIAGNOSIS — Z8719 Personal history of other diseases of the digestive system: Secondary | ICD-10-CM | POA: Diagnosis not present

## 2016-11-18 DIAGNOSIS — E669 Obesity, unspecified: Secondary | ICD-10-CM | POA: Diagnosis not present

## 2016-11-18 DIAGNOSIS — E559 Vitamin D deficiency, unspecified: Secondary | ICD-10-CM | POA: Diagnosis not present

## 2016-11-18 DIAGNOSIS — R740 Nonspecific elevation of levels of transaminase and lactic acid dehydrogenase [LDH]: Secondary | ICD-10-CM | POA: Diagnosis not present

## 2016-11-18 DIAGNOSIS — E039 Hypothyroidism, unspecified: Secondary | ICD-10-CM | POA: Diagnosis not present

## 2016-11-18 DIAGNOSIS — E1142 Type 2 diabetes mellitus with diabetic polyneuropathy: Secondary | ICD-10-CM | POA: Diagnosis not present

## 2016-11-18 DIAGNOSIS — R809 Proteinuria, unspecified: Secondary | ICD-10-CM | POA: Diagnosis not present

## 2016-11-18 DIAGNOSIS — I10 Essential (primary) hypertension: Secondary | ICD-10-CM | POA: Diagnosis not present

## 2016-11-18 DIAGNOSIS — Z794 Long term (current) use of insulin: Secondary | ICD-10-CM | POA: Diagnosis not present

## 2016-11-22 DIAGNOSIS — D62 Acute posthemorrhagic anemia: Secondary | ICD-10-CM | POA: Diagnosis not present

## 2016-11-22 DIAGNOSIS — E785 Hyperlipidemia, unspecified: Secondary | ICD-10-CM | POA: Diagnosis not present

## 2016-11-22 DIAGNOSIS — Z6830 Body mass index (BMI) 30.0-30.9, adult: Secondary | ICD-10-CM | POA: Diagnosis not present

## 2016-11-22 DIAGNOSIS — E1140 Type 2 diabetes mellitus with diabetic neuropathy, unspecified: Secondary | ICD-10-CM | POA: Diagnosis not present

## 2016-11-22 DIAGNOSIS — I10 Essential (primary) hypertension: Secondary | ICD-10-CM | POA: Diagnosis not present

## 2016-11-22 DIAGNOSIS — F419 Anxiety disorder, unspecified: Secondary | ICD-10-CM | POA: Diagnosis not present

## 2016-11-22 DIAGNOSIS — E039 Hypothyroidism, unspecified: Secondary | ICD-10-CM | POA: Diagnosis not present

## 2016-11-25 DIAGNOSIS — G5622 Lesion of ulnar nerve, left upper limb: Secondary | ICD-10-CM | POA: Diagnosis not present

## 2016-11-25 DIAGNOSIS — M720 Palmar fascial fibromatosis [Dupuytren]: Secondary | ICD-10-CM | POA: Diagnosis not present

## 2016-11-25 DIAGNOSIS — G5621 Lesion of ulnar nerve, right upper limb: Secondary | ICD-10-CM | POA: Diagnosis not present

## 2016-11-25 DIAGNOSIS — M542 Cervicalgia: Secondary | ICD-10-CM | POA: Diagnosis not present

## 2016-12-03 DIAGNOSIS — N200 Calculus of kidney: Secondary | ICD-10-CM | POA: Diagnosis not present

## 2016-12-03 DIAGNOSIS — N5201 Erectile dysfunction due to arterial insufficiency: Secondary | ICD-10-CM | POA: Diagnosis not present

## 2016-12-03 DIAGNOSIS — Z8546 Personal history of malignant neoplasm of prostate: Secondary | ICD-10-CM | POA: Diagnosis not present

## 2016-12-03 DIAGNOSIS — N281 Cyst of kidney, acquired: Secondary | ICD-10-CM | POA: Diagnosis not present

## 2016-12-31 DIAGNOSIS — N5201 Erectile dysfunction due to arterial insufficiency: Secondary | ICD-10-CM | POA: Diagnosis not present

## 2017-02-20 DIAGNOSIS — K7460 Unspecified cirrhosis of liver: Secondary | ICD-10-CM | POA: Diagnosis not present

## 2017-02-20 DIAGNOSIS — K7469 Other cirrhosis of liver: Secondary | ICD-10-CM | POA: Diagnosis not present

## 2017-02-28 DIAGNOSIS — F419 Anxiety disorder, unspecified: Secondary | ICD-10-CM | POA: Diagnosis not present

## 2017-02-28 DIAGNOSIS — E039 Hypothyroidism, unspecified: Secondary | ICD-10-CM | POA: Diagnosis not present

## 2017-02-28 DIAGNOSIS — F17200 Nicotine dependence, unspecified, uncomplicated: Secondary | ICD-10-CM | POA: Diagnosis not present

## 2017-02-28 DIAGNOSIS — Z23 Encounter for immunization: Secondary | ICD-10-CM | POA: Diagnosis not present

## 2017-02-28 DIAGNOSIS — I10 Essential (primary) hypertension: Secondary | ICD-10-CM | POA: Diagnosis not present

## 2017-02-28 DIAGNOSIS — Z125 Encounter for screening for malignant neoplasm of prostate: Secondary | ICD-10-CM | POA: Diagnosis not present

## 2017-02-28 DIAGNOSIS — E1140 Type 2 diabetes mellitus with diabetic neuropathy, unspecified: Secondary | ICD-10-CM | POA: Diagnosis not present

## 2017-02-28 DIAGNOSIS — F5104 Psychophysiologic insomnia: Secondary | ICD-10-CM | POA: Diagnosis not present

## 2017-02-28 DIAGNOSIS — C61 Malignant neoplasm of prostate: Secondary | ICD-10-CM | POA: Diagnosis not present

## 2017-02-28 DIAGNOSIS — D62 Acute posthemorrhagic anemia: Secondary | ICD-10-CM | POA: Diagnosis not present

## 2017-02-28 DIAGNOSIS — E785 Hyperlipidemia, unspecified: Secondary | ICD-10-CM | POA: Diagnosis not present

## 2017-03-03 DIAGNOSIS — I10 Essential (primary) hypertension: Secondary | ICD-10-CM | POA: Diagnosis not present

## 2017-03-03 DIAGNOSIS — Z794 Long term (current) use of insulin: Secondary | ICD-10-CM | POA: Diagnosis not present

## 2017-03-03 DIAGNOSIS — E1165 Type 2 diabetes mellitus with hyperglycemia: Secondary | ICD-10-CM | POA: Diagnosis not present

## 2017-03-03 DIAGNOSIS — E039 Hypothyroidism, unspecified: Secondary | ICD-10-CM | POA: Diagnosis not present

## 2017-03-03 DIAGNOSIS — E782 Mixed hyperlipidemia: Secondary | ICD-10-CM | POA: Diagnosis not present

## 2017-03-05 DIAGNOSIS — E1142 Type 2 diabetes mellitus with diabetic polyneuropathy: Secondary | ICD-10-CM | POA: Diagnosis not present

## 2017-03-05 DIAGNOSIS — I10 Essential (primary) hypertension: Secondary | ICD-10-CM | POA: Diagnosis not present

## 2017-03-05 DIAGNOSIS — R801 Persistent proteinuria, unspecified: Secondary | ICD-10-CM | POA: Diagnosis not present

## 2017-03-05 DIAGNOSIS — E039 Hypothyroidism, unspecified: Secondary | ICD-10-CM | POA: Diagnosis not present

## 2017-03-05 DIAGNOSIS — B182 Chronic viral hepatitis C: Secondary | ICD-10-CM | POA: Diagnosis not present

## 2017-03-05 DIAGNOSIS — E559 Vitamin D deficiency, unspecified: Secondary | ICD-10-CM | POA: Diagnosis not present

## 2017-03-05 DIAGNOSIS — K7030 Alcoholic cirrhosis of liver without ascites: Secondary | ICD-10-CM | POA: Diagnosis not present

## 2017-03-05 DIAGNOSIS — E782 Mixed hyperlipidemia: Secondary | ICD-10-CM | POA: Diagnosis not present

## 2017-03-05 DIAGNOSIS — R809 Proteinuria, unspecified: Secondary | ICD-10-CM | POA: Diagnosis not present

## 2017-03-05 DIAGNOSIS — R740 Nonspecific elevation of levels of transaminase and lactic acid dehydrogenase [LDH]: Secondary | ICD-10-CM | POA: Diagnosis not present

## 2017-03-05 DIAGNOSIS — E1165 Type 2 diabetes mellitus with hyperglycemia: Secondary | ICD-10-CM | POA: Diagnosis not present

## 2017-03-05 DIAGNOSIS — Z794 Long term (current) use of insulin: Secondary | ICD-10-CM | POA: Diagnosis not present

## 2017-03-31 DIAGNOSIS — F5104 Psychophysiologic insomnia: Secondary | ICD-10-CM | POA: Diagnosis not present

## 2017-03-31 DIAGNOSIS — E039 Hypothyroidism, unspecified: Secondary | ICD-10-CM | POA: Diagnosis not present

## 2017-03-31 DIAGNOSIS — E785 Hyperlipidemia, unspecified: Secondary | ICD-10-CM | POA: Diagnosis not present

## 2017-03-31 DIAGNOSIS — Z6832 Body mass index (BMI) 32.0-32.9, adult: Secondary | ICD-10-CM | POA: Diagnosis not present

## 2017-04-08 DIAGNOSIS — H25813 Combined forms of age-related cataract, bilateral: Secondary | ICD-10-CM | POA: Diagnosis not present

## 2017-04-08 DIAGNOSIS — E113293 Type 2 diabetes mellitus with mild nonproliferative diabetic retinopathy without macular edema, bilateral: Secondary | ICD-10-CM | POA: Diagnosis not present

## 2017-04-16 ENCOUNTER — Other Ambulatory Visit: Payer: Self-pay | Admitting: Nurse Practitioner

## 2017-04-16 DIAGNOSIS — K7469 Other cirrhosis of liver: Secondary | ICD-10-CM

## 2017-04-22 ENCOUNTER — Ambulatory Visit
Admission: RE | Admit: 2017-04-22 | Discharge: 2017-04-22 | Disposition: A | Discharge status: Home / Self Care | Payer: Medicare Other | Source: Ambulatory Visit | Attending: Nurse Practitioner | Admitting: Nurse Practitioner

## 2017-04-22 DIAGNOSIS — K7469 Other cirrhosis of liver: Secondary | ICD-10-CM

## 2017-04-22 DIAGNOSIS — K7460 Unspecified cirrhosis of liver: Secondary | ICD-10-CM | POA: Diagnosis not present

## 2017-06-02 DIAGNOSIS — Z794 Long term (current) use of insulin: Secondary | ICD-10-CM | POA: Diagnosis not present

## 2017-06-02 DIAGNOSIS — I10 Essential (primary) hypertension: Secondary | ICD-10-CM | POA: Diagnosis not present

## 2017-06-02 DIAGNOSIS — E039 Hypothyroidism, unspecified: Secondary | ICD-10-CM | POA: Diagnosis not present

## 2017-06-02 DIAGNOSIS — E1165 Type 2 diabetes mellitus with hyperglycemia: Secondary | ICD-10-CM | POA: Diagnosis not present

## 2017-06-02 DIAGNOSIS — E1142 Type 2 diabetes mellitus with diabetic polyneuropathy: Secondary | ICD-10-CM | POA: Diagnosis not present

## 2017-06-02 DIAGNOSIS — E782 Mixed hyperlipidemia: Secondary | ICD-10-CM | POA: Diagnosis not present

## 2017-06-03 DIAGNOSIS — E039 Hypothyroidism, unspecified: Secondary | ICD-10-CM | POA: Diagnosis not present

## 2017-06-03 DIAGNOSIS — Z1331 Encounter for screening for depression: Secondary | ICD-10-CM | POA: Diagnosis not present

## 2017-06-03 DIAGNOSIS — D62 Acute posthemorrhagic anemia: Secondary | ICD-10-CM | POA: Diagnosis not present

## 2017-06-03 DIAGNOSIS — I10 Essential (primary) hypertension: Secondary | ICD-10-CM | POA: Diagnosis not present

## 2017-06-03 DIAGNOSIS — F419 Anxiety disorder, unspecified: Secondary | ICD-10-CM | POA: Diagnosis not present

## 2017-06-03 DIAGNOSIS — E1140 Type 2 diabetes mellitus with diabetic neuropathy, unspecified: Secondary | ICD-10-CM | POA: Diagnosis not present

## 2017-06-03 DIAGNOSIS — F17200 Nicotine dependence, unspecified, uncomplicated: Secondary | ICD-10-CM | POA: Diagnosis not present

## 2017-06-03 DIAGNOSIS — Z6831 Body mass index (BMI) 31.0-31.9, adult: Secondary | ICD-10-CM | POA: Diagnosis not present

## 2017-06-03 DIAGNOSIS — E785 Hyperlipidemia, unspecified: Secondary | ICD-10-CM | POA: Diagnosis not present

## 2017-06-03 DIAGNOSIS — Z1389 Encounter for screening for other disorder: Secondary | ICD-10-CM | POA: Diagnosis not present

## 2017-06-04 DIAGNOSIS — E1140 Type 2 diabetes mellitus with diabetic neuropathy, unspecified: Secondary | ICD-10-CM | POA: Diagnosis not present

## 2017-06-05 DIAGNOSIS — E1165 Type 2 diabetes mellitus with hyperglycemia: Secondary | ICD-10-CM | POA: Diagnosis not present

## 2017-06-05 DIAGNOSIS — E039 Hypothyroidism, unspecified: Secondary | ICD-10-CM | POA: Diagnosis not present

## 2017-06-05 DIAGNOSIS — Z794 Long term (current) use of insulin: Secondary | ICD-10-CM | POA: Diagnosis not present

## 2017-06-05 DIAGNOSIS — E669 Obesity, unspecified: Secondary | ICD-10-CM | POA: Diagnosis not present

## 2017-06-05 DIAGNOSIS — R740 Nonspecific elevation of levels of transaminase and lactic acid dehydrogenase [LDH]: Secondary | ICD-10-CM | POA: Diagnosis not present

## 2017-06-05 DIAGNOSIS — E1142 Type 2 diabetes mellitus with diabetic polyneuropathy: Secondary | ICD-10-CM | POA: Diagnosis not present

## 2017-06-05 DIAGNOSIS — E559 Vitamin D deficiency, unspecified: Secondary | ICD-10-CM | POA: Diagnosis not present

## 2017-06-05 DIAGNOSIS — K7030 Alcoholic cirrhosis of liver without ascites: Secondary | ICD-10-CM | POA: Diagnosis not present

## 2017-06-05 DIAGNOSIS — B182 Chronic viral hepatitis C: Secondary | ICD-10-CM | POA: Diagnosis not present

## 2017-06-05 DIAGNOSIS — I10 Essential (primary) hypertension: Secondary | ICD-10-CM | POA: Diagnosis not present

## 2017-06-05 DIAGNOSIS — R801 Persistent proteinuria, unspecified: Secondary | ICD-10-CM | POA: Diagnosis not present

## 2017-06-05 DIAGNOSIS — E782 Mixed hyperlipidemia: Secondary | ICD-10-CM | POA: Diagnosis not present

## 2017-06-12 DIAGNOSIS — E785 Hyperlipidemia, unspecified: Secondary | ICD-10-CM | POA: Diagnosis not present

## 2017-06-12 DIAGNOSIS — Z1331 Encounter for screening for depression: Secondary | ICD-10-CM | POA: Diagnosis not present

## 2017-06-12 DIAGNOSIS — Z9181 History of falling: Secondary | ICD-10-CM | POA: Diagnosis not present

## 2017-06-12 DIAGNOSIS — Z6831 Body mass index (BMI) 31.0-31.9, adult: Secondary | ICD-10-CM | POA: Diagnosis not present

## 2017-06-12 DIAGNOSIS — Z136 Encounter for screening for cardiovascular disorders: Secondary | ICD-10-CM | POA: Diagnosis not present

## 2017-06-12 DIAGNOSIS — Z125 Encounter for screening for malignant neoplasm of prostate: Secondary | ICD-10-CM | POA: Diagnosis not present

## 2017-06-12 DIAGNOSIS — Z0000 Encounter for general adult medical examination without abnormal findings: Secondary | ICD-10-CM | POA: Diagnosis not present

## 2017-06-12 DIAGNOSIS — E669 Obesity, unspecified: Secondary | ICD-10-CM | POA: Diagnosis not present

## 2017-07-21 DIAGNOSIS — Z96652 Presence of left artificial knee joint: Secondary | ICD-10-CM | POA: Insufficient documentation

## 2017-07-24 DIAGNOSIS — M170 Bilateral primary osteoarthritis of knee: Secondary | ICD-10-CM | POA: Diagnosis not present

## 2017-07-24 DIAGNOSIS — Z471 Aftercare following joint replacement surgery: Secondary | ICD-10-CM | POA: Diagnosis not present

## 2017-07-24 DIAGNOSIS — Z96652 Presence of left artificial knee joint: Secondary | ICD-10-CM | POA: Diagnosis not present

## 2017-08-05 IMAGING — US US ABDOMEN LIMITED
1 series · 14 of 25 positions shown · non-contrast
Comparison: 09/27/2015

CLINICAL DATA: Cirrhosis

EXAM:
US ABDOMEN LIMITED - RIGHT UPPER QUADRANT

[Series 1: us abdomen limited · 0.32mm/px · 14 of 40 slices shown]
[im 1/40]
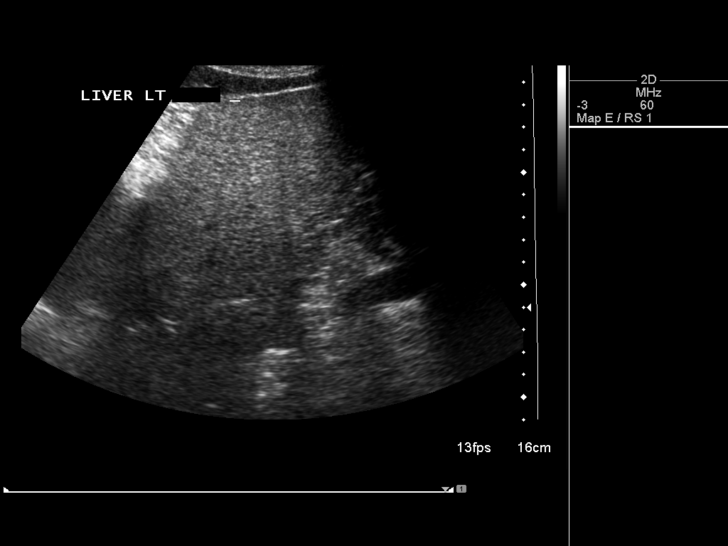
[im 4/40]
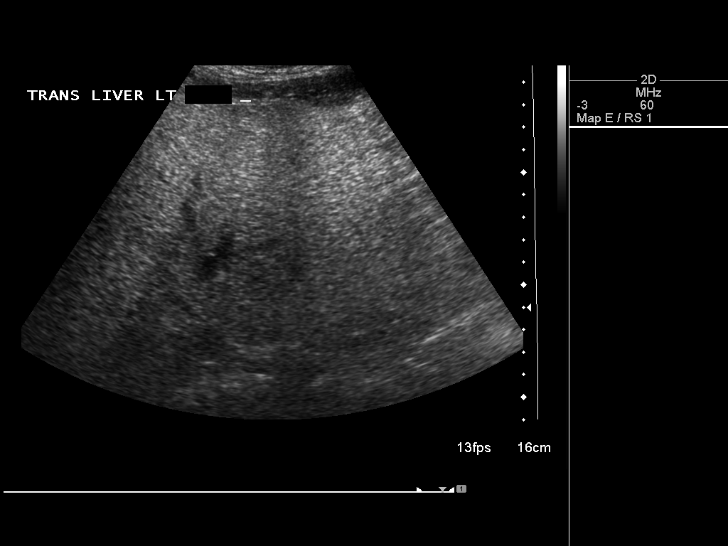
[im 7/40]
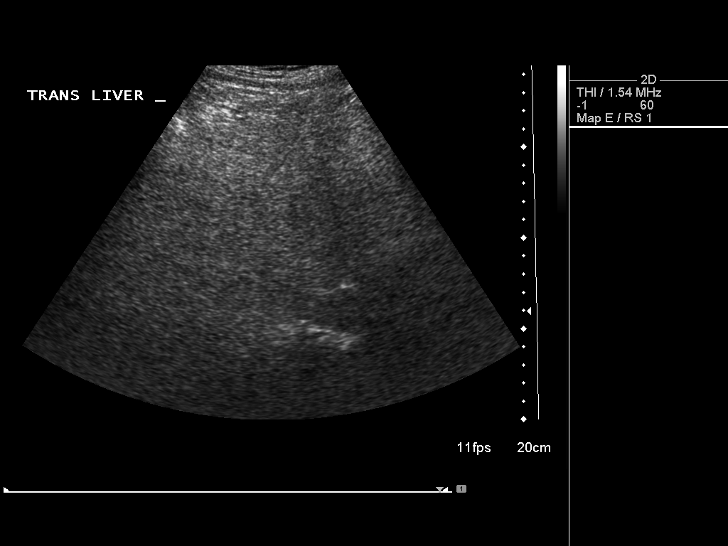
[im 10/40]
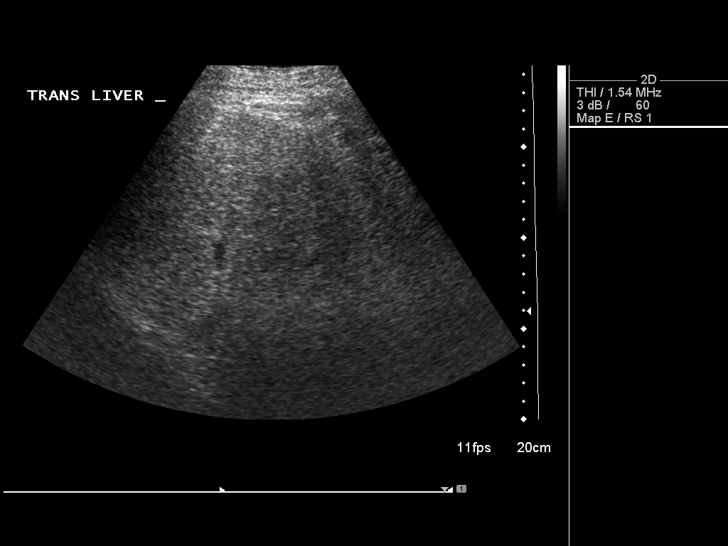
[im 14/40]
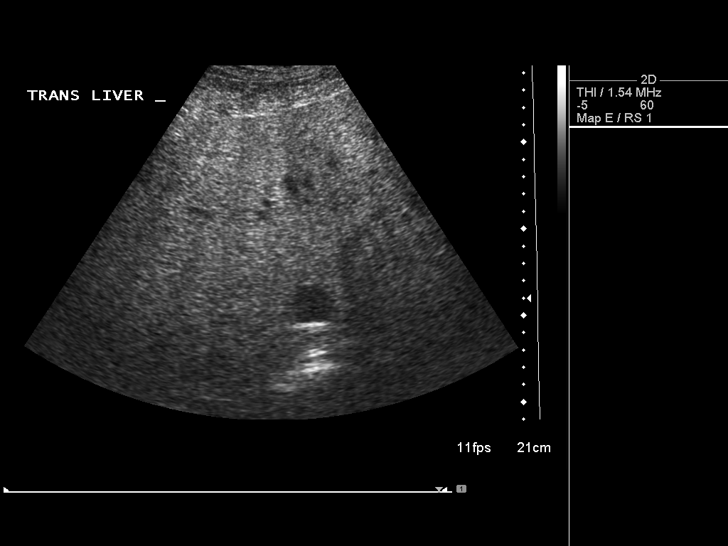
[im 15/40]
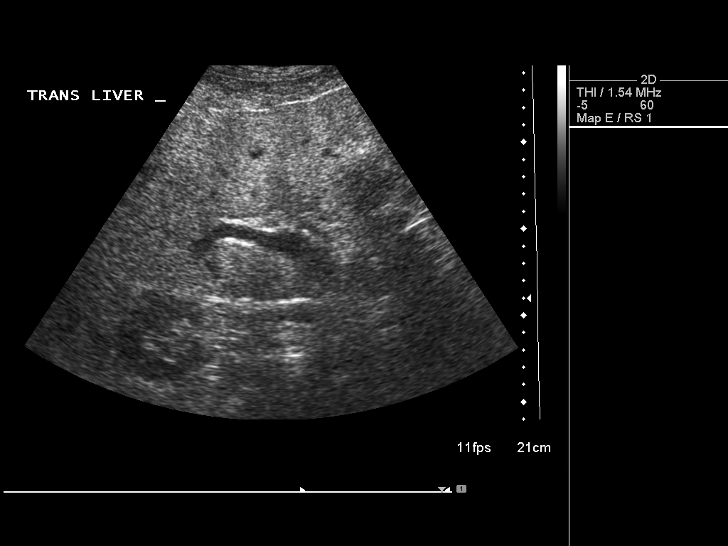
[im 18/40]
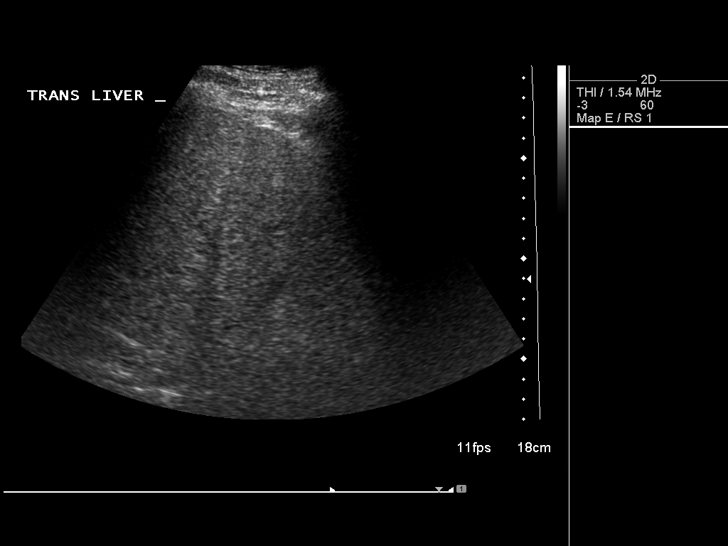
[im 22/40]
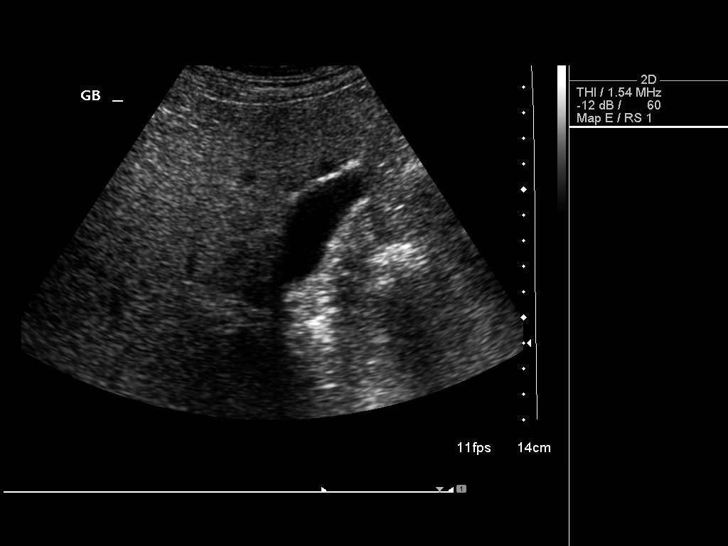
[im 25/40]
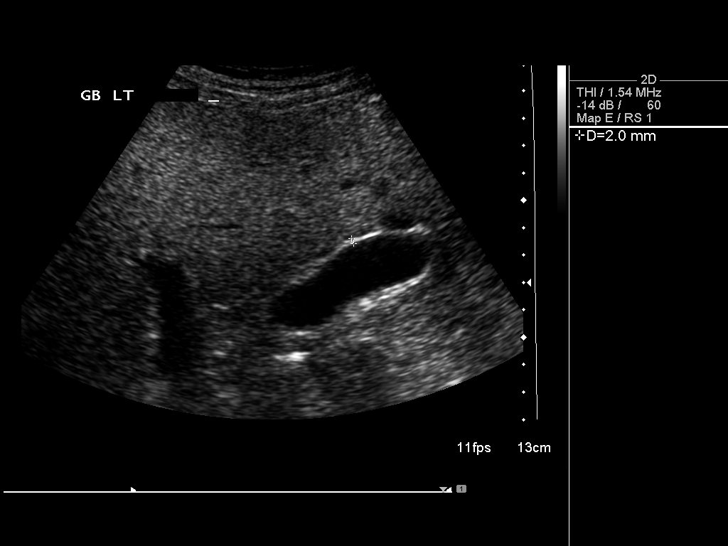
[im 27/40]
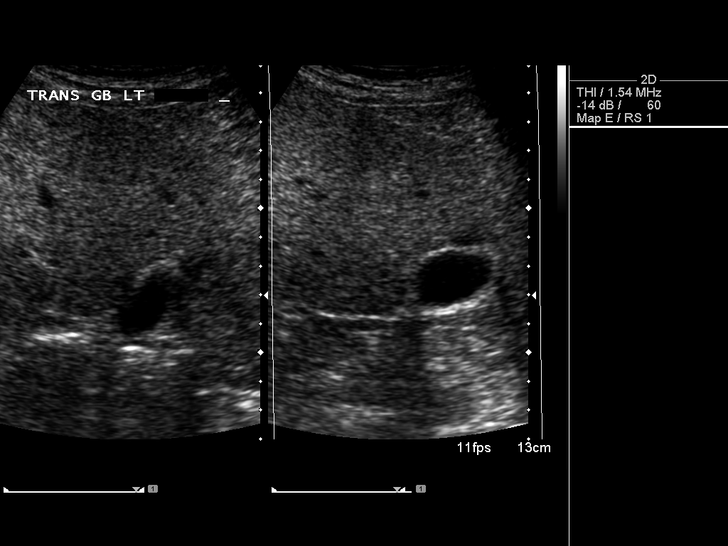
[im 30/40]
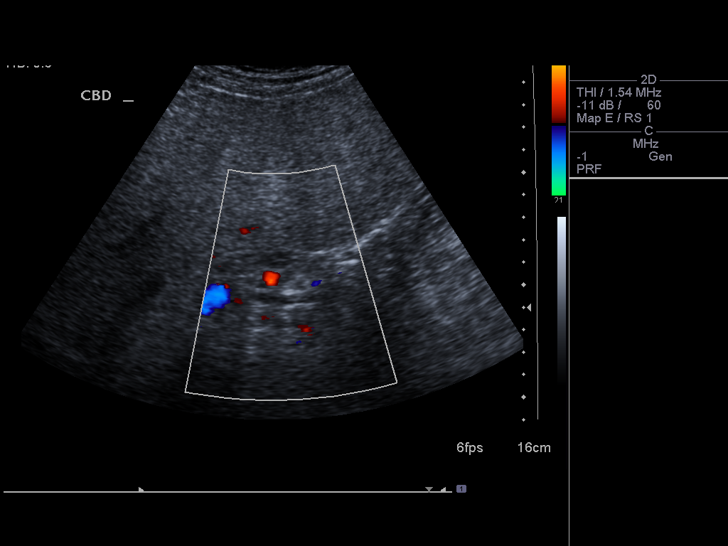
[im 33/40]
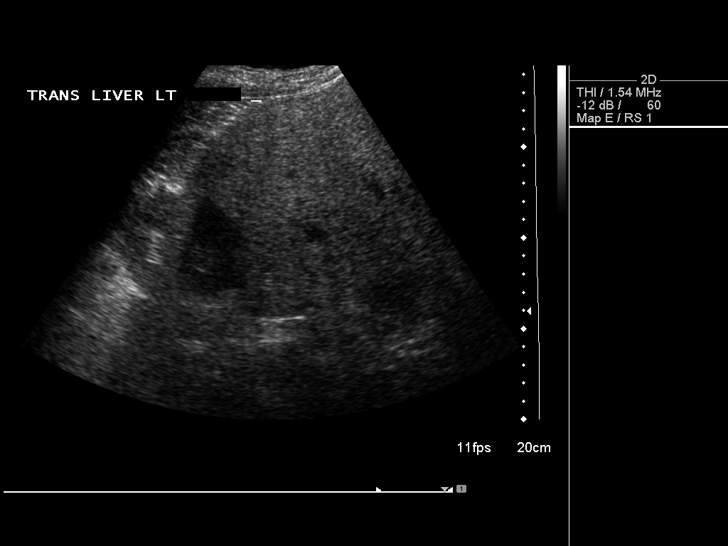
[im 36/40]
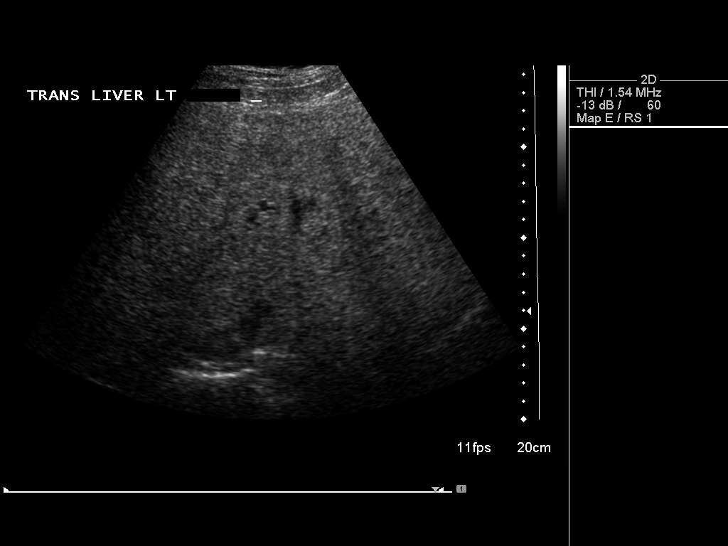
[im 40/40]
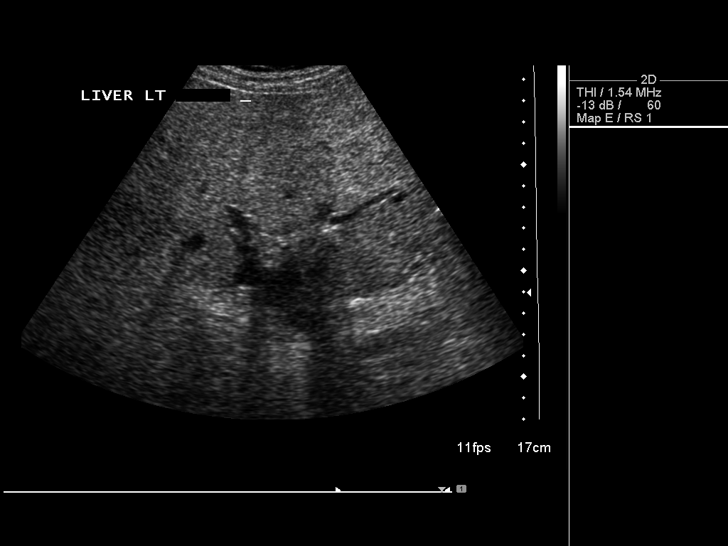

[14 of 25 positions shown; findings below may reference images not displayed]

FINDINGS: Gallbladder:

No gallstones or wall thickening visualized. No sonographic Murphy
sign noted by sonographer.

Common bile duct:

Diameter: Normal caliber, 5 mm

Liver:

Coarsened, increased echotexture with slightly nodular contours. No
focal abnormality or biliary duct dilatation.
IMPRESSION: Findings consistent with cirrhosis.  No focal hepatic abnormality.

## 2017-08-21 ENCOUNTER — Other Ambulatory Visit: Payer: Self-pay | Admitting: Nurse Practitioner

## 2017-08-21 DIAGNOSIS — K7469 Other cirrhosis of liver: Secondary | ICD-10-CM

## 2017-08-21 DIAGNOSIS — L299 Pruritus, unspecified: Secondary | ICD-10-CM | POA: Diagnosis not present

## 2017-08-21 DIAGNOSIS — K7581 Nonalcoholic steatohepatitis (NASH): Secondary | ICD-10-CM | POA: Diagnosis not present

## 2017-09-01 DIAGNOSIS — E785 Hyperlipidemia, unspecified: Secondary | ICD-10-CM | POA: Diagnosis not present

## 2017-09-01 DIAGNOSIS — F419 Anxiety disorder, unspecified: Secondary | ICD-10-CM | POA: Diagnosis not present

## 2017-09-01 DIAGNOSIS — I10 Essential (primary) hypertension: Secondary | ICD-10-CM | POA: Diagnosis not present

## 2017-09-01 DIAGNOSIS — D62 Acute posthemorrhagic anemia: Secondary | ICD-10-CM | POA: Diagnosis not present

## 2017-09-01 DIAGNOSIS — F17200 Nicotine dependence, unspecified, uncomplicated: Secondary | ICD-10-CM | POA: Diagnosis not present

## 2017-09-01 DIAGNOSIS — E1140 Type 2 diabetes mellitus with diabetic neuropathy, unspecified: Secondary | ICD-10-CM | POA: Diagnosis not present

## 2017-09-01 DIAGNOSIS — Z87891 Personal history of nicotine dependence: Secondary | ICD-10-CM | POA: Diagnosis not present

## 2017-09-01 DIAGNOSIS — E039 Hypothyroidism, unspecified: Secondary | ICD-10-CM | POA: Diagnosis not present

## 2017-09-01 DIAGNOSIS — F5104 Psychophysiologic insomnia: Secondary | ICD-10-CM | POA: Diagnosis not present

## 2017-09-05 DIAGNOSIS — R740 Nonspecific elevation of levels of transaminase and lactic acid dehydrogenase [LDH]: Secondary | ICD-10-CM | POA: Diagnosis not present

## 2017-09-05 DIAGNOSIS — E039 Hypothyroidism, unspecified: Secondary | ICD-10-CM | POA: Diagnosis not present

## 2017-09-05 DIAGNOSIS — K7030 Alcoholic cirrhosis of liver without ascites: Secondary | ICD-10-CM | POA: Diagnosis not present

## 2017-09-05 DIAGNOSIS — E782 Mixed hyperlipidemia: Secondary | ICD-10-CM | POA: Diagnosis not present

## 2017-09-05 DIAGNOSIS — E1165 Type 2 diabetes mellitus with hyperglycemia: Secondary | ICD-10-CM | POA: Diagnosis not present

## 2017-09-05 DIAGNOSIS — R801 Persistent proteinuria, unspecified: Secondary | ICD-10-CM | POA: Diagnosis not present

## 2017-09-05 DIAGNOSIS — Z794 Long term (current) use of insulin: Secondary | ICD-10-CM | POA: Diagnosis not present

## 2017-09-05 DIAGNOSIS — B182 Chronic viral hepatitis C: Secondary | ICD-10-CM | POA: Diagnosis not present

## 2017-09-05 DIAGNOSIS — I10 Essential (primary) hypertension: Secondary | ICD-10-CM | POA: Diagnosis not present

## 2017-09-05 DIAGNOSIS — E1142 Type 2 diabetes mellitus with diabetic polyneuropathy: Secondary | ICD-10-CM | POA: Diagnosis not present

## 2017-09-05 DIAGNOSIS — E559 Vitamin D deficiency, unspecified: Secondary | ICD-10-CM | POA: Diagnosis not present

## 2017-09-05 DIAGNOSIS — R809 Proteinuria, unspecified: Secondary | ICD-10-CM | POA: Diagnosis not present

## 2017-09-16 DIAGNOSIS — K7460 Unspecified cirrhosis of liver: Secondary | ICD-10-CM | POA: Diagnosis not present

## 2017-09-16 DIAGNOSIS — Z86010 Personal history of colonic polyps: Secondary | ICD-10-CM | POA: Diagnosis not present

## 2017-09-26 DIAGNOSIS — K648 Other hemorrhoids: Secondary | ICD-10-CM | POA: Diagnosis not present

## 2017-09-26 DIAGNOSIS — Z1211 Encounter for screening for malignant neoplasm of colon: Secondary | ICD-10-CM | POA: Diagnosis not present

## 2017-09-26 DIAGNOSIS — K5730 Diverticulosis of large intestine without perforation or abscess without bleeding: Secondary | ICD-10-CM | POA: Diagnosis not present

## 2017-09-26 DIAGNOSIS — R935 Abnormal findings on diagnostic imaging of other abdominal regions, including retroperitoneum: Secondary | ICD-10-CM | POA: Diagnosis not present

## 2017-09-26 DIAGNOSIS — Z86010 Personal history of colonic polyps: Secondary | ICD-10-CM | POA: Diagnosis not present

## 2017-09-26 DIAGNOSIS — K7460 Unspecified cirrhosis of liver: Secondary | ICD-10-CM | POA: Diagnosis not present

## 2017-09-26 DIAGNOSIS — K5120 Ulcerative (chronic) proctitis without complications: Secondary | ICD-10-CM | POA: Diagnosis not present

## 2017-09-26 DIAGNOSIS — K626 Ulcer of anus and rectum: Secondary | ICD-10-CM | POA: Diagnosis not present

## 2017-10-08 DIAGNOSIS — H524 Presbyopia: Secondary | ICD-10-CM | POA: Diagnosis not present

## 2017-10-08 DIAGNOSIS — H25813 Combined forms of age-related cataract, bilateral: Secondary | ICD-10-CM | POA: Diagnosis not present

## 2017-10-08 DIAGNOSIS — H029 Unspecified disorder of eyelid: Secondary | ICD-10-CM | POA: Diagnosis not present

## 2017-10-08 DIAGNOSIS — H02831 Dermatochalasis of right upper eyelid: Secondary | ICD-10-CM | POA: Diagnosis not present

## 2017-10-08 DIAGNOSIS — E113293 Type 2 diabetes mellitus with mild nonproliferative diabetic retinopathy without macular edema, bilateral: Secondary | ICD-10-CM | POA: Diagnosis not present

## 2017-10-27 DIAGNOSIS — K626 Ulcer of anus and rectum: Secondary | ICD-10-CM | POA: Diagnosis not present

## 2017-10-27 DIAGNOSIS — K633 Ulcer of intestine: Secondary | ICD-10-CM | POA: Diagnosis not present

## 2017-10-27 DIAGNOSIS — K5120 Ulcerative (chronic) proctitis without complications: Secondary | ICD-10-CM | POA: Diagnosis not present

## 2017-10-27 DIAGNOSIS — Z86010 Personal history of colonic polyps: Secondary | ICD-10-CM | POA: Diagnosis not present

## 2017-10-27 DIAGNOSIS — K648 Other hemorrhoids: Secondary | ICD-10-CM | POA: Diagnosis not present

## 2017-11-05 ENCOUNTER — Ambulatory Visit
Admission: RE | Admit: 2017-11-05 | Discharge: 2017-11-05 | Disposition: A | Discharge status: Home / Self Care | Payer: Medicare Other | Source: Ambulatory Visit | Attending: Nurse Practitioner | Admitting: Nurse Practitioner

## 2017-11-05 DIAGNOSIS — K7469 Other cirrhosis of liver: Secondary | ICD-10-CM

## 2017-11-05 DIAGNOSIS — K743 Primary biliary cirrhosis: Secondary | ICD-10-CM | POA: Diagnosis not present

## 2017-11-10 DIAGNOSIS — K629 Disease of anus and rectum, unspecified: Secondary | ICD-10-CM | POA: Diagnosis not present

## 2017-11-10 DIAGNOSIS — C61 Malignant neoplasm of prostate: Secondary | ICD-10-CM | POA: Diagnosis not present

## 2017-11-10 DIAGNOSIS — K5790 Diverticulosis of intestine, part unspecified, without perforation or abscess without bleeding: Secondary | ICD-10-CM | POA: Diagnosis not present

## 2017-11-12 DIAGNOSIS — Z01818 Encounter for other preprocedural examination: Secondary | ICD-10-CM | POA: Diagnosis not present

## 2017-11-12 DIAGNOSIS — H02413 Mechanical ptosis of bilateral eyelids: Secondary | ICD-10-CM | POA: Diagnosis not present

## 2017-11-24 DIAGNOSIS — D481 Neoplasm of uncertain behavior of connective and other soft tissue: Secondary | ICD-10-CM | POA: Diagnosis not present

## 2017-11-24 DIAGNOSIS — H02403 Unspecified ptosis of bilateral eyelids: Secondary | ICD-10-CM | POA: Diagnosis not present

## 2017-11-24 DIAGNOSIS — H02411 Mechanical ptosis of right eyelid: Secondary | ICD-10-CM | POA: Diagnosis not present

## 2017-11-24 DIAGNOSIS — H02413 Mechanical ptosis of bilateral eyelids: Secondary | ICD-10-CM | POA: Diagnosis not present

## 2017-11-24 DIAGNOSIS — D2310 Other benign neoplasm of skin of unspecified eyelid, including canthus: Secondary | ICD-10-CM | POA: Diagnosis not present

## 2017-11-24 DIAGNOSIS — H5789 Other specified disorders of eye and adnexa: Secondary | ICD-10-CM | POA: Diagnosis not present

## 2017-11-24 DIAGNOSIS — H02412 Mechanical ptosis of left eyelid: Secondary | ICD-10-CM | POA: Diagnosis not present

## 2017-11-24 DIAGNOSIS — D492 Neoplasm of unspecified behavior of bone, soft tissue, and skin: Secondary | ICD-10-CM | POA: Diagnosis not present

## 2017-12-02 ENCOUNTER — Encounter: Payer: Self-pay | Admitting: Gastroenterology

## 2017-12-08 DIAGNOSIS — D62 Acute posthemorrhagic anemia: Secondary | ICD-10-CM | POA: Diagnosis not present

## 2017-12-08 DIAGNOSIS — E785 Hyperlipidemia, unspecified: Secondary | ICD-10-CM | POA: Diagnosis not present

## 2017-12-08 DIAGNOSIS — E039 Hypothyroidism, unspecified: Secondary | ICD-10-CM | POA: Diagnosis not present

## 2017-12-08 DIAGNOSIS — E1140 Type 2 diabetes mellitus with diabetic neuropathy, unspecified: Secondary | ICD-10-CM | POA: Diagnosis not present

## 2017-12-10 DIAGNOSIS — K136 Irritative hyperplasia of oral mucosa: Secondary | ICD-10-CM | POA: Diagnosis not present

## 2017-12-23 DIAGNOSIS — B182 Chronic viral hepatitis C: Secondary | ICD-10-CM | POA: Diagnosis not present

## 2017-12-23 DIAGNOSIS — E782 Mixed hyperlipidemia: Secondary | ICD-10-CM | POA: Diagnosis not present

## 2017-12-23 DIAGNOSIS — E1142 Type 2 diabetes mellitus with diabetic polyneuropathy: Secondary | ICD-10-CM | POA: Diagnosis not present

## 2017-12-23 DIAGNOSIS — E669 Obesity, unspecified: Secondary | ICD-10-CM | POA: Diagnosis not present

## 2017-12-23 DIAGNOSIS — E039 Hypothyroidism, unspecified: Secondary | ICD-10-CM | POA: Diagnosis not present

## 2017-12-23 DIAGNOSIS — I10 Essential (primary) hypertension: Secondary | ICD-10-CM | POA: Diagnosis not present

## 2017-12-23 DIAGNOSIS — E1165 Type 2 diabetes mellitus with hyperglycemia: Secondary | ICD-10-CM | POA: Diagnosis not present

## 2017-12-23 DIAGNOSIS — K7030 Alcoholic cirrhosis of liver without ascites: Secondary | ICD-10-CM | POA: Diagnosis not present

## 2017-12-23 DIAGNOSIS — R740 Nonspecific elevation of levels of transaminase and lactic acid dehydrogenase [LDH]: Secondary | ICD-10-CM | POA: Diagnosis not present

## 2017-12-23 DIAGNOSIS — R809 Proteinuria, unspecified: Secondary | ICD-10-CM | POA: Diagnosis not present

## 2017-12-23 DIAGNOSIS — Z794 Long term (current) use of insulin: Secondary | ICD-10-CM | POA: Diagnosis not present

## 2017-12-23 DIAGNOSIS — E559 Vitamin D deficiency, unspecified: Secondary | ICD-10-CM | POA: Diagnosis not present

## 2018-02-05 IMAGING — US US ART/VEN ABD/PELV/SCROTUM DOPPLER LTD
1 series · 14 of 25 positions shown · non-contrast
Comparison: None.

CLINICAL DATA: Hepatic cirrhosis

EXAM:
DUPLEX ULTRASOUND OF LIVER
TECHNIQUE: Color and duplex Doppler ultrasound was performed to evaluate the
hepatic in-flow and out-flow vessels.

[Series 1: us art/ven abd/pelv/scrotum doppler ltd · 0.28mm/px · 14 of 107 slices shown]
[im 1/107]
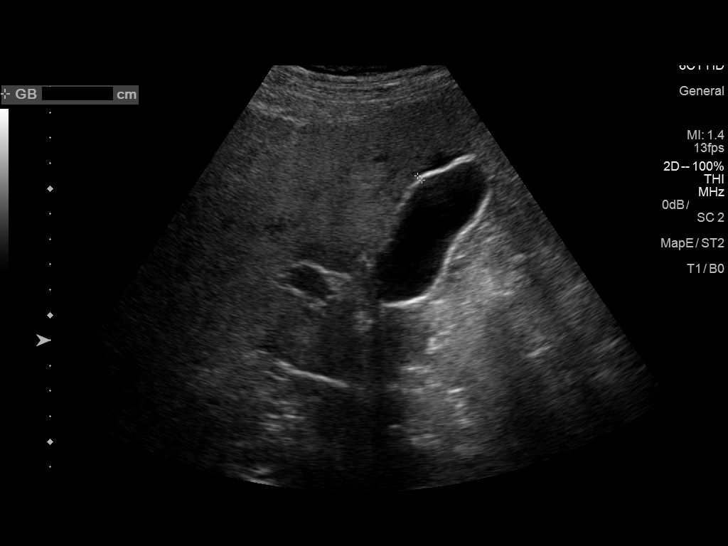
[im 9/107]
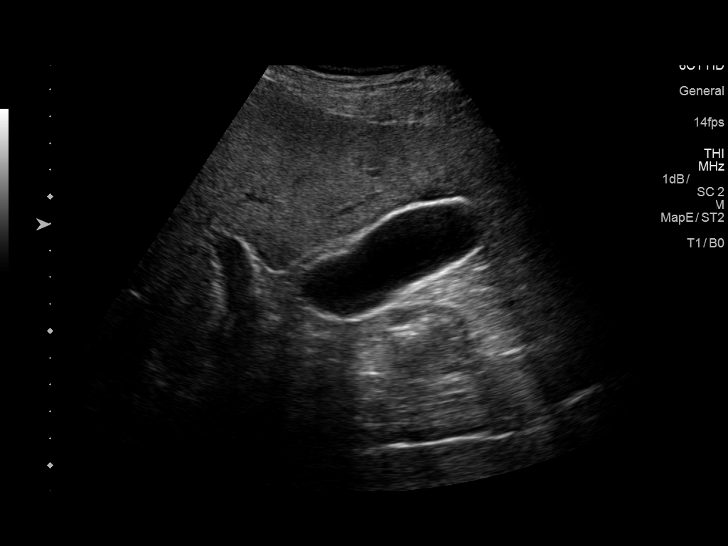
[im 18/107]
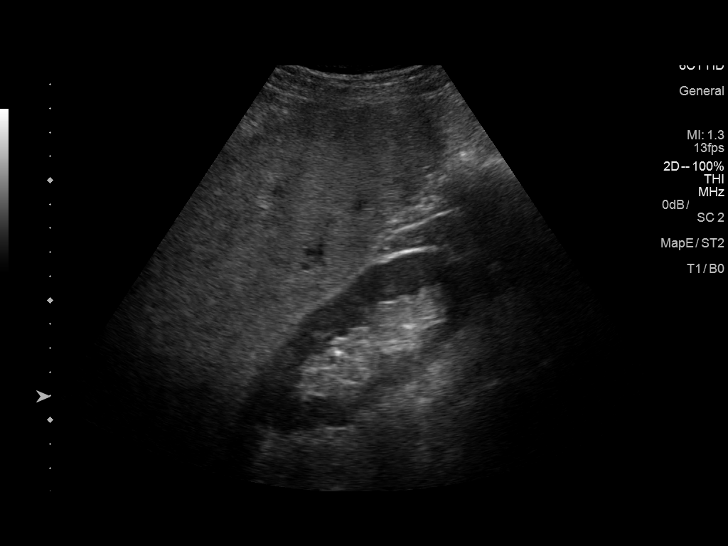
[im 27/107]
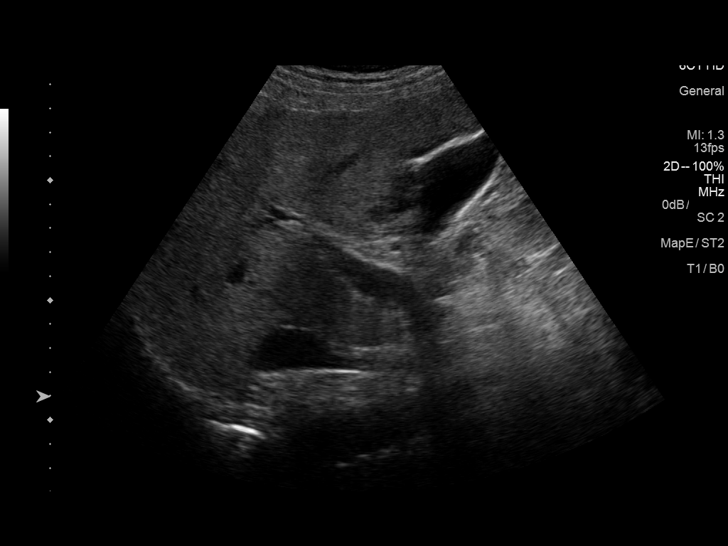
[im 36/107]
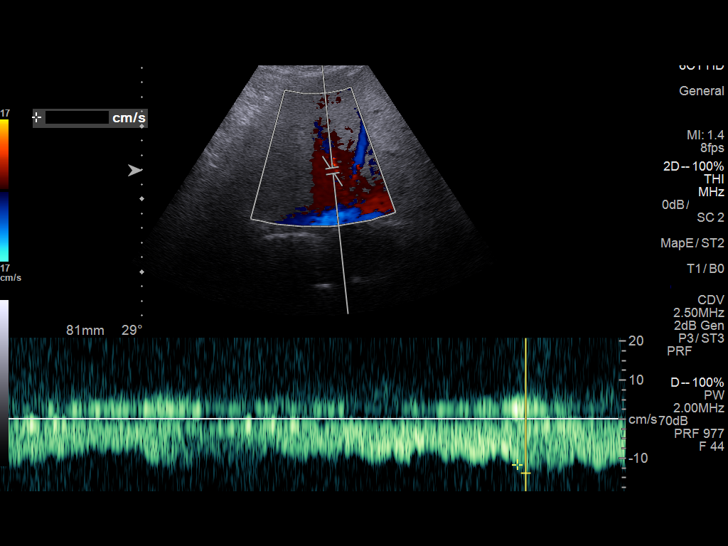
[im 40/107]
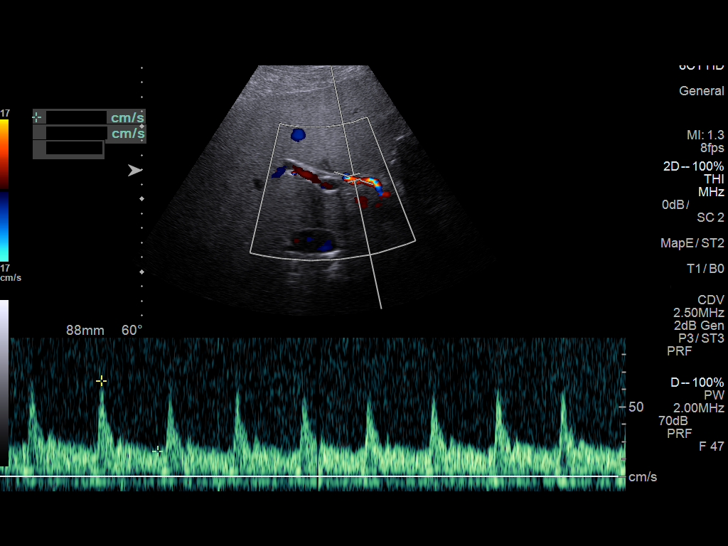
[im 49/107]
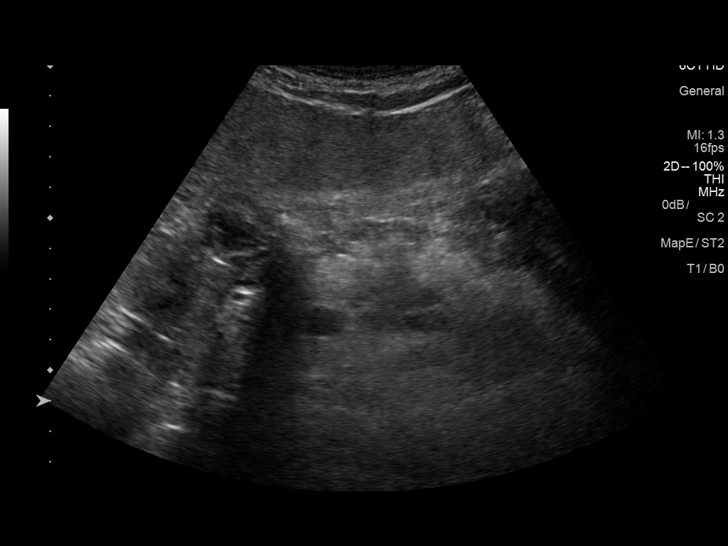
[im 58/107]
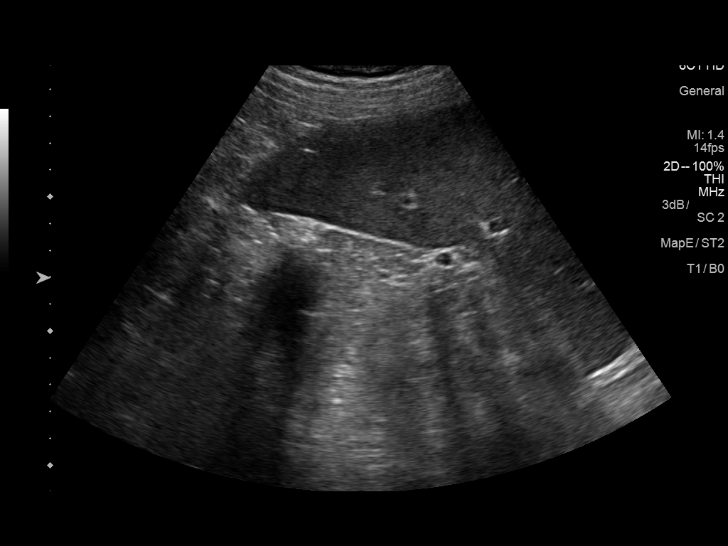
[im 67/107]
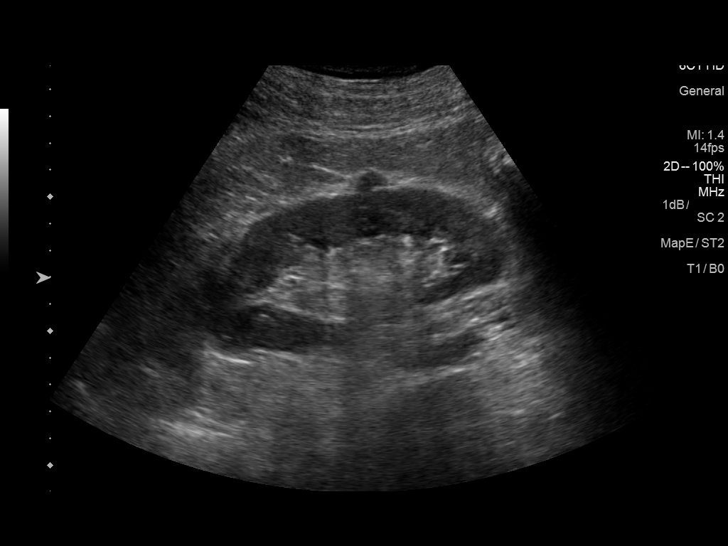
[im 71/107]
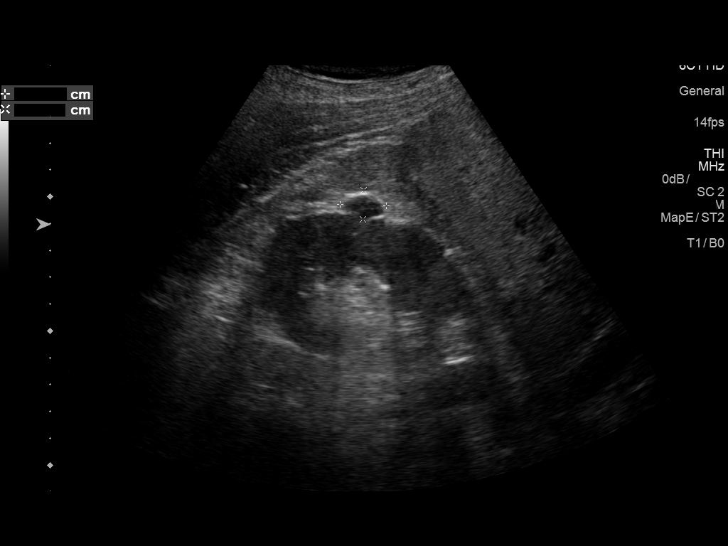
[im 80/107]
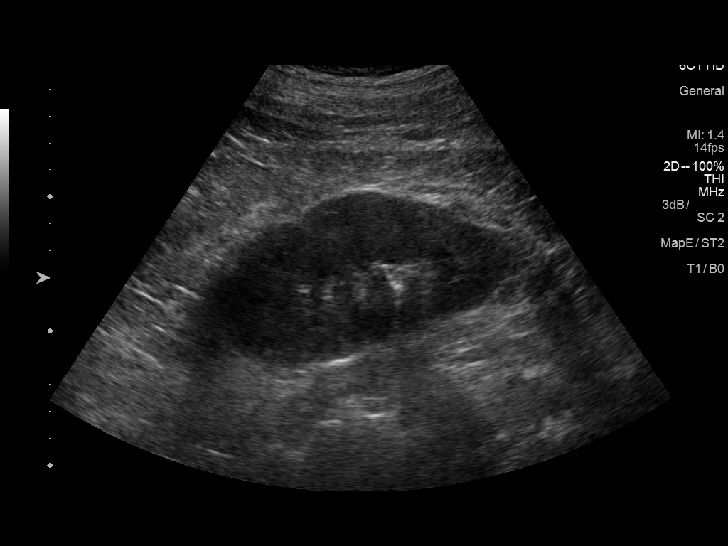
[im 89/107]
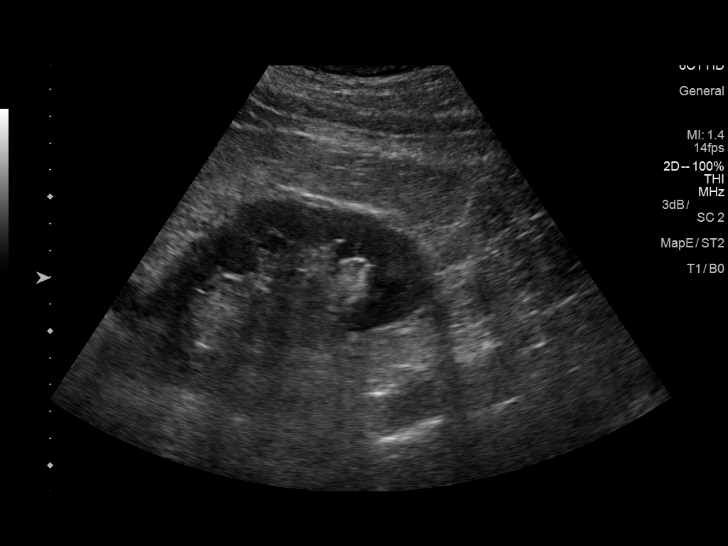
[im 98/107]
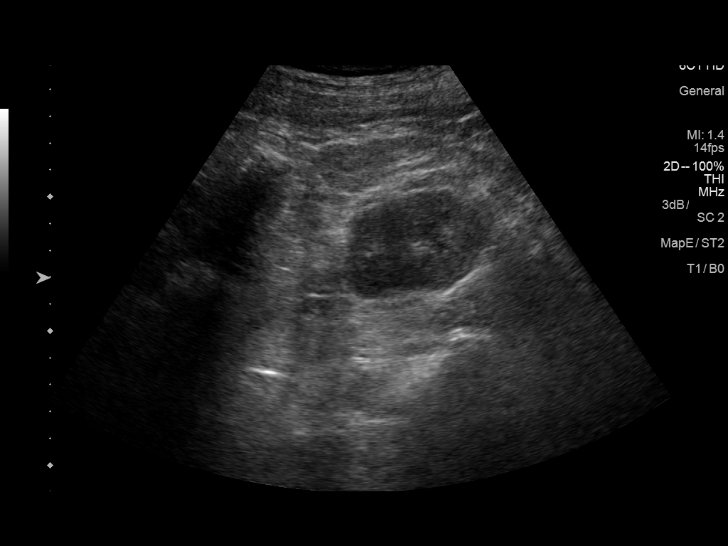
[im 107/107]
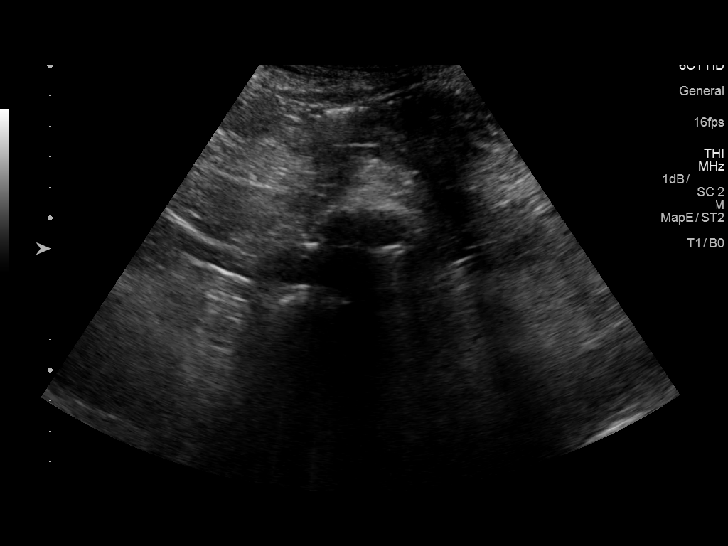

[14 of 25 positions shown; findings below may reference images not displayed]

FINDINGS: Portal Vein Velocities

Main:  18 cm/sec

Right:  24 cm/sec

Left:  20 cm/sec

Hepatic Vein Velocities

Right:  12 cm/sec

Middle:  14 cm/sec

Left:  18 cm/sec

Hepatic Artery Velocity:  69 cm/sec

Splenic Vein Velocity:  29 cm/sec

Varices: None

Ascites: None

Flow in all vessels in the respective anatomic directions.
IMPRESSION: Portal veins and hepatic veins as well splenic vein are all patent
with flow in respective anatomic directions. Velocity measurements
as described. No varices evident.

## 2018-02-13 DIAGNOSIS — H6060 Unspecified chronic otitis externa, unspecified ear: Secondary | ICD-10-CM | POA: Diagnosis not present

## 2018-02-13 DIAGNOSIS — H9203 Otalgia, bilateral: Secondary | ICD-10-CM | POA: Diagnosis not present

## 2018-02-20 DIAGNOSIS — J019 Acute sinusitis, unspecified: Secondary | ICD-10-CM | POA: Diagnosis not present

## 2018-02-20 DIAGNOSIS — J208 Acute bronchitis due to other specified organisms: Secondary | ICD-10-CM | POA: Diagnosis not present

## 2018-03-04 DIAGNOSIS — H903 Sensorineural hearing loss, bilateral: Secondary | ICD-10-CM | POA: Diagnosis not present

## 2018-03-04 DIAGNOSIS — J3089 Other allergic rhinitis: Secondary | ICD-10-CM | POA: Diagnosis not present

## 2018-03-04 DIAGNOSIS — H9313 Tinnitus, bilateral: Secondary | ICD-10-CM | POA: Insufficient documentation

## 2018-03-04 DIAGNOSIS — K219 Gastro-esophageal reflux disease without esophagitis: Secondary | ICD-10-CM | POA: Diagnosis not present

## 2018-03-23 ENCOUNTER — Encounter: Payer: Self-pay | Admitting: Gastroenterology

## 2018-03-23 DIAGNOSIS — I10 Essential (primary) hypertension: Secondary | ICD-10-CM | POA: Diagnosis not present

## 2018-03-23 DIAGNOSIS — E1140 Type 2 diabetes mellitus with diabetic neuropathy, unspecified: Secondary | ICD-10-CM | POA: Diagnosis not present

## 2018-03-23 DIAGNOSIS — D62 Acute posthemorrhagic anemia: Secondary | ICD-10-CM | POA: Diagnosis not present

## 2018-03-23 DIAGNOSIS — Z23 Encounter for immunization: Secondary | ICD-10-CM | POA: Diagnosis not present

## 2018-03-23 DIAGNOSIS — E039 Hypothyroidism, unspecified: Secondary | ICD-10-CM | POA: Diagnosis not present

## 2018-03-23 DIAGNOSIS — Z125 Encounter for screening for malignant neoplasm of prostate: Secondary | ICD-10-CM | POA: Diagnosis not present

## 2018-03-23 DIAGNOSIS — E785 Hyperlipidemia, unspecified: Secondary | ICD-10-CM | POA: Diagnosis not present

## 2018-03-26 DIAGNOSIS — E1142 Type 2 diabetes mellitus with diabetic polyneuropathy: Secondary | ICD-10-CM | POA: Diagnosis not present

## 2018-03-26 DIAGNOSIS — K7030 Alcoholic cirrhosis of liver without ascites: Secondary | ICD-10-CM | POA: Diagnosis not present

## 2018-03-26 DIAGNOSIS — E559 Vitamin D deficiency, unspecified: Secondary | ICD-10-CM | POA: Diagnosis not present

## 2018-03-26 DIAGNOSIS — R809 Proteinuria, unspecified: Secondary | ICD-10-CM | POA: Diagnosis not present

## 2018-03-26 DIAGNOSIS — E1165 Type 2 diabetes mellitus with hyperglycemia: Secondary | ICD-10-CM | POA: Diagnosis not present

## 2018-03-26 DIAGNOSIS — E039 Hypothyroidism, unspecified: Secondary | ICD-10-CM | POA: Diagnosis not present

## 2018-03-26 DIAGNOSIS — Z794 Long term (current) use of insulin: Secondary | ICD-10-CM | POA: Diagnosis not present

## 2018-03-26 DIAGNOSIS — B182 Chronic viral hepatitis C: Secondary | ICD-10-CM | POA: Diagnosis not present

## 2018-03-26 DIAGNOSIS — R801 Persistent proteinuria, unspecified: Secondary | ICD-10-CM | POA: Diagnosis not present

## 2018-03-26 DIAGNOSIS — R740 Nonspecific elevation of levels of transaminase and lactic acid dehydrogenase [LDH]: Secondary | ICD-10-CM | POA: Diagnosis not present

## 2018-03-26 DIAGNOSIS — E782 Mixed hyperlipidemia: Secondary | ICD-10-CM | POA: Diagnosis not present

## 2018-03-26 DIAGNOSIS — I10 Essential (primary) hypertension: Secondary | ICD-10-CM | POA: Diagnosis not present

## 2018-05-07 DIAGNOSIS — K7469 Other cirrhosis of liver: Secondary | ICD-10-CM | POA: Diagnosis not present

## 2018-05-07 DIAGNOSIS — M6208 Separation of muscle (nontraumatic), other site: Secondary | ICD-10-CM | POA: Diagnosis not present

## 2018-05-07 DIAGNOSIS — K7581 Nonalcoholic steatohepatitis (NASH): Secondary | ICD-10-CM | POA: Diagnosis not present

## 2018-05-11 ENCOUNTER — Other Ambulatory Visit: Payer: Self-pay | Admitting: Nurse Practitioner

## 2018-05-11 DIAGNOSIS — K7469 Other cirrhosis of liver: Secondary | ICD-10-CM

## 2018-05-25 ENCOUNTER — Ambulatory Visit
Admission: RE | Admit: 2018-05-25 | Discharge: 2018-05-25 | Disposition: A | Discharge status: Home / Self Care | Payer: Medicare Other | Source: Ambulatory Visit | Attending: Nurse Practitioner | Admitting: Nurse Practitioner

## 2018-05-25 DIAGNOSIS — K7469 Other cirrhosis of liver: Secondary | ICD-10-CM

## 2018-05-25 DIAGNOSIS — K7460 Unspecified cirrhosis of liver: Secondary | ICD-10-CM | POA: Diagnosis not present

## 2018-06-23 DIAGNOSIS — E1140 Type 2 diabetes mellitus with diabetic neuropathy, unspecified: Secondary | ICD-10-CM | POA: Diagnosis not present

## 2018-06-23 DIAGNOSIS — E785 Hyperlipidemia, unspecified: Secondary | ICD-10-CM | POA: Diagnosis not present

## 2018-06-23 DIAGNOSIS — I10 Essential (primary) hypertension: Secondary | ICD-10-CM | POA: Diagnosis not present

## 2018-06-23 DIAGNOSIS — D62 Acute posthemorrhagic anemia: Secondary | ICD-10-CM | POA: Diagnosis not present

## 2018-06-23 DIAGNOSIS — E039 Hypothyroidism, unspecified: Secondary | ICD-10-CM | POA: Diagnosis not present

## 2018-07-06 DIAGNOSIS — E782 Mixed hyperlipidemia: Secondary | ICD-10-CM | POA: Diagnosis not present

## 2018-07-06 DIAGNOSIS — E039 Hypothyroidism, unspecified: Secondary | ICD-10-CM | POA: Diagnosis not present

## 2018-07-06 DIAGNOSIS — E1165 Type 2 diabetes mellitus with hyperglycemia: Secondary | ICD-10-CM | POA: Diagnosis not present

## 2018-07-06 DIAGNOSIS — R801 Persistent proteinuria, unspecified: Secondary | ICD-10-CM | POA: Diagnosis not present

## 2018-07-06 DIAGNOSIS — I10 Essential (primary) hypertension: Secondary | ICD-10-CM | POA: Diagnosis not present

## 2018-07-21 DIAGNOSIS — J208 Acute bronchitis due to other specified organisms: Secondary | ICD-10-CM | POA: Diagnosis not present

## 2018-10-09 DIAGNOSIS — Z9181 History of falling: Secondary | ICD-10-CM | POA: Diagnosis not present

## 2018-10-09 DIAGNOSIS — E785 Hyperlipidemia, unspecified: Secondary | ICD-10-CM | POA: Diagnosis not present

## 2018-10-09 DIAGNOSIS — E1140 Type 2 diabetes mellitus with diabetic neuropathy, unspecified: Secondary | ICD-10-CM | POA: Diagnosis not present

## 2018-10-09 DIAGNOSIS — E039 Hypothyroidism, unspecified: Secondary | ICD-10-CM | POA: Diagnosis not present

## 2018-10-09 DIAGNOSIS — D62 Acute posthemorrhagic anemia: Secondary | ICD-10-CM | POA: Diagnosis not present

## 2018-10-09 DIAGNOSIS — Z139 Encounter for screening, unspecified: Secondary | ICD-10-CM | POA: Diagnosis not present

## 2018-10-22 DIAGNOSIS — I10 Essential (primary) hypertension: Secondary | ICD-10-CM | POA: Diagnosis not present

## 2018-10-22 DIAGNOSIS — E1165 Type 2 diabetes mellitus with hyperglycemia: Secondary | ICD-10-CM | POA: Diagnosis not present

## 2018-10-22 DIAGNOSIS — Z794 Long term (current) use of insulin: Secondary | ICD-10-CM | POA: Diagnosis not present

## 2018-10-22 DIAGNOSIS — K7469 Other cirrhosis of liver: Secondary | ICD-10-CM | POA: Diagnosis not present

## 2018-10-22 DIAGNOSIS — E1142 Type 2 diabetes mellitus with diabetic polyneuropathy: Secondary | ICD-10-CM | POA: Diagnosis not present

## 2018-10-22 DIAGNOSIS — E782 Mixed hyperlipidemia: Secondary | ICD-10-CM | POA: Diagnosis not present

## 2018-11-02 ENCOUNTER — Other Ambulatory Visit: Payer: Self-pay | Admitting: Nurse Practitioner

## 2018-11-02 DIAGNOSIS — K7469 Other cirrhosis of liver: Secondary | ICD-10-CM

## 2018-11-06 DIAGNOSIS — Z471 Aftercare following joint replacement surgery: Secondary | ICD-10-CM | POA: Diagnosis not present

## 2018-11-06 DIAGNOSIS — Z96652 Presence of left artificial knee joint: Secondary | ICD-10-CM | POA: Diagnosis not present

## 2018-11-06 DIAGNOSIS — M25561 Pain in right knee: Secondary | ICD-10-CM | POA: Diagnosis not present

## 2018-11-10 ENCOUNTER — Ambulatory Visit
Admission: RE | Admit: 2018-11-10 | Discharge: 2018-11-10 | Disposition: A | Discharge status: Home / Self Care | Payer: Medicare Other | Source: Ambulatory Visit | Attending: Nurse Practitioner | Admitting: Nurse Practitioner

## 2018-11-10 DIAGNOSIS — K7460 Unspecified cirrhosis of liver: Secondary | ICD-10-CM | POA: Diagnosis not present

## 2018-11-10 DIAGNOSIS — K7469 Other cirrhosis of liver: Secondary | ICD-10-CM

## 2018-11-17 DIAGNOSIS — E119 Type 2 diabetes mellitus without complications: Secondary | ICD-10-CM | POA: Diagnosis not present

## 2018-11-17 DIAGNOSIS — H04123 Dry eye syndrome of bilateral lacrimal glands: Secondary | ICD-10-CM | POA: Diagnosis not present

## 2018-11-17 DIAGNOSIS — H2513 Age-related nuclear cataract, bilateral: Secondary | ICD-10-CM | POA: Diagnosis not present

## 2018-11-19 DIAGNOSIS — D539 Nutritional anemia, unspecified: Secondary | ICD-10-CM | POA: Diagnosis not present

## 2018-12-07 DIAGNOSIS — M25562 Pain in left knee: Secondary | ICD-10-CM | POA: Diagnosis not present

## 2018-12-07 DIAGNOSIS — Z96652 Presence of left artificial knee joint: Secondary | ICD-10-CM | POA: Diagnosis not present

## 2018-12-07 DIAGNOSIS — R2689 Other abnormalities of gait and mobility: Secondary | ICD-10-CM | POA: Diagnosis not present

## 2018-12-07 DIAGNOSIS — M25462 Effusion, left knee: Secondary | ICD-10-CM | POA: Diagnosis not present

## 2018-12-07 DIAGNOSIS — M62552 Muscle wasting and atrophy, not elsewhere classified, left thigh: Secondary | ICD-10-CM | POA: Diagnosis not present

## 2018-12-11 DIAGNOSIS — M62552 Muscle wasting and atrophy, not elsewhere classified, left thigh: Secondary | ICD-10-CM | POA: Diagnosis not present

## 2018-12-11 DIAGNOSIS — Z96652 Presence of left artificial knee joint: Secondary | ICD-10-CM | POA: Diagnosis not present

## 2018-12-11 DIAGNOSIS — M25562 Pain in left knee: Secondary | ICD-10-CM | POA: Diagnosis not present

## 2018-12-11 DIAGNOSIS — M25462 Effusion, left knee: Secondary | ICD-10-CM | POA: Diagnosis not present

## 2018-12-11 DIAGNOSIS — R2689 Other abnormalities of gait and mobility: Secondary | ICD-10-CM | POA: Diagnosis not present

## 2018-12-15 DIAGNOSIS — R2689 Other abnormalities of gait and mobility: Secondary | ICD-10-CM | POA: Diagnosis not present

## 2018-12-15 DIAGNOSIS — Z96652 Presence of left artificial knee joint: Secondary | ICD-10-CM | POA: Diagnosis not present

## 2018-12-15 DIAGNOSIS — M62552 Muscle wasting and atrophy, not elsewhere classified, left thigh: Secondary | ICD-10-CM | POA: Diagnosis not present

## 2018-12-15 DIAGNOSIS — M25462 Effusion, left knee: Secondary | ICD-10-CM | POA: Diagnosis not present

## 2018-12-15 DIAGNOSIS — M25562 Pain in left knee: Secondary | ICD-10-CM | POA: Diagnosis not present

## 2018-12-17 DIAGNOSIS — Z96652 Presence of left artificial knee joint: Secondary | ICD-10-CM | POA: Diagnosis not present

## 2018-12-17 DIAGNOSIS — M25462 Effusion, left knee: Secondary | ICD-10-CM | POA: Diagnosis not present

## 2018-12-17 DIAGNOSIS — M25562 Pain in left knee: Secondary | ICD-10-CM | POA: Diagnosis not present

## 2018-12-17 DIAGNOSIS — R2689 Other abnormalities of gait and mobility: Secondary | ICD-10-CM | POA: Diagnosis not present

## 2018-12-17 DIAGNOSIS — M62552 Muscle wasting and atrophy, not elsewhere classified, left thigh: Secondary | ICD-10-CM | POA: Diagnosis not present

## 2018-12-25 DIAGNOSIS — M25462 Effusion, left knee: Secondary | ICD-10-CM | POA: Diagnosis not present

## 2018-12-25 DIAGNOSIS — M62552 Muscle wasting and atrophy, not elsewhere classified, left thigh: Secondary | ICD-10-CM | POA: Diagnosis not present

## 2018-12-25 DIAGNOSIS — J9811 Atelectasis: Secondary | ICD-10-CM | POA: Diagnosis not present

## 2018-12-25 DIAGNOSIS — Z96652 Presence of left artificial knee joint: Secondary | ICD-10-CM | POA: Diagnosis not present

## 2018-12-25 DIAGNOSIS — R9389 Abnormal findings on diagnostic imaging of other specified body structures: Secondary | ICD-10-CM | POA: Diagnosis not present

## 2018-12-25 DIAGNOSIS — R2689 Other abnormalities of gait and mobility: Secondary | ICD-10-CM | POA: Diagnosis not present

## 2018-12-25 DIAGNOSIS — M25562 Pain in left knee: Secondary | ICD-10-CM | POA: Diagnosis not present

## 2018-12-28 DIAGNOSIS — R2689 Other abnormalities of gait and mobility: Secondary | ICD-10-CM | POA: Diagnosis not present

## 2018-12-28 DIAGNOSIS — M62552 Muscle wasting and atrophy, not elsewhere classified, left thigh: Secondary | ICD-10-CM | POA: Diagnosis not present

## 2018-12-28 DIAGNOSIS — Z96652 Presence of left artificial knee joint: Secondary | ICD-10-CM | POA: Diagnosis not present

## 2018-12-28 DIAGNOSIS — M25562 Pain in left knee: Secondary | ICD-10-CM | POA: Diagnosis not present

## 2018-12-28 DIAGNOSIS — M25462 Effusion, left knee: Secondary | ICD-10-CM | POA: Diagnosis not present

## 2018-12-30 DIAGNOSIS — M25462 Effusion, left knee: Secondary | ICD-10-CM | POA: Diagnosis not present

## 2018-12-30 DIAGNOSIS — Z96652 Presence of left artificial knee joint: Secondary | ICD-10-CM | POA: Diagnosis not present

## 2018-12-30 DIAGNOSIS — M25562 Pain in left knee: Secondary | ICD-10-CM | POA: Diagnosis not present

## 2018-12-30 DIAGNOSIS — M62552 Muscle wasting and atrophy, not elsewhere classified, left thigh: Secondary | ICD-10-CM | POA: Diagnosis not present

## 2018-12-30 DIAGNOSIS — R2689 Other abnormalities of gait and mobility: Secondary | ICD-10-CM | POA: Diagnosis not present

## 2019-01-04 DIAGNOSIS — K048 Radicular cyst: Secondary | ICD-10-CM | POA: Diagnosis not present

## 2019-01-04 DIAGNOSIS — K136 Irritative hyperplasia of oral mucosa: Secondary | ICD-10-CM | POA: Diagnosis not present

## 2019-01-04 DIAGNOSIS — K046 Periapical abscess with sinus: Secondary | ICD-10-CM | POA: Diagnosis not present

## 2019-01-11 DIAGNOSIS — E039 Hypothyroidism, unspecified: Secondary | ICD-10-CM | POA: Diagnosis not present

## 2019-01-11 DIAGNOSIS — D62 Acute posthemorrhagic anemia: Secondary | ICD-10-CM | POA: Diagnosis not present

## 2019-01-11 DIAGNOSIS — I10 Essential (primary) hypertension: Secondary | ICD-10-CM | POA: Diagnosis not present

## 2019-01-11 DIAGNOSIS — E1140 Type 2 diabetes mellitus with diabetic neuropathy, unspecified: Secondary | ICD-10-CM | POA: Diagnosis not present

## 2019-01-11 DIAGNOSIS — E1165 Type 2 diabetes mellitus with hyperglycemia: Secondary | ICD-10-CM | POA: Diagnosis not present

## 2019-01-11 DIAGNOSIS — E785 Hyperlipidemia, unspecified: Secondary | ICD-10-CM | POA: Diagnosis not present

## 2019-02-11 DIAGNOSIS — Z0000 Encounter for general adult medical examination without abnormal findings: Secondary | ICD-10-CM | POA: Diagnosis not present

## 2019-02-11 DIAGNOSIS — Z9181 History of falling: Secondary | ICD-10-CM | POA: Diagnosis not present

## 2019-02-11 DIAGNOSIS — E785 Hyperlipidemia, unspecified: Secondary | ICD-10-CM | POA: Diagnosis not present

## 2019-02-23 DIAGNOSIS — Z794 Long term (current) use of insulin: Secondary | ICD-10-CM | POA: Diagnosis not present

## 2019-02-23 DIAGNOSIS — S025XXG Fracture of tooth (traumatic), subsequent encounter for fracture with delayed healing: Secondary | ICD-10-CM | POA: Diagnosis not present

## 2019-02-23 DIAGNOSIS — E1165 Type 2 diabetes mellitus with hyperglycemia: Secondary | ICD-10-CM | POA: Diagnosis not present

## 2019-02-23 DIAGNOSIS — M272 Inflammatory conditions of jaws: Secondary | ICD-10-CM | POA: Diagnosis not present

## 2019-03-02 DIAGNOSIS — M272 Inflammatory conditions of jaws: Secondary | ICD-10-CM | POA: Diagnosis not present

## 2019-03-02 DIAGNOSIS — J358 Other chronic diseases of tonsils and adenoids: Secondary | ICD-10-CM | POA: Diagnosis not present

## 2019-03-02 DIAGNOSIS — M47812 Spondylosis without myelopathy or radiculopathy, cervical region: Secondary | ICD-10-CM | POA: Diagnosis not present

## 2019-03-02 DIAGNOSIS — K0889 Other specified disorders of teeth and supporting structures: Secondary | ICD-10-CM | POA: Diagnosis not present

## 2019-03-08 DIAGNOSIS — M869 Osteomyelitis, unspecified: Secondary | ICD-10-CM | POA: Diagnosis not present

## 2019-03-08 DIAGNOSIS — Z452 Encounter for adjustment and management of vascular access device: Secondary | ICD-10-CM | POA: Diagnosis not present

## 2019-03-13 DIAGNOSIS — M869 Osteomyelitis, unspecified: Secondary | ICD-10-CM | POA: Diagnosis not present

## 2019-03-15 DIAGNOSIS — M869 Osteomyelitis, unspecified: Secondary | ICD-10-CM | POA: Diagnosis not present

## 2019-03-19 DIAGNOSIS — M869 Osteomyelitis, unspecified: Secondary | ICD-10-CM | POA: Diagnosis not present

## 2019-03-22 DIAGNOSIS — M869 Osteomyelitis, unspecified: Secondary | ICD-10-CM | POA: Diagnosis not present

## 2019-03-23 DIAGNOSIS — Z792 Long term (current) use of antibiotics: Secondary | ICD-10-CM | POA: Diagnosis not present

## 2019-03-23 DIAGNOSIS — M272 Inflammatory conditions of jaws: Secondary | ICD-10-CM | POA: Diagnosis not present

## 2019-03-25 DIAGNOSIS — M869 Osteomyelitis, unspecified: Secondary | ICD-10-CM | POA: Diagnosis not present

## 2019-03-31 DIAGNOSIS — M869 Osteomyelitis, unspecified: Secondary | ICD-10-CM | POA: Diagnosis not present

## 2019-04-05 DIAGNOSIS — M869 Osteomyelitis, unspecified: Secondary | ICD-10-CM | POA: Diagnosis not present

## 2019-04-06 DIAGNOSIS — M869 Osteomyelitis, unspecified: Secondary | ICD-10-CM | POA: Diagnosis not present

## 2019-04-10 DIAGNOSIS — M869 Osteomyelitis, unspecified: Secondary | ICD-10-CM | POA: Diagnosis not present

## 2019-04-12 DIAGNOSIS — M899 Disorder of bone, unspecified: Secondary | ICD-10-CM | POA: Diagnosis not present

## 2019-04-12 DIAGNOSIS — M869 Osteomyelitis, unspecified: Secondary | ICD-10-CM | POA: Diagnosis not present

## 2019-04-14 DIAGNOSIS — D62 Acute posthemorrhagic anemia: Secondary | ICD-10-CM | POA: Diagnosis not present

## 2019-04-14 DIAGNOSIS — I10 Essential (primary) hypertension: Secondary | ICD-10-CM | POA: Diagnosis not present

## 2019-04-14 DIAGNOSIS — E785 Hyperlipidemia, unspecified: Secondary | ICD-10-CM | POA: Diagnosis not present

## 2019-04-14 DIAGNOSIS — E039 Hypothyroidism, unspecified: Secondary | ICD-10-CM | POA: Diagnosis not present

## 2019-04-14 DIAGNOSIS — E1142 Type 2 diabetes mellitus with diabetic polyneuropathy: Secondary | ICD-10-CM | POA: Diagnosis not present

## 2019-04-14 DIAGNOSIS — E1165 Type 2 diabetes mellitus with hyperglycemia: Secondary | ICD-10-CM | POA: Diagnosis not present

## 2019-04-14 IMAGING — US US ABDOMEN LIMITED
1 series · 14 of 25 positions shown · non-contrast
Comparison: Ultrasound April 22, 2017.

CLINICAL DATA: [place] cirrhosis.

EXAM:
ULTRASOUND ABDOMEN LIMITED RIGHT UPPER QUADRANT

[Series 1: us abdomen limited · 0.19mm/px · 14 of 50 slices shown]
[im 1/50]
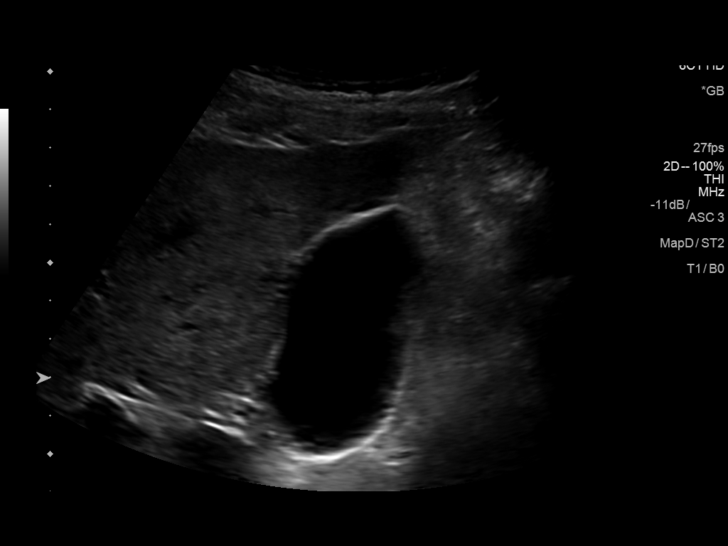
[im 5/50]
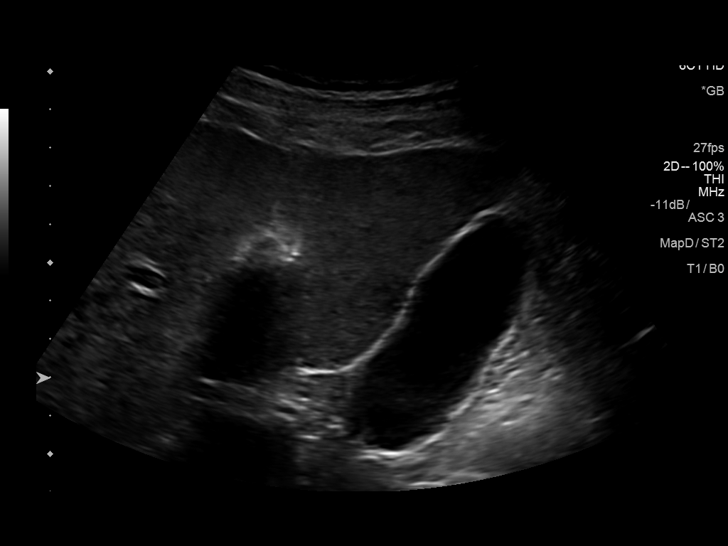
[im 9/50]
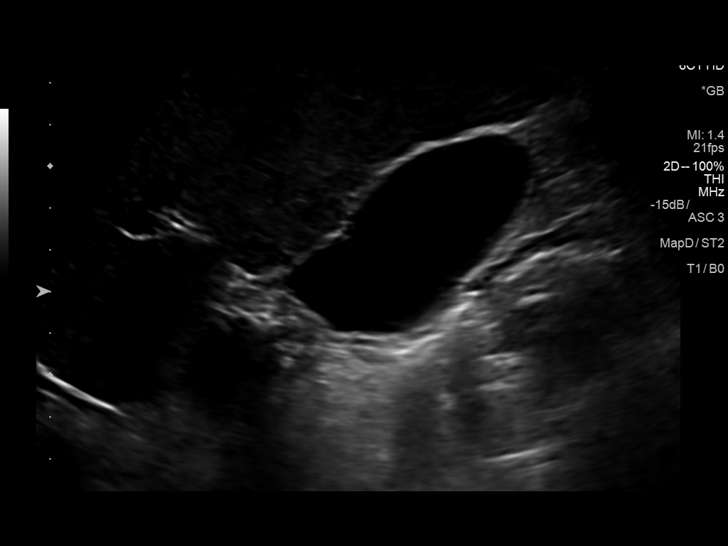
[im 13/50]
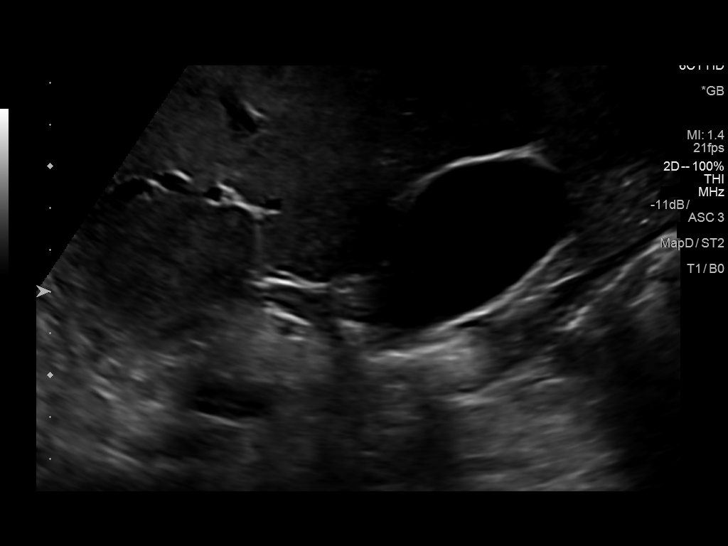
[im 17/50]
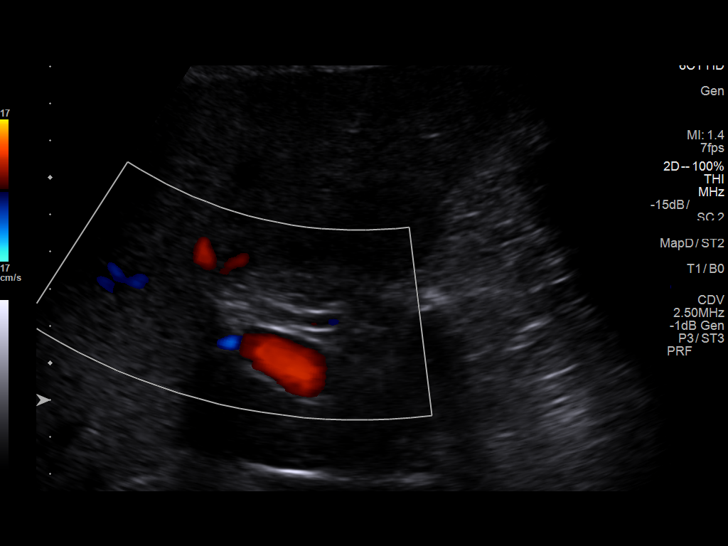
[im 19/50]
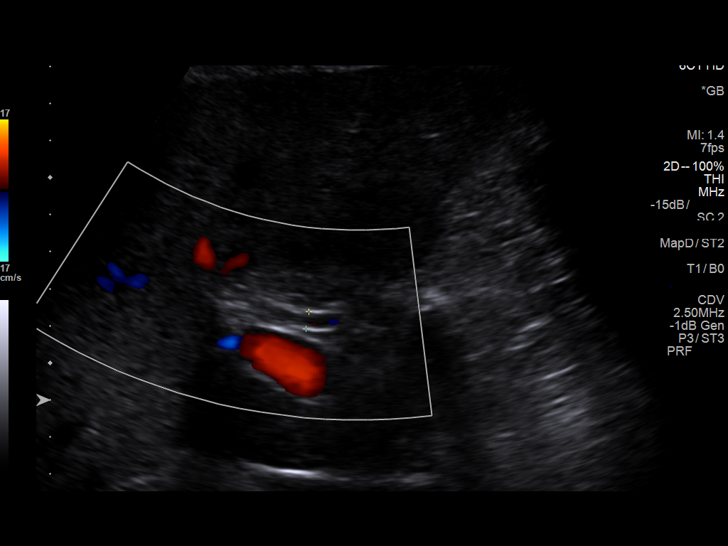
[im 23/50]
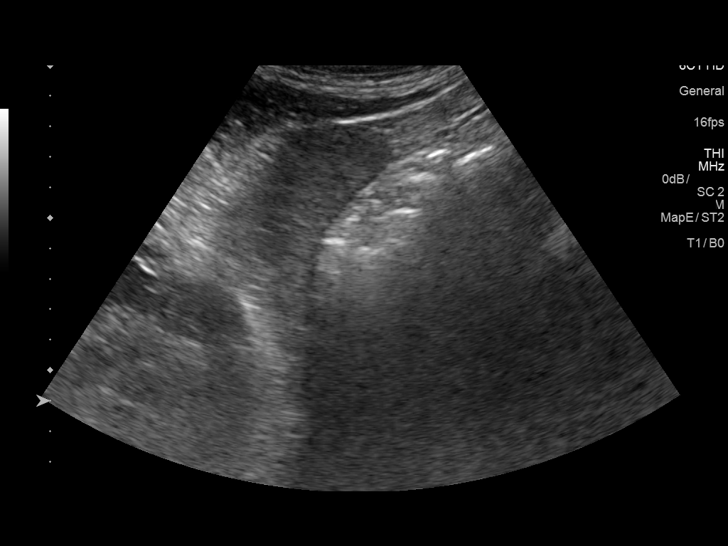
[im 27/50]
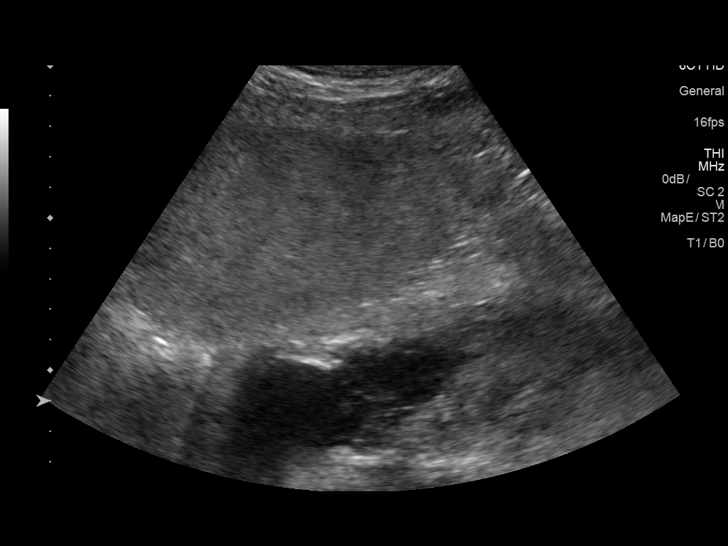
[im 31/50]
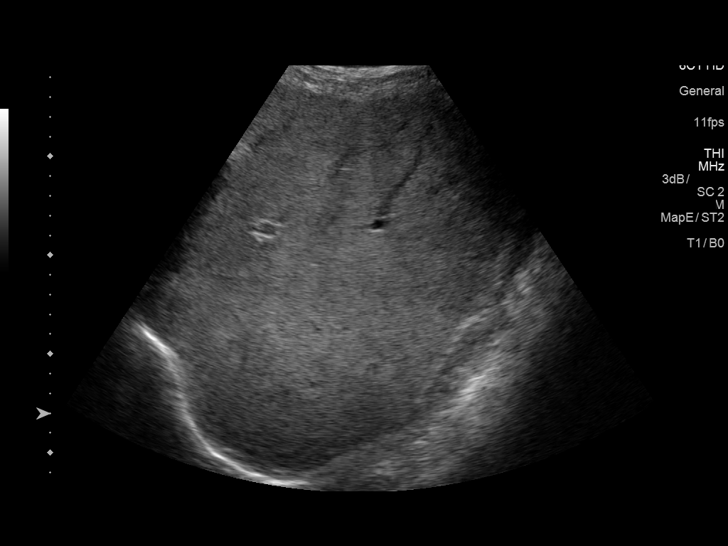
[im 33/50]
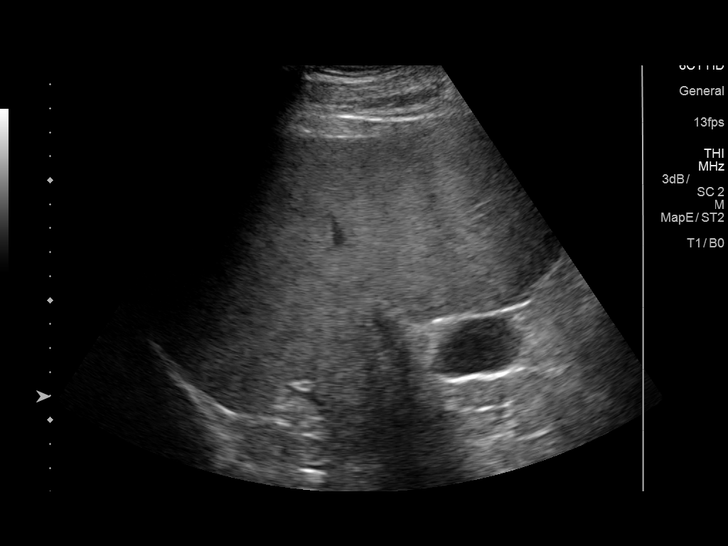
[im 37/50]
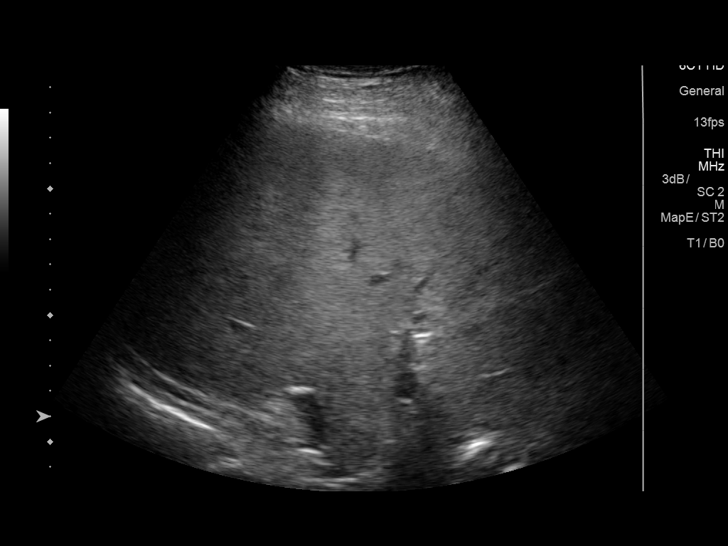
[im 41/50]
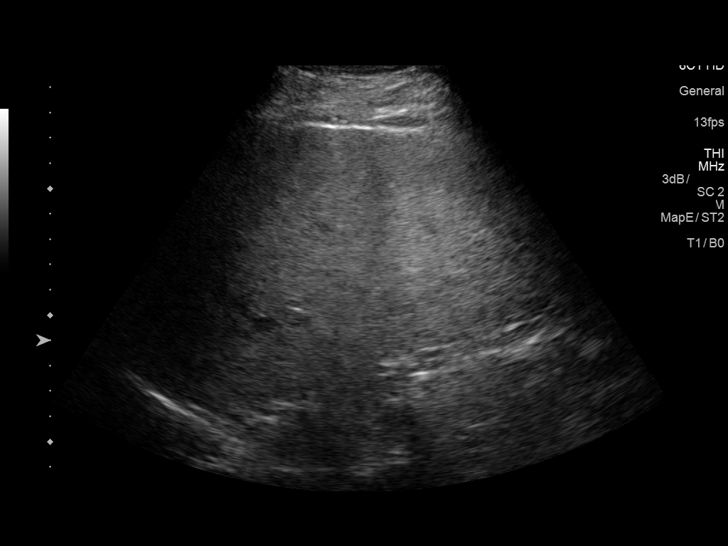
[im 45/50]
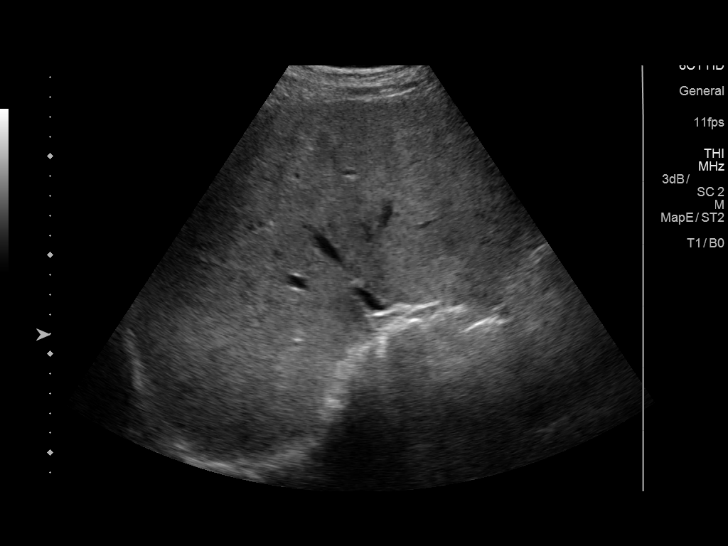
[im 50/50]
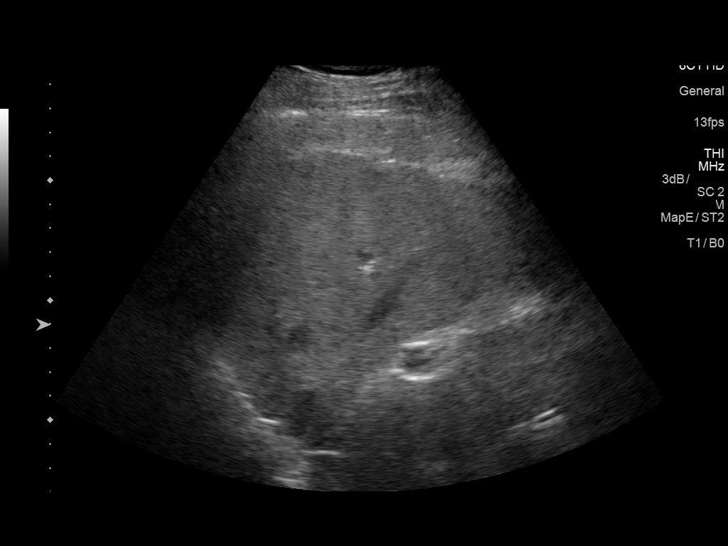

[14 of 25 positions shown; findings below may reference images not displayed]

FINDINGS: Gallbladder:

No gallstones or wall thickening visualized. No sonographic Murphy
sign noted by sonographer.

Common bile duct:

Diameter: 4.7 mm which is within normal limits.

Liver:

Heterogeneous echotexture of hepatic parenchyma is noted with mildly
nodular hepatic contours suggesting hepatic cirrhosis. Normal
antegrade flow is noted in portal vein.
IMPRESSION: Heterogeneous echotexture hepatic parenchyma is noted with mildly
nodular hepatic contours suggesting hepatic cirrhosis. No other
abnormality seen in the right upper quadrant of the abdomen.

## 2019-04-15 DIAGNOSIS — M869 Osteomyelitis, unspecified: Secondary | ICD-10-CM | POA: Diagnosis not present

## 2019-04-19 DIAGNOSIS — M869 Osteomyelitis, unspecified: Secondary | ICD-10-CM | POA: Diagnosis not present

## 2019-04-20 DIAGNOSIS — M869 Osteomyelitis, unspecified: Secondary | ICD-10-CM | POA: Diagnosis not present

## 2019-04-28 DIAGNOSIS — K134 Granuloma and granuloma-like lesions of oral mucosa: Secondary | ICD-10-CM | POA: Diagnosis not present

## 2019-04-28 DIAGNOSIS — E119 Type 2 diabetes mellitus without complications: Secondary | ICD-10-CM | POA: Diagnosis not present

## 2019-04-28 DIAGNOSIS — L929 Granulomatous disorder of the skin and subcutaneous tissue, unspecified: Secondary | ICD-10-CM | POA: Diagnosis not present

## 2019-04-28 DIAGNOSIS — K7460 Unspecified cirrhosis of liver: Secondary | ICD-10-CM | POA: Diagnosis not present

## 2019-04-28 DIAGNOSIS — M272 Inflammatory conditions of jaws: Secondary | ICD-10-CM | POA: Diagnosis not present

## 2019-04-28 DIAGNOSIS — L089 Local infection of the skin and subcutaneous tissue, unspecified: Secondary | ICD-10-CM | POA: Diagnosis not present

## 2019-04-29 ENCOUNTER — Ambulatory Visit: Payer: Self-pay | Admitting: Sports Medicine

## 2019-04-29 DIAGNOSIS — K3189 Other diseases of stomach and duodenum: Secondary | ICD-10-CM | POA: Diagnosis not present

## 2019-04-29 DIAGNOSIS — K7469 Other cirrhosis of liver: Secondary | ICD-10-CM | POA: Diagnosis not present

## 2019-04-30 ENCOUNTER — Other Ambulatory Visit: Payer: Self-pay

## 2019-04-30 ENCOUNTER — Ambulatory Visit: Payer: Medicare Other | Admitting: Sports Medicine

## 2019-04-30 ENCOUNTER — Other Ambulatory Visit: Payer: Self-pay | Admitting: Nurse Practitioner

## 2019-04-30 ENCOUNTER — Encounter: Payer: Self-pay | Admitting: Sports Medicine

## 2019-04-30 DIAGNOSIS — M79675 Pain in left toe(s): Secondary | ICD-10-CM

## 2019-04-30 DIAGNOSIS — K7469 Other cirrhosis of liver: Secondary | ICD-10-CM

## 2019-04-30 DIAGNOSIS — B351 Tinea unguium: Secondary | ICD-10-CM

## 2019-04-30 DIAGNOSIS — M79674 Pain in right toe(s): Secondary | ICD-10-CM

## 2019-04-30 DIAGNOSIS — E1142 Type 2 diabetes mellitus with diabetic polyneuropathy: Secondary | ICD-10-CM | POA: Diagnosis not present

## 2019-04-30 NOTE — Progress Notes (Signed)
Subjective: Benjamin Aguilar is a 68 y.o. male patient with history of diabetes who presents to office today complaining of long,mildly painful nails  while ambulating in shoes; unable to trim. Patient states that the glucose reading this morning was 131 mg/dl.  Last A1c 6.5-7 range and last PCP visit was 1 month ago.  Patient also admits to some dryness to his skin but otherwise denies any other pedal complaints at this time.   Review of Systems  All other systems reviewed and are negative.    Patient Active Problem List   Diagnosis Date Noted  . Laryngopharyngeal reflux (LPR) 03/04/2018  . Perennial allergic rhinitis 03/04/2018  . Sensorineural hearing loss (SNHL), bilateral 03/04/2018  . Tinnitus, bilateral 03/04/2018  . Presence of left artificial knee joint 07/21/2017  . Diabetic polyneuropathy ([place]) 11/18/2016  . Type 2 diabetes mellitus ([place]) 07/05/2016  . Alcoholic cirrhosis ([place]) [id number]  . Chronic hepatitis C ([place]) 07/05/2016  . Hypertension, essential 07/05/2016  . Persistent proteinuria 07/05/2016  . Mixed dyslipidemia 07/05/2016  . Nonspecific elevation of levels of transaminase or lactic acid dehydrogenase (LDH) 07/05/2016  . Primary hypothyroidism 07/05/2016  . Vitamin D insufficiency 07/05/2016  . OA (osteoarthritis) of knee 05/06/2016   Current Outpatient Medications on File Prior to Visit  Medication Sig Dispense Refill  . amLODipine (NORVASC) 5 MG tablet Take 5 mg by mouth daily.    . canagliflozin (INVOKANA) 300 MG TABS tablet Take 300 mg by mouth daily before breakfast.    . DULoxetine (CYMBALTA) 30 MG capsule Take 30 mg by mouth daily.    Marland Kitchen ezetimibe (ZETIA) 10 MG tablet Take 10 mg by mouth daily.    Marland Kitchen glipiZIDE (GLUCOTROL) 10 MG tablet Take 10 mg by mouth 2 (two) times daily before a meal.     . LEVEMIR FLEXTOUCH 100 UNIT/ML Pen Inject 60 Units into the skin at bedtime.    Marland Kitchen levothyroxine (SYNTHROID, LEVOTHROID) 112 MCG tablet Take 112 mcg by mouth daily  before breakfast.    . lisinopril (PRINIVIL,ZESTRIL) 40 MG tablet Take 40 mg by mouth at bedtime.    . metFORMIN (GLUCOPHAGE) 1000 MG tablet Take 1,000 mg by mouth 2 (two) times daily.    . methocarbamol (ROBAXIN) 500 MG tablet Take 1 tablet (500 mg total) by mouth every 6 (six) hours as needed for muscle spasms. 80 tablet 0  . metoprolol succinate (TOPROL-XL) 100 MG 24 hr tablet Take 100 mg by mouth daily.    Marland Kitchen omeprazole (PRILOSEC) 20 MG capsule Take 20 mg by mouth daily.    Marland Kitchen oxyCODONE (OXY IR/ROXICODONE) 5 MG immediate release tablet Take 1-2 tablets (5-10 mg total) by mouth every 3 (three) hours as needed for moderate pain or severe pain. 80 tablet 0  . pregabalin (LYRICA) 300 MG capsule Take 300 mg by mouth 2 (two) times daily.    . rivaroxaban (XARELTO) 10 MG TABS tablet Take 10 mg by mouth daily with breakfast.    . traMADol (ULTRAM) 50 MG tablet Take 1-2 tablets (50-100 mg total) by mouth every 6 (six) hours as needed for moderate pain. 80 tablet 1  . VICTOZA 18 MG/3ML SOPN Inject 1.8 mg into the skin daily before breakfast.    . WELCHOL 625 MG tablet Take 1,875 mg by mouth 2 (two) times daily.     No current facility-administered medications on file prior to visit.   Allergies  Allergen Reactions  . Alcohol Other (See Comments)    Pt is a recovering alcoholic --  prefers to not have any medication with alcohol in it.     No results found for this or any previous visit (from the past 2160 hour(s)).  Objective: General: Patient is awake, alert, and oriented x 3 and in no acute distress.  Integument: Skin is warm, dry and supple bilateral. Nails are tender, long, thickened and dystrophic with subungual debris, consistent with onychomycosis, 1-5 bilateral. No signs of infection. No open lesions or preulcerative lesions present bilateral. Remaining integument unremarkable.  Vasculature:  Dorsalis Pedis pulse 1/4 bilateral. Posterior Tibial pulse 1/4 bilateral. Capillary fill time <3  sec 1-5 bilateral. Positive hair growth to the level of the digits.Temperature gradient within normal limits. No varicosities present bilateral. No edema present bilateral.   Neurology: The patient has diminished sensation measured with a 5.07/10g Semmes Weinstein Monofilament at all pedal sites bilateral. Vibratory sensation diminished bilateral with tuning fork. No Babinski sign present bilateral.   Musculoskeletal: Asymptomatic planus pedal deformities noted bilateral. Muscular strength 5/5 in all lower extremity muscular groups bilateral without pain on range of motion. No tenderness with calf compression bilateral.  Assessment and Plan: Problem List Items Addressed This Visit      Endocrine   Diabetic polyneuropathy ([place])    Other Visit Diagnoses    Pain due to onychomycosis of toenails of both feet    -  Primary      -Examined patient. -Discussed and educated patient on diabetic foot care, especially with  regards to the vascular, neurological and musculoskeletal systems.  -Stressed the importance of good glycemic control and the detriment of not  controlling glucose levels in relation to the foot. -Mechanically debrided all nails 1-5 bilateral using sterile nail nipper and filed with dremel without incident  -Recommend Foot miracle cream; sample provided  -Answered all patient questions -Patient to return  in 3 months for at risk foot care -Patient advised to call the office if any problems or questions arise in the meantime.  Landis Martins, DPM

## 2019-04-30 NOTE — Patient Instructions (Addendum)
Recommend alpha lipoic acid 600 mg daily for neuropathy

## 2019-05-03 DIAGNOSIS — M8628 Subacute osteomyelitis, other site: Secondary | ICD-10-CM | POA: Diagnosis not present

## 2019-05-05 DIAGNOSIS — Z0389 Encounter for observation for other suspected diseases and conditions ruled out: Secondary | ICD-10-CM | POA: Diagnosis not present

## 2019-05-05 DIAGNOSIS — S0180XA Unspecified open wound of other part of head, initial encounter: Secondary | ICD-10-CM | POA: Diagnosis not present

## 2019-05-20 DIAGNOSIS — R6889 Other general symptoms and signs: Secondary | ICD-10-CM | POA: Diagnosis not present

## 2019-05-20 DIAGNOSIS — U071 COVID-19: Secondary | ICD-10-CM | POA: Diagnosis not present

## 2019-05-28 ENCOUNTER — Ambulatory Visit
Admission: RE | Admit: 2019-05-28 | Discharge: 2019-05-28 | Disposition: A | Discharge status: Home / Self Care | Payer: Medicare Other | Source: Ambulatory Visit | Attending: Nurse Practitioner | Admitting: Nurse Practitioner

## 2019-05-28 DIAGNOSIS — K7469 Other cirrhosis of liver: Secondary | ICD-10-CM

## 2019-05-28 DIAGNOSIS — K7689 Other specified diseases of liver: Secondary | ICD-10-CM | POA: Diagnosis not present

## 2019-07-19 DIAGNOSIS — E039 Hypothyroidism, unspecified: Secondary | ICD-10-CM | POA: Diagnosis not present

## 2019-07-19 DIAGNOSIS — I10 Essential (primary) hypertension: Secondary | ICD-10-CM | POA: Diagnosis not present

## 2019-07-19 DIAGNOSIS — E1142 Type 2 diabetes mellitus with diabetic polyneuropathy: Secondary | ICD-10-CM | POA: Diagnosis not present

## 2019-07-19 DIAGNOSIS — E1165 Type 2 diabetes mellitus with hyperglycemia: Secondary | ICD-10-CM | POA: Diagnosis not present

## 2019-07-19 DIAGNOSIS — D62 Acute posthemorrhagic anemia: Secondary | ICD-10-CM | POA: Diagnosis not present

## 2019-07-19 DIAGNOSIS — E785 Hyperlipidemia, unspecified: Secondary | ICD-10-CM | POA: Diagnosis not present

## 2019-07-19 DIAGNOSIS — Z23 Encounter for immunization: Secondary | ICD-10-CM | POA: Diagnosis not present

## 2019-07-29 ENCOUNTER — Ambulatory Visit: Payer: Medicare Other | Admitting: Sports Medicine

## 2019-08-03 DIAGNOSIS — H9210 Otorrhea, unspecified ear: Secondary | ICD-10-CM | POA: Diagnosis not present

## 2019-08-03 DIAGNOSIS — E119 Type 2 diabetes mellitus without complications: Secondary | ICD-10-CM | POA: Diagnosis not present

## 2019-08-03 DIAGNOSIS — L299 Pruritus, unspecified: Secondary | ICD-10-CM | POA: Diagnosis not present

## 2019-08-03 DIAGNOSIS — Z794 Long term (current) use of insulin: Secondary | ICD-10-CM | POA: Diagnosis not present

## 2019-09-07 DIAGNOSIS — E119 Type 2 diabetes mellitus without complications: Secondary | ICD-10-CM | POA: Diagnosis not present

## 2019-09-07 DIAGNOSIS — H04123 Dry eye syndrome of bilateral lacrimal glands: Secondary | ICD-10-CM | POA: Diagnosis not present

## 2019-09-07 DIAGNOSIS — H524 Presbyopia: Secondary | ICD-10-CM | POA: Diagnosis not present

## 2019-09-07 DIAGNOSIS — H25813 Combined forms of age-related cataract, bilateral: Secondary | ICD-10-CM | POA: Diagnosis not present

## 2019-10-06 DIAGNOSIS — R6889 Other general symptoms and signs: Secondary | ICD-10-CM | POA: Diagnosis not present

## 2019-10-06 DIAGNOSIS — B349 Viral infection, unspecified: Secondary | ICD-10-CM | POA: Diagnosis not present

## 2019-10-20 ENCOUNTER — Other Ambulatory Visit: Payer: Self-pay | Admitting: Nurse Practitioner

## 2019-10-20 DIAGNOSIS — K7469 Other cirrhosis of liver: Secondary | ICD-10-CM

## 2019-10-20 DIAGNOSIS — K3189 Other diseases of stomach and duodenum: Secondary | ICD-10-CM | POA: Diagnosis not present

## 2019-10-28 ENCOUNTER — Ambulatory Visit
Admission: RE | Admit: 2019-10-28 | Discharge: 2019-10-28 | Disposition: A | Discharge status: Home / Self Care | Payer: Medicare Other | Source: Ambulatory Visit | Attending: Nurse Practitioner | Admitting: Nurse Practitioner

## 2019-10-28 DIAGNOSIS — K7469 Other cirrhosis of liver: Secondary | ICD-10-CM

## 2019-10-28 DIAGNOSIS — K7460 Unspecified cirrhosis of liver: Secondary | ICD-10-CM | POA: Diagnosis not present

## 2019-11-03 DIAGNOSIS — R600 Localized edema: Secondary | ICD-10-CM | POA: Diagnosis not present

## 2019-11-03 DIAGNOSIS — L03115 Cellulitis of right lower limb: Secondary | ICD-10-CM | POA: Diagnosis not present

## 2019-11-03 DIAGNOSIS — L03116 Cellulitis of left lower limb: Secondary | ICD-10-CM | POA: Diagnosis not present

## 2019-11-10 DIAGNOSIS — D62 Acute posthemorrhagic anemia: Secondary | ICD-10-CM | POA: Diagnosis not present

## 2019-11-10 DIAGNOSIS — I10 Essential (primary) hypertension: Secondary | ICD-10-CM | POA: Diagnosis not present

## 2019-11-10 DIAGNOSIS — E1165 Type 2 diabetes mellitus with hyperglycemia: Secondary | ICD-10-CM | POA: Diagnosis not present

## 2019-11-10 DIAGNOSIS — E785 Hyperlipidemia, unspecified: Secondary | ICD-10-CM | POA: Diagnosis not present

## 2019-11-10 DIAGNOSIS — E039 Hypothyroidism, unspecified: Secondary | ICD-10-CM | POA: Diagnosis not present

## 2019-11-10 DIAGNOSIS — Z139 Encounter for screening, unspecified: Secondary | ICD-10-CM | POA: Diagnosis not present

## 2019-11-10 DIAGNOSIS — E1142 Type 2 diabetes mellitus with diabetic polyneuropathy: Secondary | ICD-10-CM | POA: Diagnosis not present

## 2019-11-19 DIAGNOSIS — R809 Proteinuria, unspecified: Secondary | ICD-10-CM | POA: Diagnosis not present

## 2019-11-19 DIAGNOSIS — E1142 Type 2 diabetes mellitus with diabetic polyneuropathy: Secondary | ICD-10-CM | POA: Diagnosis not present

## 2019-11-19 DIAGNOSIS — I10 Essential (primary) hypertension: Secondary | ICD-10-CM | POA: Diagnosis not present

## 2019-11-19 DIAGNOSIS — E782 Mixed hyperlipidemia: Secondary | ICD-10-CM | POA: Diagnosis not present

## 2020-02-15 DIAGNOSIS — D62 Acute posthemorrhagic anemia: Secondary | ICD-10-CM | POA: Diagnosis not present

## 2020-02-15 DIAGNOSIS — E785 Hyperlipidemia, unspecified: Secondary | ICD-10-CM | POA: Diagnosis not present

## 2020-02-15 DIAGNOSIS — E1165 Type 2 diabetes mellitus with hyperglycemia: Secondary | ICD-10-CM | POA: Diagnosis not present

## 2020-02-15 DIAGNOSIS — I10 Essential (primary) hypertension: Secondary | ICD-10-CM | POA: Diagnosis not present

## 2020-02-15 DIAGNOSIS — Z9181 History of falling: Secondary | ICD-10-CM | POA: Diagnosis not present

## 2020-02-15 DIAGNOSIS — E039 Hypothyroidism, unspecified: Secondary | ICD-10-CM | POA: Diagnosis not present

## 2020-02-15 DIAGNOSIS — E1142 Type 2 diabetes mellitus with diabetic polyneuropathy: Secondary | ICD-10-CM | POA: Diagnosis not present

## 2020-05-25 DIAGNOSIS — D62 Acute posthemorrhagic anemia: Secondary | ICD-10-CM | POA: Diagnosis not present

## 2020-05-25 DIAGNOSIS — I10 Essential (primary) hypertension: Secondary | ICD-10-CM | POA: Diagnosis not present

## 2020-05-25 DIAGNOSIS — E1165 Type 2 diabetes mellitus with hyperglycemia: Secondary | ICD-10-CM | POA: Diagnosis not present

## 2020-05-25 DIAGNOSIS — E039 Hypothyroidism, unspecified: Secondary | ICD-10-CM | POA: Diagnosis not present

## 2020-05-25 DIAGNOSIS — E785 Hyperlipidemia, unspecified: Secondary | ICD-10-CM | POA: Diagnosis not present

## 2020-05-25 DIAGNOSIS — E1142 Type 2 diabetes mellitus with diabetic polyneuropathy: Secondary | ICD-10-CM | POA: Diagnosis not present

## 2020-08-24 DIAGNOSIS — I10 Essential (primary) hypertension: Secondary | ICD-10-CM | POA: Diagnosis not present

## 2020-08-24 DIAGNOSIS — E039 Hypothyroidism, unspecified: Secondary | ICD-10-CM | POA: Diagnosis not present

## 2020-08-24 DIAGNOSIS — E13610 Other specified diabetes mellitus with diabetic neuropathic arthropathy: Secondary | ICD-10-CM | POA: Diagnosis not present

## 2020-08-24 DIAGNOSIS — Z1211 Encounter for screening for malignant neoplasm of colon: Secondary | ICD-10-CM | POA: Diagnosis not present

## 2020-08-24 DIAGNOSIS — E785 Hyperlipidemia, unspecified: Secondary | ICD-10-CM | POA: Diagnosis not present

## 2020-08-24 DIAGNOSIS — D62 Acute posthemorrhagic anemia: Secondary | ICD-10-CM | POA: Diagnosis not present

## 2020-08-24 DIAGNOSIS — Z720 Tobacco use: Secondary | ICD-10-CM | POA: Diagnosis not present

## 2020-10-09 DIAGNOSIS — M272 Inflammatory conditions of jaws: Secondary | ICD-10-CM | POA: Diagnosis not present

## 2020-10-20 IMAGING — US US ABDOMEN LIMITED
2 series · 14 of 25 positions shown · non-contrast
Comparison: 11/10/2018

CLINICAL DATA: Evaluate cirrhosis.

EXAM:
ULTRASOUND ABDOMEN LIMITED RIGHT UPPER QUADRANT

[Series 1: us abdomen limited · 0.33mm/px · 13 of 34 slices shown (1 of 2)]
[im 1/34]
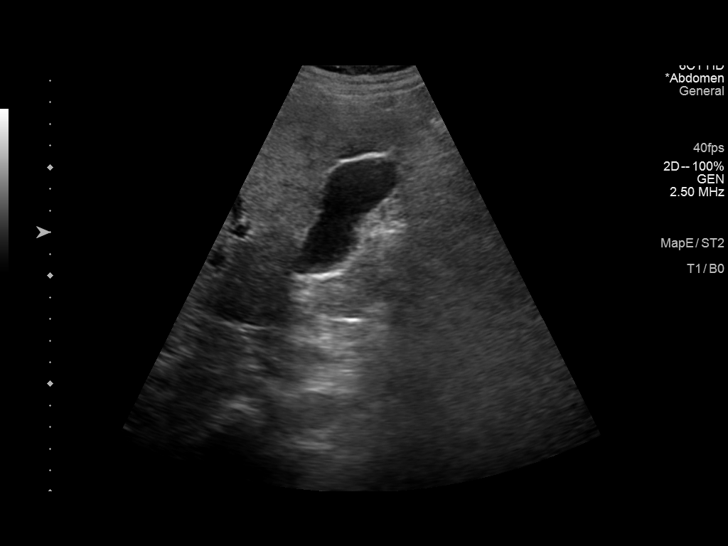
[im 3/34]
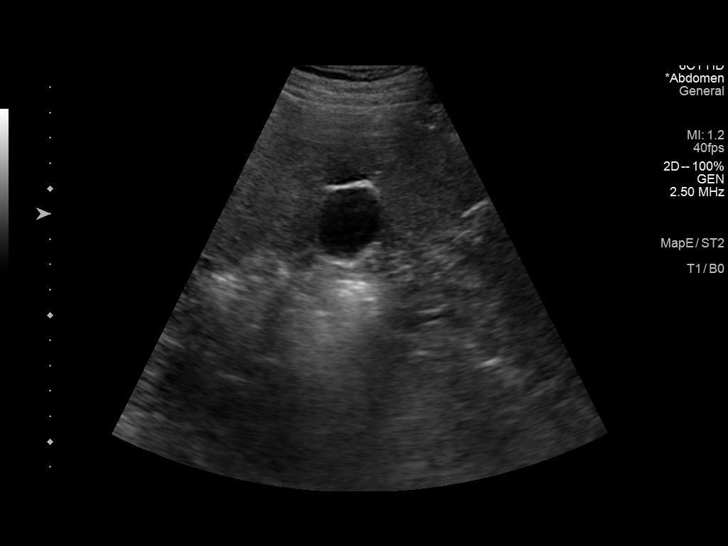
[im 6/34]
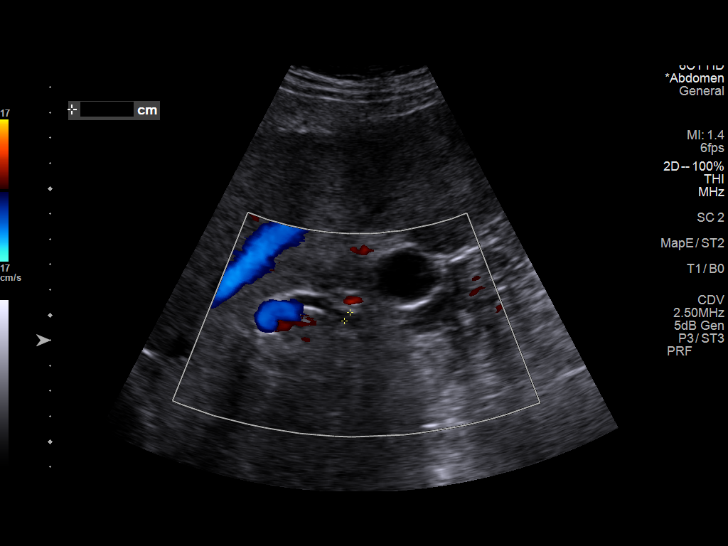
[im 9/34]
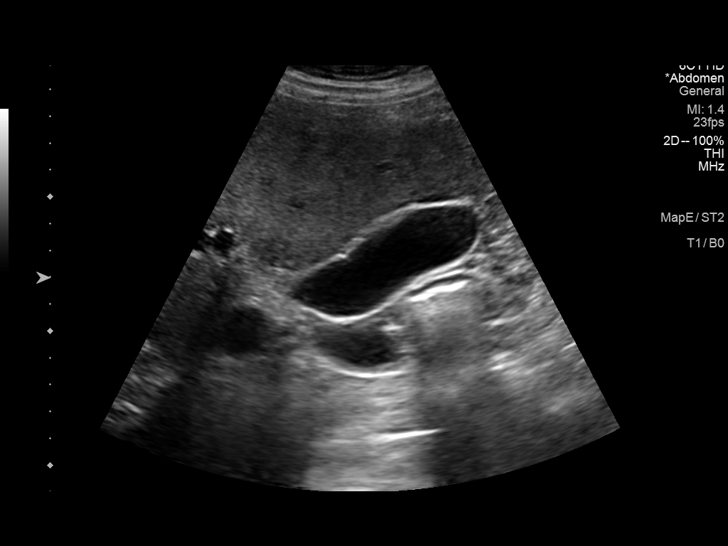
[im 12/34]
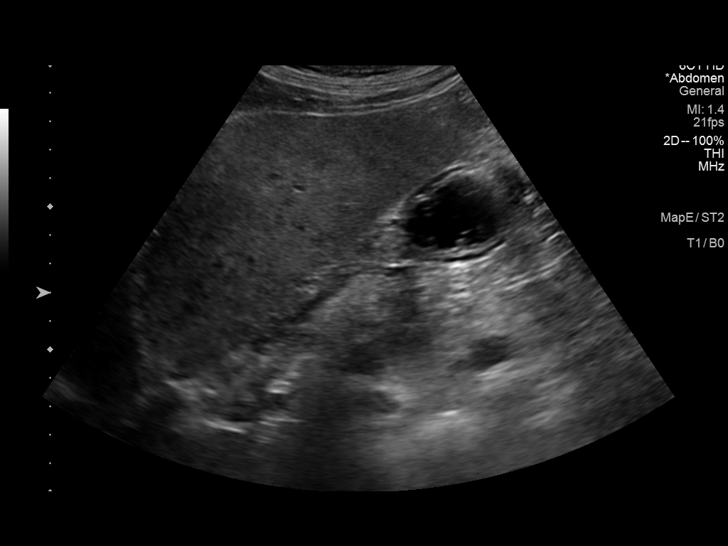
[im 13/34]
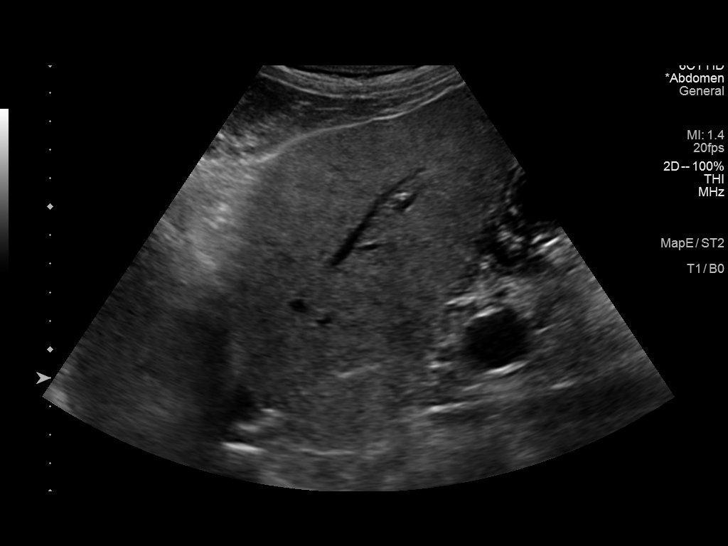
[im 16/34]
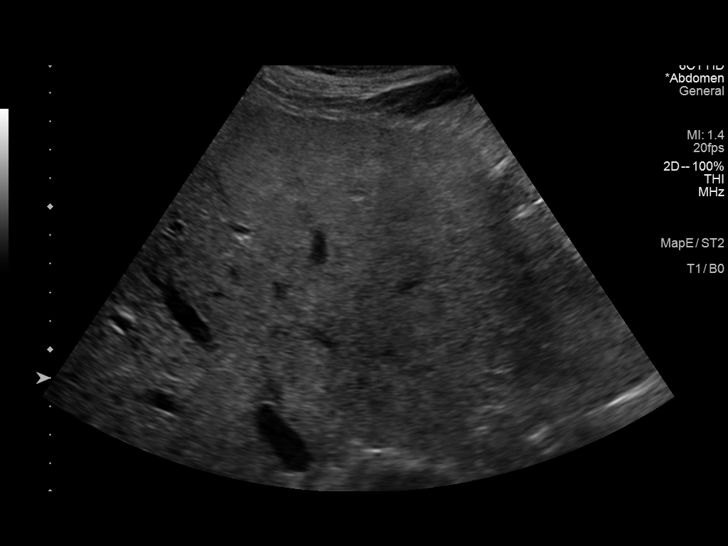
[im 19/34]
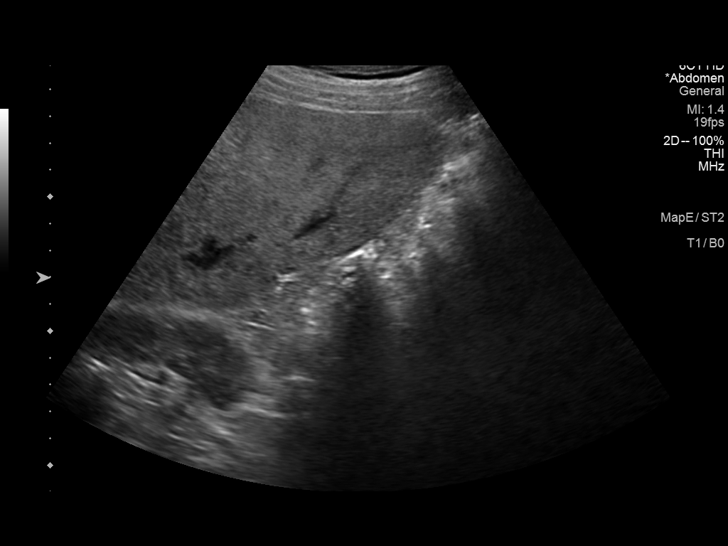
[im 22/34]
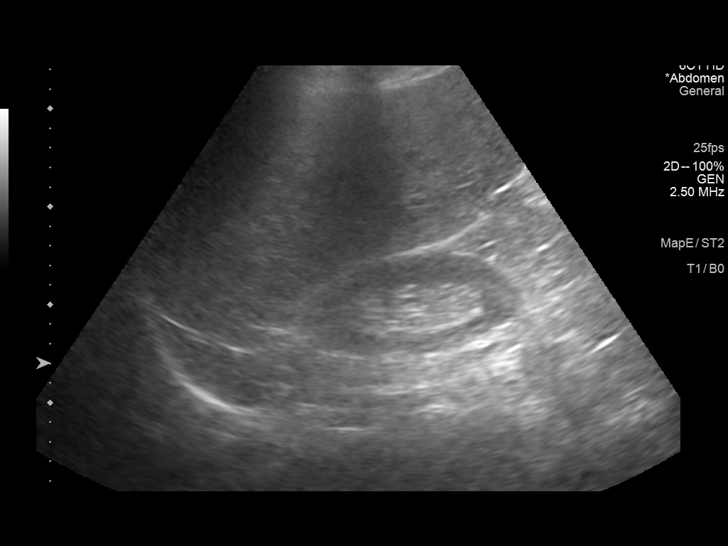
[im 23/34]
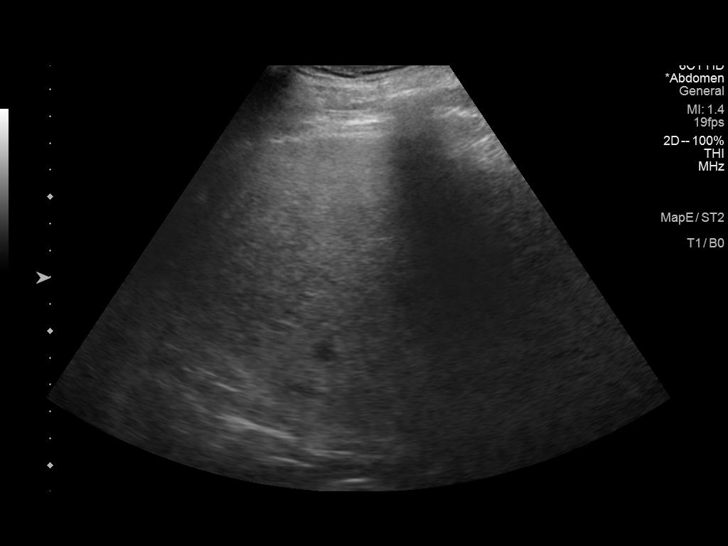
[im 26/34]
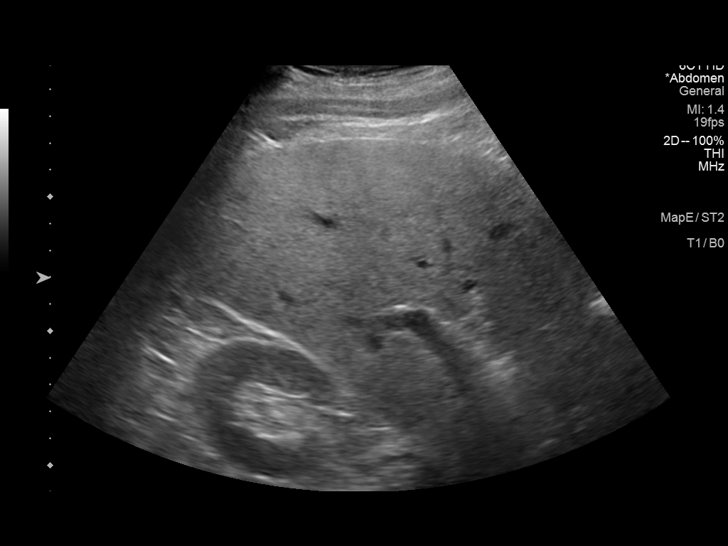
[im 29/34]
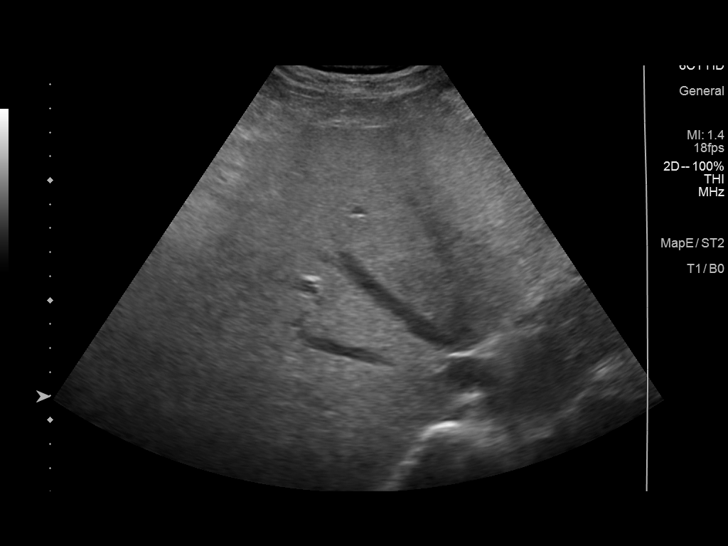
[im 32/34]
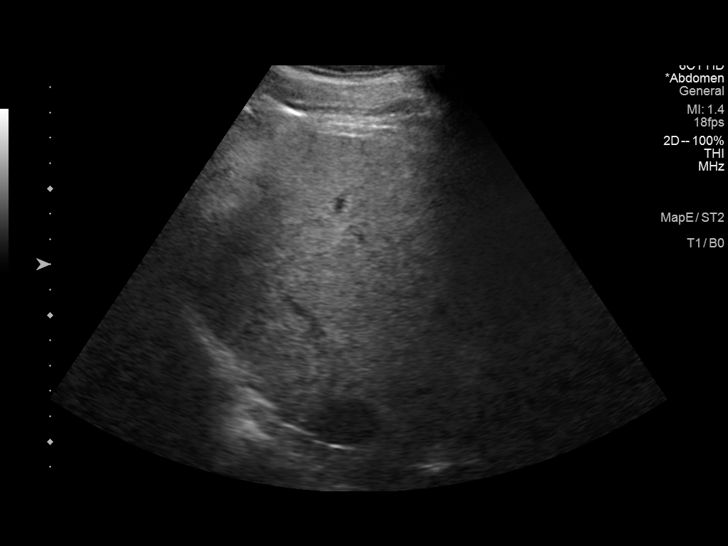

[Series 2001: us abdomen limited · 0.33mm/px · 1 of 2 slices shown (2 of 2)]
[im 1/2]
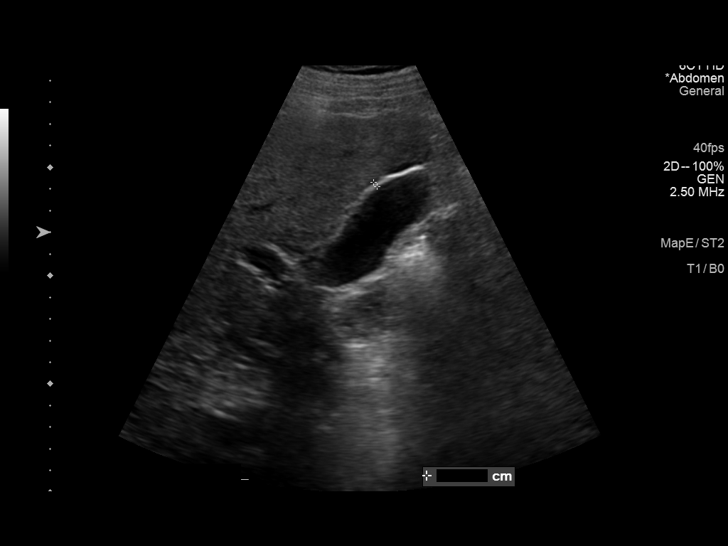

[14 of 25 positions shown; findings below may reference images not displayed]

FINDINGS: Gallbladder:

Normal appearance of the gallbladder. No gallstones, gallbladder
wall thickening, sonographic Murphy's sign, or pericholecystic fluid

Common bile duct:

Diameter: 4 mm

Liver:

Diffusely increased echogenicity of the liver compatible with
hepatic steatosis. No focal liver lesion. Portal vein is patent on
color Doppler imaging with normal direction of blood flow towards
the liver.

Other: None.
IMPRESSION: 1. No acute findings.
2. Echogenic liver compatible with hepatic steatosis.

## 2020-12-02 DIAGNOSIS — E785 Hyperlipidemia, unspecified: Secondary | ICD-10-CM | POA: Diagnosis not present

## 2020-12-02 DIAGNOSIS — I10 Essential (primary) hypertension: Secondary | ICD-10-CM | POA: Diagnosis not present

## 2020-12-02 DIAGNOSIS — E1142 Type 2 diabetes mellitus with diabetic polyneuropathy: Secondary | ICD-10-CM | POA: Diagnosis not present

## 2020-12-02 DIAGNOSIS — D62 Acute posthemorrhagic anemia: Secondary | ICD-10-CM | POA: Diagnosis not present

## 2020-12-02 DIAGNOSIS — E039 Hypothyroidism, unspecified: Secondary | ICD-10-CM | POA: Diagnosis not present

## 2021-01-17 DIAGNOSIS — Z79899 Other long term (current) drug therapy: Secondary | ICD-10-CM | POA: Diagnosis not present

## 2021-01-17 DIAGNOSIS — Z794 Long term (current) use of insulin: Secondary | ICD-10-CM | POA: Diagnosis not present

## 2021-01-17 DIAGNOSIS — Z720 Tobacco use: Secondary | ICD-10-CM | POA: Diagnosis not present

## 2021-01-17 DIAGNOSIS — E1165 Type 2 diabetes mellitus with hyperglycemia: Secondary | ICD-10-CM | POA: Diagnosis not present

## 2021-01-17 DIAGNOSIS — Z7984 Long term (current) use of oral hypoglycemic drugs: Secondary | ICD-10-CM | POA: Diagnosis not present

## 2021-02-26 DIAGNOSIS — E11649 Type 2 diabetes mellitus with hypoglycemia without coma: Secondary | ICD-10-CM | POA: Diagnosis not present

## 2021-02-26 DIAGNOSIS — Z23 Encounter for immunization: Secondary | ICD-10-CM | POA: Diagnosis not present

## 2021-02-26 DIAGNOSIS — R61 Generalized hyperhidrosis: Secondary | ICD-10-CM | POA: Diagnosis not present

## 2021-03-05 DIAGNOSIS — I10 Essential (primary) hypertension: Secondary | ICD-10-CM | POA: Diagnosis not present

## 2021-03-05 DIAGNOSIS — E785 Hyperlipidemia, unspecified: Secondary | ICD-10-CM | POA: Diagnosis not present

## 2021-03-05 DIAGNOSIS — E039 Hypothyroidism, unspecified: Secondary | ICD-10-CM | POA: Diagnosis not present

## 2021-03-05 DIAGNOSIS — D62 Acute posthemorrhagic anemia: Secondary | ICD-10-CM | POA: Diagnosis not present

## 2021-03-05 DIAGNOSIS — E1142 Type 2 diabetes mellitus with diabetic polyneuropathy: Secondary | ICD-10-CM | POA: Diagnosis not present

## 2021-03-21 DIAGNOSIS — J208 Acute bronchitis due to other specified organisms: Secondary | ICD-10-CM | POA: Diagnosis not present

## 2021-03-21 DIAGNOSIS — B9689 Other specified bacterial agents as the cause of diseases classified elsewhere: Secondary | ICD-10-CM | POA: Diagnosis not present

## 2021-12-26 ENCOUNTER — Other Ambulatory Visit: Payer: Self-pay | Admitting: Hematology and Oncology

## 2021-12-26 DIAGNOSIS — D582 Other hemoglobinopathies: Secondary | ICD-10-CM

## 2021-12-27 ENCOUNTER — Other Ambulatory Visit: Payer: Self-pay | Admitting: Hematology and Oncology

## 2021-12-27 ENCOUNTER — Inpatient Hospital Stay: Payer: Medicare Other | Attending: Hematology and Oncology | Admitting: Hematology and Oncology

## 2021-12-27 ENCOUNTER — Encounter: Payer: Self-pay | Admitting: Hematology and Oncology

## 2021-12-27 ENCOUNTER — Inpatient Hospital Stay: Payer: Medicare Other

## 2021-12-27 VITALS — BP 127/69 | HR 63 | Temp 98.1°F | Resp 20 | Ht 69.8 in | Wt 227.9 lb

## 2021-12-27 DIAGNOSIS — F17210 Nicotine dependence, cigarettes, uncomplicated: Secondary | ICD-10-CM | POA: Insufficient documentation

## 2021-12-27 DIAGNOSIS — Z79899 Other long term (current) drug therapy: Secondary | ICD-10-CM | POA: Insufficient documentation

## 2021-12-27 DIAGNOSIS — Z8546 Personal history of malignant neoplasm of prostate: Secondary | ICD-10-CM | POA: Insufficient documentation

## 2021-12-27 DIAGNOSIS — Z720 Tobacco use: Secondary | ICD-10-CM

## 2021-12-27 DIAGNOSIS — Z7984 Long term (current) use of oral hypoglycemic drugs: Secondary | ICD-10-CM | POA: Insufficient documentation

## 2021-12-27 DIAGNOSIS — D751 Secondary polycythemia: Secondary | ICD-10-CM | POA: Diagnosis present

## 2021-12-27 DIAGNOSIS — G4730 Sleep apnea, unspecified: Secondary | ICD-10-CM | POA: Insufficient documentation

## 2021-12-27 DIAGNOSIS — Z794 Long term (current) use of insulin: Secondary | ICD-10-CM | POA: Insufficient documentation

## 2021-12-27 DIAGNOSIS — E039 Hypothyroidism, unspecified: Secondary | ICD-10-CM | POA: Diagnosis not present

## 2021-12-27 DIAGNOSIS — D582 Other hemoglobinopathies: Secondary | ICD-10-CM | POA: Diagnosis not present

## 2021-12-27 DIAGNOSIS — D696 Thrombocytopenia, unspecified: Secondary | ICD-10-CM | POA: Insufficient documentation

## 2021-12-27 LAB — BASIC METABOLIC PANEL
BUN: 18 (ref 4–21)
CO2: 24 — AB (ref 13–22)
Chloride: 105 (ref 99–108)
Creatinine: 0.9 (ref 0.6–1.3)
Glucose: 155
Potassium: 4.4 mEq/L (ref 3.5–5.1)
Sodium: 142 (ref 137–147)

## 2021-12-27 LAB — CBC
MCV: 86 (ref 80–94)
RBC: 6.34 — AB (ref 3.87–5.11)

## 2021-12-27 LAB — CBC AND DIFFERENTIAL
HCT: 55 — AB (ref 41–53)
Hemoglobin: 18.3 — AB (ref 13.5–17.5)
Neutrophils Absolute: 4.6
Platelets: 124 10*3/uL — AB (ref 150–400)
WBC: 7.3

## 2021-12-27 LAB — HEPATIC FUNCTION PANEL
ALT: 47 U/L — AB (ref 10–40)
AST: 41 — AB (ref 14–40)
Alkaline Phosphatase: 68 (ref 25–125)
Bilirubin, Total: 0.7

## 2021-12-27 LAB — COMPREHENSIVE METABOLIC PANEL
Albumin: 4.5 (ref 3.5–5.0)
Calcium: 8.9 (ref 8.7–10.7)

## 2021-12-27 NOTE — Progress Notes (Unsigned)
Per [facility] MCR web-page:  CPT [id number] JAK 2  Notification or Prior Authorization is not required for the requested services  Decision ID #:[id number]  The number above acknowledges your inquiry and our response. Please write this number down and refer to it for future inquiries. Coverage and payment for an item or service is governed by the member's benefit plan document, and, if applicable, the provider's participation agreement with the Health Plan.

## 2021-12-27 NOTE — Progress Notes (Cosign Needed)
[facility]  [address] [place],  [place]  [id number] [phone]  Clinic Day:  12/27/2021  Referring physician: Nicoletta Dress, MD   REASON FOR CONSULTATION:  Elevated hemoglobin  HISTORY OF PRESENT ILLNESS:  Benjamin Aguilar is a 71 y.o. male with a history of elevated hemoglobin dating back to October 2022.  He is referred in consultation by Dr. Nelda Bucks for assessment and management.  On July 25, his hemoglobin was 18.1 and hematocrit 54.3 with an MCV of 87, white blood cell count 6.9 with a normal differential and platelets 103,000.  A CMP was normal except for sodium 146 and glucose 150.  TIBC, serum iron and iron saturation were normal.  TSH was normal.  On April 29th, his hemoglobin was 17.5, hematocrit 50.5 and platelets 101,000.  His PSA was less than 0.1.  In January, his hemoglobin was 17.8, hematocrit 51.1, and platelets 93,000.  In October 2022 his hemoglobin was 18.2, hematocrit 52.9 and platelets 135,000.   He reports fatigue, but also difficulty staying asleep at night.  He reports generalized pruritus.  He denies headaches.  He denies fevers, chills, or night sweats.  He has peripheral neuropathy due to diabetes.  He reports a history of sleep apnea, which she feels resolved when he lost weight he is a smoker and has smoked 1 to 2 packs for 30 years.  He has tried to quit using Chantix, but has not tried nicotine replacement.  He reports a smoker's cough productive of dark sputum, as well as intermittent shortness of breath.  He denies chest pain.  He denies unintentional weight loss.  He denies testosterone use.  He is a recovering alcoholic but has not had any alcohol in 25 years.  He has a history of hepatitis C and cirrhosis.  He had been following with hepatology, but states he has not seen them since COVID.  In addition to above he has a history of prostate cancer treated with seed implant in 2008.  He has hypertension,  hyperlipidemia, diabetes, anxiety and depression, GERD controlled with medication, hypothyroidism, osteoarthritis, Crohn's disease and testicular hypofunction.  He is status post left total knee replacement.  He underwent an EGD with Dr. Lyndel Safe in May 2018 which revealed mild portal hypertensive gastropathy with no esophageal or fundal varices.  EGD in 2 years was recommended.  He states his last colonoscopy was about 5 years ago with removal of polyps.  His brother, who is 35,  also has prostate cancer and is currently undergoing radiation.  His mother had non-Hodgkin's lymphoma.  His father had lung cancer that metastasized to his brain.  REVIEW OF SYSTEMS:  Review of Systems  Constitutional:  Positive for fatigue. Negative for appetite change, chills, fever and unexpected weight change.  HENT:   Negative for lump/mass, mouth sores and sore throat.   Respiratory:  Positive for cough and shortness of breath.   Cardiovascular:  Negative for chest pain and leg swelling.  Gastrointestinal:  Negative for abdominal pain, constipation, diarrhea, nausea and vomiting.  Genitourinary:  Positive for nocturia. Negative for difficulty urinating, dysuria, frequency and hematuria.   Musculoskeletal:  Negative for arthralgias, back pain and myalgias.  Skin:  Positive for itching. Negative for rash and wound.  Neurological:  Negative for dizziness, extremity weakness, headaches, light-headedness and numbness.  Hematological:  Negative for adenopathy.  Psychiatric/Behavioral:  Positive for depression and sleep disturbance. The patient is nervous/anxious.      VITALS:  Blood  pressure 127/69, pulse 63, temperature 98.1 F (36.7 C), temperature source Oral, resp. rate 20, height 5' 9.8" (1.773 m), weight 227 lb 14.4 oz (103.4 kg), SpO2 90 %.  Wt Readings from Last 3 Encounters:  12/27/21 227 lb 14.4 oz (103.4 kg)  05/06/16 234 lb (106.1 kg)  04/30/16 234 lb (106.1 kg)    Body mass index is 32.89  kg/m.  Performance status (ECOG): 1 - Symptomatic but completely ambulatory  PHYSICAL EXAM:  Physical Exam Vitals and nursing note reviewed.  Constitutional:      General: He is not in acute distress.    Appearance: Normal appearance. He is normal weight.  HENT:     Head: Normocephalic and atraumatic.     Mouth/Throat:     Mouth: Mucous membranes are moist.     Pharynx: Oropharynx is clear. No oropharyngeal exudate or posterior oropharyngeal erythema.  Eyes:     General: No scleral icterus.    Extraocular Movements: Extraocular movements intact.     Conjunctiva/sclera: Conjunctivae normal.     Pupils: Pupils are equal, round, and reactive to light.  Cardiovascular:     Rate and Rhythm: Normal rate and regular rhythm.     Heart sounds: Normal heart sounds. No murmur heard.    No friction rub. No gallop.  Pulmonary:     Effort: Pulmonary effort is normal.     Breath sounds: Normal breath sounds. No wheezing, rhonchi or rales.  Abdominal:     General: Bowel sounds are normal. There is no distension.     Palpations: Abdomen is soft. There is no hepatomegaly, splenomegaly or mass.     Tenderness: There is no abdominal tenderness.  Musculoskeletal:        General: Normal range of motion.     Cervical back: Normal range of motion and neck supple. No tenderness.     Right lower leg: No edema.     Left lower leg: No edema.  Lymphadenopathy:     Cervical: No cervical adenopathy.     Upper Body:     Right upper body: No supraclavicular or axillary adenopathy.     Left upper body: No supraclavicular or axillary adenopathy.     Lower Body: No right inguinal adenopathy. No left inguinal adenopathy.  Skin:    General: Skin is warm and dry.     Coloration: Skin is not jaundiced.     Findings: No rash.  Neurological:     Mental Status: He is alert and oriented to person, place, and time.     Cranial Nerves: No cranial nerve deficit.  Psychiatric:        Mood and Affect: Mood normal.         Behavior: Behavior normal.        Thought Content: Thought content normal.    LABS:      Latest Ref Rng & Units 12/27/2021   12:00 AM 05/10/2016    7:58 PM 05/08/2016    4:38 AM  CBC  WBC  7.3     7.5  9.2   Hemoglobin 13.5 - 17.5 18.3     11.9  13.1   Hematocrit 41 - 53 55     35.8  38.6   Platelets 150 - 400 K/uL 124     140  112      This result is from an external source.      Latest Ref Rng & Units 12/27/2021   12:00 AM 05/10/2016    7:58  PM 05/08/2016    4:38 AM  CMP  Glucose 65 - 99 mg/dL  51  101   BUN 4 - '21 18     25  22   ' Creatinine 0.6 - 1.3 0.9     1.13  1.18   Sodium 137 - 147 142     135  142   Potassium 3.5 - 5.1 mEq/L 4.4     4.3  4.5   Chloride 99 - 108 105     105  110   CO2 13 - '22 24     20  26   ' Calcium 8.7 - 10.7 8.9     8.6  8.7   Alkaline Phos 25 - 125 68        AST 14 - 40 41        ALT 10 - 40 U/L 47           This result is from an external source.     No results found for: "CEA1", "CEA" / No results found for: "CEA1", "CEA" No results found for: "PSA1" No results found for: "TWS568" No results found for: "CAN125"  No results found for: "TOTALPROTELP", "ALBUMINELP", "A1GS", "A2GS", "BETS", "BETA2SER", "GAMS", "MSPIKE", "SPEI" No results found for: "TIBC", "FERRITIN", "IRONPCTSAT" No results found for: "LDH"  STUDIES:  No results found.    HISTORY:   Past Medical History:  Diagnosis Date   Anemia    Anxiety    Arthritis    Cancer ([place])    hx of prostate cancer    Crohn's disease ([place])    Depression    Diabetes mellitus without complication ([place])    type II    ETOH abuse    hx quit  sober since 1999   GERD (gastroesophageal reflux disease)    Heart murmur    as a child    Hepatitis    hx of hep C - Harvoni therapy for 12 weeks    Hypertension    Hypothyroidism    Insomnia    Neuromuscular disorder ([place])    neuropathy    Peripheral neuropathy    Testicular hypofunction    Tremor     Past Surgical  History:  Procedure Laterality Date   HERNIA REPAIR     umbilical hernia repair    prostate seed implant      TOTAL KNEE ARTHROPLASTY Left 05/06/2016   Procedure: LEFT TOTAL KNEE ARTHROPLASTY;  Surgeon: Gaynelle Arabian, MD;  Location: [facility];  Service: Orthopedics;  Laterality: Left;  Adductor Block    Family History  Problem Relation Age of Onset   Heart failure Mother    Stroke Mother    Non-Hodgkin's lymphoma Mother    Hypertension Father    Brain cancer Father    Prostate cancer Brother     Social History:  reports that he has been smoking cigarettes. He has a 30.00 pack-year smoking history. He has never used smokeless tobacco. He reports that he does not currently use alcohol. He reports that he does not use drugs.The patient was born in [place] and raised in [place].  He is a retired Secretary/administrator.  He is divorced with 2 children.  He lives with his mother and is her caregiver.  He is alone today.  Allergies:  Allergies  Allergen Reactions   Ace Inhibitors    Alcohol Other (See Comments)    Pt is a recovering alcoholic -- prefers to not have any medication with  alcohol in it.     Current Medications: Current Outpatient Medications  Medication Sig Dispense Refill   Accu-Chek Softclix Lancets lancets Use to test blood sugar twice a day or as directed (Dx E11.65)     Blood Glucose Monitoring Suppl (ACCU-CHEK GUIDE) w/Device KIT See admin instructions.     insulin degludec (TRESIBA) 200 UNIT/ML FlexTouch Pen Inject into the skin.     metFORMIN (GLUCOPHAGE) 1000 MG tablet Take by mouth.     Semaglutide, 1 MG/DOSE, (OZEMPIC, 1 MG/DOSE,) 4 MG/3ML SOPN Inject 1 mg into the skin once a week.     ACCU-CHEK GUIDE test strip Use to check blood sugar twice a day or as directed.     amLODipine (NORVASC) 5 MG tablet Take 5 mg by mouth daily.     BD PEN NEEDLE NANO U/F 32G X 4 MM MISC SMARTSIG:injection Twice Daily     ezetimibe (ZETIA) 10 MG tablet Take 10 mg by mouth daily.      glipiZIDE (GLUCOTROL) 10 MG tablet Take 10 mg by mouth 2 (two) times daily before a meal.      hydrALAZINE (APRESOLINE) 25 MG tablet Take 25 mg by mouth 2 (two) times daily.     JARDIANCE 25 MG TABS tablet Take 25 mg by mouth daily.     LEVEMIR FLEXTOUCH 100 UNIT/ML Pen Inject 60 Units into the skin at bedtime.     levothyroxine (SYNTHROID, LEVOTHROID) 112 MCG tablet Take 112 mcg by mouth daily before breakfast.     lisinopril (PRINIVIL,ZESTRIL) 40 MG tablet Take 40 mg by mouth at bedtime.     Magnesium Oxide 400 MG CAPS Take by mouth.     meloxicam (MOBIC) 15 MG tablet Take 15 mg by mouth daily.     methocarbamol (ROBAXIN) 500 MG tablet Take 1 tablet (500 mg total) by mouth every 6 (six) hours as needed for muscle spasms. 80 tablet 0   metoprolol succinate (TOPROL-XL) 100 MG 24 hr tablet Take 100 mg by mouth daily.     omeprazole (PRILOSEC) 20 MG capsule Take 20 mg by mouth daily.     pregabalin (LYRICA) 300 MG capsule Take 300 mg by mouth 2 (two) times daily.     rosuvastatin (CRESTOR) 10 MG tablet Take 10 mg by mouth daily.     VASCEPA 1 g capsule Take 2 g by mouth 2 (two) times daily.     No current facility-administered medications for this visit.     ASSESSMENT & PLAN:   Assessment:   Benjamin Aguilar is a 71 y.o. 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  He also has mild chronic thrombocytopenia, which is most likely due to his liver disease.  He has not had regular GI or hepatology follow-up.  He is a current smoker with 30 to 50-pack-year history of smoking, so is eligible for low-dose CT chest for lung cancer screening.  Plan: 1.   A JAK2 mutation was done to evaluate for polycythemia vera.  2.  I recommended that he make an appointment with hepatology to reestablish care and undergo screening for hepatocellular carcinoma.  He will also need GI follow-up. 3.  I explained the reason for lung cancer screening cancer screening and counseled  him on the possibility of false positive examinations, over diagnosis, radiation exposure, as well as the potential for early stage diagnosis.  He is healthy enough to undergo necessary follow-up biopsies or surgery.  He understands the importance of complete  abstinence from tobacco.  As he did not have success with Chantix, I recommended nicotine replacement and weaning this down.  The patient wished to go ahead with low-dose CT chest.  I discussed the assessment and plan with the patient.  The patient was provided an opportunity to ask questions and all were answered.  The patient agreed with the plan and demonstrated an understanding of the instructions.  Will plan to see him back in 2 weeks to review the results.  Thank you for the referral.   I provided 60 minutes of face-to-face time during this encounter and > 50% was spent counseling as documented under my assessment and plan.    Marvia Pickles, PA-C

## 2021-12-28 ENCOUNTER — Other Ambulatory Visit: Payer: Self-pay

## 2021-12-28 DIAGNOSIS — D582 Other hemoglobinopathies: Secondary | ICD-10-CM

## 2021-12-28 DIAGNOSIS — D751 Secondary polycythemia: Secondary | ICD-10-CM | POA: Diagnosis not present

## 2021-12-28 LAB — FERRITIN: Ferritin: 42 ng/mL (ref 24–336)

## 2022-01-02 LAB — JAK2 GENOTYPR

## 2022-01-10 ENCOUNTER — Ambulatory Visit: Payer: Medicare Other | Admitting: Oncology

## 2022-01-10 NOTE — Progress Notes (Signed)
[facility]  [address] [place],  [place]  [address] [phone]  Clinic Day:  01/11/2022  Referring physician: Nicoletta Dress, MD  HISTORY OF PRESENT ILLNESS:  Benjamin Aguilar is a 71 y.o. 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  He comes in today to go over all of his recent labs to determine if there is an obvious etiology behind this.  Since his last visit, the patient has been doing well.  He admits to a heavy history of cigarette smoking.  He denies having headaches, vision changes, or other findings which concerning for potential hyperviscosity related symptoms from polycythemia.  PHYSICAL EXAM:  Blood pressure 119/63, pulse 66, temperature 98.6 F (37 C), resp. rate 16, height 5' 9.8" (1.773 m), weight 226 lb 11.2 oz (102.8 kg), SpO2 91 %.  Wt Readings from Last 3 Encounters:  01/11/22 226 lb 11.2 oz (102.8 kg)  12/27/21 227 lb 14.4 oz (103.4 kg)  05/06/16 234 lb (106.1 kg)    Body mass index is 32.71 kg/m.  Wt Readings from Last 3 Encounters:  01/11/22 226 lb 11.2 oz (102.8 kg)  12/27/21 227 lb 14.4 oz (103.4 kg)  05/06/16 234 lb (106.1 kg)   Performance status (ECOG): 1 - Symptomatic but completely ambulatory  Physical Exam Vitals and nursing note reviewed.  Constitutional:      General: He is not in acute distress.    Appearance: Normal appearance. He is normal weight.  HENT:     Head: Normocephalic and atraumatic.     Mouth/Throat:     Mouth: Mucous membranes are moist.     Pharynx: Oropharynx is clear. No oropharyngeal exudate or posterior oropharyngeal erythema.  Eyes:     General: No scleral icterus.    Extraocular Movements: Extraocular movements intact.     Conjunctiva/sclera: Conjunctivae normal.     Pupils: Pupils are equal, round, and reactive to light.  Cardiovascular:     Rate and Rhythm: Normal rate and regular rhythm.     Heart sounds: Normal heart sounds. No murmur heard.    No friction rub.  No gallop.  Pulmonary:     Effort: Pulmonary effort is normal.     Breath sounds: Normal breath sounds. No wheezing, rhonchi or rales.  Abdominal:     General: Bowel sounds are normal. There is no distension.     Palpations: Abdomen is soft. There is no hepatomegaly, splenomegaly or mass.     Tenderness: There is no abdominal tenderness.  Musculoskeletal:        General: Normal range of motion.     Cervical back: Normal range of motion and neck supple. No tenderness.     Right lower leg: No edema.     Left lower leg: No edema.  Lymphadenopathy:     Cervical: No cervical adenopathy.     Upper Body:     Right upper body: No supraclavicular or axillary adenopathy.     Left upper body: No supraclavicular or axillary adenopathy.     Lower Body: No right inguinal adenopathy. No left inguinal adenopathy.  Skin:    General: Skin is warm and dry.     Coloration: Skin is not jaundiced.     Findings: No rash.  Neurological:     Mental Status: He is alert and oriented to person, place, and time.     Cranial Nerves: No cranial nerve deficit.  Psychiatric:        Mood and Affect:  Mood normal.        Behavior: Behavior normal.        Thought Content: Thought content normal.    LABS:      Latest Ref Rng & Units 12/27/2021   12:00 AM 05/10/2016    7:58 PM 05/08/2016    4:38 AM  CBC  WBC  7.3     7.5  9.2   Hemoglobin 13.5 - 17.5 18.3     11.9  13.1   Hematocrit 41 - 53 55     35.8  38.6   Platelets 150 - 400 K/uL 124     140  112      This result is from an external source.      Latest Ref Rng & Units 12/27/2021   12:00 AM 05/10/2016    7:58 PM 05/08/2016    4:38 AM  CMP  Glucose 65 - 99 mg/dL  51  101   BUN 4 - '21 18     25  22   '$ Creatinine 0.6 - 1.3 0.9     1.13  1.18   Sodium 137 - 147 142     135  142   Potassium 3.5 - 5.1 mEq/L 4.4     4.3  4.5   Chloride 99 - 108 105     105  110   CO2 13 - '22 24     20  26   '$ Calcium 8.7 - 10.7 8.9     8.6  8.7   Alkaline Phos 25 - 125  68        AST 14 - 40 41        ALT 10 - 40 U/L 47           This result is from an external source.   JAK2 Genotype JAK2 genotypr Collected: 12/27/21 1312  Result status: Final  Resulting lab: [facility]  Value: Comment  Comment: (NOTE)  Result: NEGATIVE for the JAK2 V617F mutation.  Interpretation:  The G to T nucleotide change encoding the V617F  mutation was not detected.  This result does not rule out the  presence of the JAK2 mutation at a level below the sensitivity of  detection of this assay, or the presence of other mutations within  JAK2 not detected by this assay.  This result does not rule out a  diagnosis of polycythemia vera, essential thrombocythemia or  idiopathic myelofibrosis as the V617F mutation is not detected in all  patients with these disorders.     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  In clinic today, I went over all of his recent labs with him, which essentially rule out hemochromatosis or polycythemia rubra vera being present.  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  He understands complete abstinence from smoking can lead to his hematologic parameters normalizing over time.  Of note, he did have a low resolution CT scan for screening purposes, which did not show anything ominous for lung cancer.  However, this study will be repeated in 6 months to ensure no new pulmonary process has developed over time.  I will see him back in 6 months to reassess his secondary polycythemia, as well as go over his low resolution chest CT images.  The patient understands all the plans  discussed today and is in agreement with them.  [name], MD

## 2022-01-11 ENCOUNTER — Other Ambulatory Visit: Payer: Self-pay | Admitting: Oncology

## 2022-01-11 ENCOUNTER — Inpatient Hospital Stay: Payer: Medicare Other | Admitting: Oncology

## 2022-01-11 DIAGNOSIS — D751 Secondary polycythemia: Secondary | ICD-10-CM

## 2022-01-11 DIAGNOSIS — D582 Other hemoglobinopathies: Secondary | ICD-10-CM

## 2022-01-13 DIAGNOSIS — D751 Secondary polycythemia: Secondary | ICD-10-CM | POA: Insufficient documentation

## 2022-01-14 ENCOUNTER — Encounter: Payer: Self-pay | Admitting: Hematology and Oncology

## 2022-03-22 ENCOUNTER — Other Ambulatory Visit: Payer: Self-pay | Admitting: Genetic Counselor

## 2022-03-22 ENCOUNTER — Inpatient Hospital Stay: Payer: Medicare Other | Attending: Hematology and Oncology | Admitting: Genetic Counselor

## 2022-03-22 ENCOUNTER — Inpatient Hospital Stay: Payer: Medicare Other

## 2022-03-22 DIAGNOSIS — Z8546 Personal history of malignant neoplasm of prostate: Secondary | ICD-10-CM | POA: Diagnosis not present

## 2022-03-22 DIAGNOSIS — Z800 Family history of malignant neoplasm of digestive organs: Secondary | ICD-10-CM

## 2022-03-22 DIAGNOSIS — Z8042 Family history of malignant neoplasm of prostate: Secondary | ICD-10-CM | POA: Diagnosis not present

## 2022-03-22 NOTE — Progress Notes (Unsigned)
REFERRING PROVIDER: Nicoletta Dress, MD [address] [place],  [place] [address]  PRIMARY PROVIDER:  Nicoletta Dress, MD  PRIMARY REASON FOR VISIT:  No diagnosis found.   HISTORY OF PRESENT ILLNESS:   Benjamin Aguilar, a 71 y.o. male, was seen for a [facility] cancer genetics consultation at the request of Dr. Delena Bali due to a {Personal/family:20331} history of {cancer/polyps}.  Benjamin Aguilar presents to clinic today to discuss the possibility of a hereditary predisposition to cancer, to discuss genetic testing, and to further clarify his future cancer risks, as well as potential cancer risks for family members.    Prostate cancer diagnosed in 2008 (dx 56).  Seed Gleason 4.6      In ***, at the age of ***, Benjamin Aguilar was diagnosed with {CA PATHOLOGY:63853} of the {right left (wildcard):15202} {CA TFTDD:22025}. The treatment plan ***.    *** Benjamin Aguilar is a 71 y.o. male with no personal history of cancer.    CANCER HISTORY:  Oncology History   No history exists.     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[facility])    Depression    Diabetes mellitus without complication ([facility])    type II    ETOH abuse    hx quit  sober since 1999   GERD (gastroesophageal reflux disease)    Heart murmur    as a child    Hepatitis    hx of hep C - Harvoni therapy for 12 weeks    Hypertension    Hypothyroidism    Insomnia    Neuromuscular disorder ([facility])    neuropathy    Peripheral neuropathy    Testicular hypofunction    Tremor     Past Surgical History:  Procedure Laterality Date   HERNIA REPAIR     umbilical hernia repair    prostate seed implant      TOTAL KNEE ARTHROPLASTY Left 05/06/2016   Procedure: LEFT TOTAL KNEE ARTHROPLASTY;  Surgeon: Gaynelle Arabian, MD;   Location: [facility];  Service: Orthopedics;  Laterality: Left;  Adductor Block    Social History   Socioeconomic History   Marital status: Divorced    Spouse name: Not on file   Number of children: 2   Years of education: 13   Highest education level: Some college, no degree  Occupational History   Occupation: retired  Tobacco Use   Smoking status: Every Day    Packs/day: 1.00    Years: 30.00    Total pack years: 30.00    Types: Cigarettes    Last attempt to quit: 12/19/2015    Years since quitting: 6.2   Smokeless tobacco: Never  Vaping Use   Vaping Use: Never used  Substance and Sexual Activity   Alcohol use: Not Currently    Comment: hx ETOH abuse sober since 1999   Drug use: No    Comment: hx of marijuana in 1970s    Sexual activity: Not on file  Other Topics Concern   Not on file  Social History Narrative   Not on file   Social Determinants of Health   Financial Resource Strain: Not on file  Food Insecurity: Not on file  Transportation Needs: Not on file  Physical Activity: Not on file  Stress: Not on file  Social Connections: Not on file     FAMILY HISTORY:  We obtained a detailed, 4-generation family history.  Significant diagnoses are listed below: Family History  Problem Relation Age of Onset   Heart failure Mother    Stroke Mother    Non-Hodgkin'Aguilar lymphoma Mother    Hypertension Father    Brain cancer Father    Prostate cancer Brother     Mr. Rabenold is {aware/unaware} of previous family history of genetic testing for hereditary cancer risks. Patient'Aguilar maternal ancestors are of *** descent, and paternal ancestors are of *** descent. There {IS NO:12509} reported Ashkenazi Jewish ancestry. There {IS NO:12509} known consanguinity.  GENETIC COUNSELING ASSESSMENT: Benjamin Aguilar is a 71 y.o. male with a {Personal/family:20331} history of {cancer/polyps} which is somewhat suggestive of a {DISEASE} and predisposition to cancer given ***. We, therefore, discussed  and recommended the following at today'Aguilar visit.   DISCUSSION: We discussed that *** - ***% of *** is hereditary, with most cases of hereditary *** cancer associated with ***.  There are other genes that can be associated with hereditary *** cancer syndromes.  These include ***.  We discussed that testing is beneficial for several reasons, including knowing about other cancer risks, identifying potential screening and risk-reduction options that may be appropriate, and to understanding if other family members could be at risk for cancer and allowing them to undergo genetic testing.  We reviewed the characteristics, features and inheritance patterns of hereditary cancer syndromes. We also discussed genetic testing, including the appropriate family members to test, the process of testing, insurance coverage and turn-around-time for results. We discussed the implications of a negative, positive, carrier and/or variant of uncertain significant result. We discussed that negative results would be uninformative given that Benjamin Aguilar does not have a personal history of cancer. We recommended Benjamin Aguilar pursue genetic testing for a panel that contains genes associated with ***.  Benjamin Aguilar was offered a common hereditary cancer panel (48 genes) and an expanded pan-cancer panel (85 genes). Benjamin Aguilar was informed of the benefits and limitations of each panel, including that expanded pan-cancer panels contain several genes that do not have clear management guidelines at this point in time.  We also discussed that as the number of genes included on a panel increases, the chances of variants of uncertain significance increases.  After considering the benefits and limitations of each gene panel, Benjamin Aguilar elected to have an *** through ***.   Based on Benjamin Aguilar {Personal/family:20331} history of cancer, he meets medical criteria for genetic testing. Despite that he meets criteria, he may still have an out of  pocket cost. We discussed that if his out of pocket cost for testing is over $100, the laboratory should contact them to discuss self-pay options and/or patient pay assistance programs.   ***We reviewed the characteristics, features and inheritance patterns of hereditary cancer syndromes. We also discussed genetic testing, including the appropriate family members to test, the process of testing, insurance coverage and turn-around-time for results. We discussed the implications of a negative, positive and/or variant of uncertain significant result. In order to get genetic test results in a timely manner so that Benjamin Aguilar can use these genetic test results for surgical decisions, we recommended Benjamin Aguilar pursue genetic testing for the ***. Once complete, we recommend Benjamin Aguilar pursue reflex genetic testing to the *** gene panel.   Based on Benjamin Aguilar {Personal/family:20331} history of cancer, he  meets medical criteria for genetic testing. Despite that he meets criteria, he may still have an out of pocket cost.   ***We discussed with Benjamin Aguilar that the {Personal/family:20331} history does not meet insurance or NCCN criteria for genetic testing and, therefore, is not highly consistent with a familial hereditary cancer syndrome.  We feel he is at low risk to harbor a gene mutation associated with such a condition. Thus, we did not recommend any genetic testing, at this time, and recommended Benjamin Aguilar continue to follow the cancer screening guidelines given by his primary healthcare provider.  ***In order to estimate his chance of having a {CA GENE:62345} mutation, we used statistical models ({GENMODELS:62370}) that consider his personal medical history, family history and ancestry.  Because each model is different, there can be a lot of variability in the risks they give.  Therefore, these numbers must be considered a rough range and not a precise risk of having a {CA GENE:62345} mutation.  These  models estimate that she has approximately a ***-***% chance of having a mutation. Based on this assessment of her family and personal history, genetic testing {IS/ISNOT:34056} recommended.  ***Based on the patient'Aguilar {Personal/family:20331} history, a statistical model ({GENMODELS:62370}) was used to estimate his risk of developing {CA HX:54794}. This estimates his lifetime risk of developing {CA HX:54794} to be approximately ***%. This estimation does not consider any genetic testing results.  The patient'Aguilar lifetime breast cancer risk is a preliminary estimate based on available information using one of several models endorsed by the [place] (ACS). The ACS recommends consideration of breast MRI screening as an adjunct to mammography for patients at high risk (defined as 20% or greater lifetime risk).   ***Mr. Nazar has been determined to be at high risk for breast cancer.  Therefore, we recommend that annual screening with mammography and breast MRI be performed.  ***begin at age 7, or 10 years prior to the age of breast cancer diagnosis in a relative (whichever is earlier).  We discussed that Mr. Sweetser should discuss her individual situation with her referring physician and determine a breast cancer screening plan with which they are both comfortable.    We discussed that some people do not want to undergo genetic testing due to fear of genetic discrimination.  A federal law called the Genetic Information Non-Discrimination Act (GINA) of 2008 helps protect individuals against genetic discrimination based on their genetic test results.  It impacts both health insurance and employment.  With health insurance, it protects against increased premiums, being kicked off insurance or being forced to take a test in order to be insured.  For employment it protects against hiring, firing and promoting decisions based on genetic test results.  GINA does not apply to those in the [employer], those who  work for companies with less than 15 employees, and new life insurance or long-term disability insurance policies.  Health status due to a cancer diagnosis is not protected under GINA.  PLAN: After considering the risks, benefits, and limitations, Mr. Faircloth provided informed consent to pursue genetic testing and the blood sample was sent to {Lab} Laboratories for analysis of the {test}. Results should be available within approximately {TAT TIME} weeks' time, at which point they will be disclosed by telephone to Mr. Reust, as will any additional recommendations warranted by these results. Mr. Cauthorn will receive a summary of his genetic counseling visit and a copy of his results once available. This information will also be available in Epic.   ***  Despite our recommendation, Mr. Kurt did not wish to pursue genetic testing at today'Aguilar visit. We understand this decision and remain available to coordinate genetic testing at any time in the future. We, therefore, recommend Mr. Leeds continue to follow the cancer screening guidelines given by his primary healthcare provider.  ***Based on Mr. Hallum family history, we recommended his ***, who was diagnosed with *** at age ***, have genetic counseling and testing. Mr. Rudzinski will let us know if we can be of any assistance in coordinating genetic counseling and/or testing for this family member.   Lastly, we encouraged Mr. Luhman to remain in contact with cancer genetics annually so that we can continuously update the family history and inform him of any changes in cancer genetics and testing that may be of benefit for this family.   Mr. Gorin questions were answered to his satisfaction today. Our contact information was provided should additional questions or concerns arise. Thank you for the referral and allowing [place] to share in the care of your patient.   Saba Gomm M. Joette Catching, [place], [facility] Genetic Counselor [email or web address] (P)  [phone]   The patient was seen for a total of *** minutes in face-to-face genetic counseling.  ***The was patient was accompanied by ***.  ***The patient was seen alone.  Drs. Lindi Adie and/or Burr Medico were available to discuss this case as needed.  _______________________________________________________________________ For Office Staff:  Number of people involved in session: *** Was an Intern/ student involved with case: {YES/NO:63}

## 2022-03-25 ENCOUNTER — Encounter: Payer: Self-pay | Admitting: Genetic Counselor

## 2022-03-25 DIAGNOSIS — Z800 Family history of malignant neoplasm of digestive organs: Secondary | ICD-10-CM | POA: Insufficient documentation

## 2022-03-25 DIAGNOSIS — Z8546 Personal history of malignant neoplasm of prostate: Secondary | ICD-10-CM

## 2022-03-25 DIAGNOSIS — Z8042 Family history of malignant neoplasm of prostate: Secondary | ICD-10-CM | POA: Insufficient documentation

## 2022-04-15 ENCOUNTER — Encounter: Payer: Self-pay | Admitting: Genetic Counselor

## 2022-04-15 ENCOUNTER — Telehealth: Payer: Self-pay | Admitting: Genetic Counselor

## 2022-04-15 DIAGNOSIS — Z1379 Encounter for other screening for genetic and chromosomal anomalies: Secondary | ICD-10-CM | POA: Insufficient documentation

## 2022-04-15 NOTE — Telephone Encounter (Signed)
Contacted patient in attempt to disclose results of genetic testing.  LVM with contact information requesting a call back.  

## 2022-04-25 NOTE — Telephone Encounter (Signed)
Contacted patient in attempt to disclose results of genetic testing.  LVM with contact information requesting a call back.  Second attempt.  

## 2022-05-10 ENCOUNTER — Ambulatory Visit: Payer: Self-pay | Admitting: Genetic Counselor

## 2022-05-10 DIAGNOSIS — Z1379 Encounter for other screening for genetic and chromosomal anomalies: Secondary | ICD-10-CM

## 2022-05-10 DIAGNOSIS — Z800 Family history of malignant neoplasm of digestive organs: Secondary | ICD-10-CM

## 2022-05-10 DIAGNOSIS — Z8546 Personal history of malignant neoplasm of prostate: Secondary | ICD-10-CM

## 2022-05-10 DIAGNOSIS — Z8042 Family history of malignant neoplasm of prostate: Secondary | ICD-10-CM

## 2022-05-10 NOTE — Telephone Encounter (Signed)
Revealed negative genetics.   

## 2022-05-10 NOTE — Progress Notes (Signed)
HPI:   [name] was previously seen in the [facility] clinic due to a personal and family history of prostate cancer and concerns regarding a hereditary predisposition to cancer. Please refer to our prior cancer genetics clinic note for more information regarding our discussion, assessment and recommendations, at the time. [name] recent genetic test results were disclosed to him, as were recommendations warranted by these results. These results and recommendations are discussed in more detail below.  CANCER HISTORY:  In 2008, at the age of 71, 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       FAMILY HISTORY: We obtained a detailed, 4-generation family history.  Significant diagnoses are listed below:      Family History  Problem Relation Age of Onset   Non-Hodgkin's lymphoma Mother          dx 53s   Lung cancer Father 32        mets   Prostate cancer Brother 51   Colon cancer Maternal Grandfather          dx after 44         [name] is unaware of previous family history of genetic testing for hereditary cancer risks. There is no reported Ashkenazi Jewish ancestry. There is no known consanguinity.    GENETIC TEST RESULTS:  The [facility] Multi-Cancer +RNA Panel found no pathogenic mutations.   The Multi-Cancer + RNA Panel offered by [facility] includes sequencing and/or deletion/duplication analysis of the following 84 genes:  AIP*, ALK, APC*, ATM*, AXIN2*, BAP1*, BARD1*, BLM*, BMPR1A*, BRCA1*, BRCA2*, BRIP1*, CASR, CDC73*, CDH1*, CDK4, CDKN1B*, CDKN1C*, CDKN2A, CEBPA, CHEK2*, CTNNA1*, DICER1*, DIS3L2*, EGFR, EPCAM, FH*, FLCN*, GATA2*, GPC3, GREM1, HOXB13, HRAS, KIT, MAX*, MEN1*, MET, MITF, MLH1*, MSH2*, MSH3*, MSH6*, MUTYH*, NBN*, NF1*, NF2*, NTHL1*, PALB2*, PDGFRA, PHOX2B, PMS2*, POLD1*, POLE*, POT1*, PRKAR1A*, PTCH1*, PTEN*, RAD50*, RAD51C*, RAD51D*, RB1*, RECQL4, RET, RUNX1*, SDHA*, SDHAF2*, SDHB*,  SDHC*, SDHD*, SMAD4*, SMARCA4*, SMARCB1*, SMARCE1*, STK11*, SUFU*, TERC, TERT, TMEM127*, Tp53*, TSC1*, TSC2*, VHL*, WRN*, and WT1.  RNA analysis is performed for * genes.   The test report has been scanned into EPIC and is located under the Molecular Pathology section of the Results Review tab.  A portion of the result report is included below for reference. Genetic testing reported out on April 09, 2022.      Even though a pathogenic variant was not identified, possible explanations for the cancer in the family may include: There may be no hereditary risk for cancer in the family. The cancers in [name] and/or his family may be sporadic/familial or due to other genetic and environmental factors. There may be a gene mutation in one of these genes that current testing methods cannot detect but that chance is small. There could be another gene that has not yet been discovered, or that we have not yet tested, that is responsible for the cancer diagnoses in the family.  It is also possible there is a hereditary cause for the cancer in the family that [name] did not inherit.   Therefore, it is important to remain in touch with cancer genetics in the future so that we can continue to offer [name] the most up to date genetic testing.      ADDITIONAL GENETIC TESTING:  We discussed with [name] that his genetic testing was fairly extensive.  If there are additional relevant genes identified to increase cancer risk that can be analyzed in the future,  we would be happy to discuss and coordinate this testing at that time.     CANCER SCREENING RECOMMENDATIONS:  [name] test result is considered negative (normal).  This means that we have not identified a hereditary cause for his personal history of prostate cancer at this time.   An individual's cancer risk and medical management are not determined by genetic test results alone. Overall cancer risk assessment incorporates  additional factors, including personal medical history, family history, and any available genetic information that may result in a personalized plan for cancer prevention and surveillance. Therefore, it is recommended he continue to follow the cancer management and screening guidelines provided by his oncology and primary healthcare provider.  RECOMMENDATIONS FOR FAMILY MEMBERS:   Since he did not inherit a identifiable mutation in a cancer predisposition gene included on this panel, his children could not have inherited a known mutation from him in one of these genes. Individuals in this family might be at some increased risk of developing cancer, over the general population risk, due to the family history of cancer.  Individuals in the family should notify their providers of the family history of cancer. We recommend women in this family have a yearly mammogram beginning at age 64, or 5 years younger than the earliest onset of cancer, an annual clinical breast exam, and perform monthly breast self-exams.  Males in the family should speak with their providers about prostate cancer screening.    FOLLOW-UP:  Lastly, we discussed with [name] that cancer genetics is a rapidly advancing field and it is possible that new genetic tests will be appropriate for him and/or his family members in the future. We encouraged him to remain in contact with cancer genetics on an annual basis so we can update his personal and family histories and let him know of advances in cancer genetics that may benefit this family.   Our contact number was provided. [name] questions were answered to his satisfaction, and he knows he is welcome to call us at anytime with additional questions or concerns.   Benjamin Aguilar M. Joette Catching, [place], [facility] Genetic Counselor [email or web address] (P) [phone]

## 2022-07-14 NOTE — Progress Notes (Incomplete)
[facility]  [address] [place],  [place]  [address] [phone]  Clinic Day:  01/11/2022  Referring physician: Nicoletta Dress, MD  HISTORY OF PRESENT ILLNESS:  Benjamin Aguilar is a 72 y.o. 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  He comes in today to go over his low resolution CT scan and to reassess his hemoglobin.    PHYSICAL EXAM:  There were no vitals taken for this visit.  Wt Readings from Last 3 Encounters:  01/11/22 226 lb 11.2 oz (102.8 kg)  12/27/21 227 lb 14.4 oz (103.4 kg)  05/06/16 234 lb (106.1 kg)    There is no height or weight on file to calculate BMI.  Wt Readings from Last 3 Encounters:  01/11/22 226 lb 11.2 oz (102.8 kg)  12/27/21 227 lb 14.4 oz (103.4 kg)  05/06/16 234 lb (106.1 kg)   Performance status (ECOG): 1 - Symptomatic but completely ambulatory  Physical Exam Vitals and nursing note reviewed.  Constitutional:      General: He is not in acute distress.    Appearance: Normal appearance. He is normal weight.  HENT:     Head: Normocephalic and atraumatic.     Mouth/Throat:     Mouth: Mucous membranes are moist.     Pharynx: Oropharynx is clear. No oropharyngeal exudate or posterior oropharyngeal erythema.  Eyes:     General: No scleral icterus.    Extraocular Movements: Extraocular movements intact.     Conjunctiva/sclera: Conjunctivae normal.     Pupils: Pupils are equal, round, and reactive to light.  Cardiovascular:     Rate and Rhythm: Normal rate and regular rhythm.     Heart sounds: Normal heart sounds. No murmur heard.    No friction rub. No gallop.  Pulmonary:     Effort: Pulmonary effort is normal.     Breath sounds: Normal breath sounds. No wheezing, rhonchi or rales.  Abdominal:     General: Bowel sounds are normal. There is no distension.     Palpations: Abdomen is soft. There is no hepatomegaly, splenomegaly or mass.     Tenderness: There is no  abdominal tenderness.  Musculoskeletal:        General: Normal range of motion.     Cervical back: Normal range of motion and neck supple. No tenderness.     Right lower leg: No edema.     Left lower leg: No edema.  Lymphadenopathy:     Cervical: No cervical adenopathy.     Upper Body:     Right upper body: No supraclavicular or axillary adenopathy.     Left upper body: No supraclavicular or axillary adenopathy.     Lower Body: No right inguinal adenopathy. No left inguinal adenopathy.  Skin:    General: Skin is warm and dry.     Coloration: Skin is not jaundiced.     Findings: No rash.  Neurological:     Mental Status: He is alert and oriented to person, place, and time.     Cranial Nerves: No cranial nerve deficit.  Psychiatric:        Mood and Affect: Mood normal.        Behavior: Behavior normal.        Thought Content: Thought content normal.   LABS:      Latest Ref Rng & Units 12/27/2021   12:00 AM 05/10/2016    7:58 PM 05/08/2016    4:38 AM  CBC  WBC  7.3     7.5  9.2   Hemoglobin 13.5 - 17.5 18.3     11.9  13.1   Hematocrit 41 - 53 55     35.8  38.6   Platelets 150 - 400 K/uL 124     140  112      This result is from an external source.       Latest Ref Rng & Units 12/27/2021   12:00 AM 05/10/2016    7:58 PM 05/08/2016    4:38 AM  CMP  Glucose 65 - 99 mg/dL  51  101   BUN 4 - '21 18     25  22   '$ Creatinine 0.6 - 1.3 0.9     1.13  1.18   Sodium 137 - 147 142     135  142   Potassium 3.5 - 5.1 mEq/L 4.4     4.3  4.5   Chloride 99 - 108 105     105  110   CO2 13 - '22 24     20  26   '$ Calcium 8.7 - 10.7 8.9     8.6  8.7   Alkaline Phos 25 - 125 68        AST 14 - 40 41        ALT 10 - 40 U/L 47           This result is from an external source.    JAK2 Genotype JAK2 genotypr Collected: 12/27/21 1312  Result status: Final  Resulting lab: [facility]  Value: Comment  Comment: (NOTE)  Result: NEGATIVE for the JAK2 V617F mutation.   Interpretation:  The G to T nucleotide change encoding the V617F  mutation was not detected.  This result does not rule out the  presence of the JAK2 mutation at a level below the sensitivity of  detection of this assay, or the presence of other mutations within  JAK2 not detected by this assay.  This result does not rule out a  diagnosis of polycythemia vera, essential thrombocythemia or  idiopathic myelofibrosis as the V617F mutation is not detected in all  patients with these disorders.     Latest Reference Range & Units Most Recent  Ferritin 24 - 336 ng/mL 42 12/28/21 08:50   ASSESSMENT & PLAN:  A 72 y.o. male with polycythemia.  In clinic today, I went over all of his recent labs with him, which essentially rule out hemochromatosis or polycythemia rubra vera being present.  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  He understands complete abstinence from smoking can lead to his hematologic parameters normalizing over time.  Of note, he did have a low resolution CT scan for screening purposes, which did not show anything ominous for lung cancer.  However, this study will be repeated in 6 months to ensure no new pulmonary process has developed over time.  I will see him back in 6 months to reassess his secondary polycythemia, as well as go over his low resolution chest CT images.  The patient understands all the plans discussed today and is in agreement with them.  Jaivion Kingsley Macarthur Critchley, MD

## 2022-07-15 ENCOUNTER — Telehealth: Payer: Self-pay

## 2022-07-15 ENCOUNTER — Inpatient Hospital Stay: Payer: Medicare Other | Admitting: Oncology

## 2022-07-15 NOTE — Telephone Encounter (Unsigned)
Called report of CT lung screening, low dose. Findings are: multiple new pulmonary nodules measure up to 5.10m in the posterior segment of right upper lobe. Lung RADS 3, probably benign. Short term 6 month f/u recommended with low dose CT without contrast. They are faxing copy as well.   Dr LBobby Rumpfnotified of above. He has already seen the report.

## 2022-07-16 ENCOUNTER — Inpatient Hospital Stay: Payer: Medicare Other | Attending: Oncology | Admitting: Oncology

## 2022-07-16 ENCOUNTER — Inpatient Hospital Stay: Payer: Medicare Other

## 2022-07-16 ENCOUNTER — Other Ambulatory Visit: Payer: Self-pay | Admitting: Oncology

## 2022-07-16 DIAGNOSIS — F17210 Nicotine dependence, cigarettes, uncomplicated: Secondary | ICD-10-CM | POA: Insufficient documentation

## 2022-07-16 DIAGNOSIS — D751 Secondary polycythemia: Secondary | ICD-10-CM | POA: Insufficient documentation

## 2022-07-16 LAB — CBC WITH DIFFERENTIAL (CANCER CENTER ONLY)
Abs Immature Granulocytes: 0.02 10*3/uL (ref 0.00–0.07)
Basophils Absolute: 0 10*3/uL (ref 0.0–0.1)
Basophils Relative: 1 %
Eosinophils Absolute: 0.3 10*3/uL (ref 0.0–0.5)
Eosinophils Relative: 5 %
HCT: 53.7 % — ABNORMAL HIGH (ref 39.0–52.0)
Hemoglobin: 17.3 g/dL — ABNORMAL HIGH (ref 13.0–17.0)
Immature Granulocytes: 0 %
Lymphocytes Relative: 26 %
Lymphs Abs: 1.6 10*3/uL (ref 0.7–4.0)
MCH: 28.9 pg (ref 26.0–34.0)
MCHC: 32.2 g/dL (ref 30.0–36.0)
MCV: 89.6 fL (ref 80.0–100.0)
Monocytes Absolute: 0.5 10*3/uL (ref 0.1–1.0)
Monocytes Relative: 8 %
Neutro Abs: 3.7 10*3/uL (ref 1.7–7.7)
Neutrophils Relative %: 60 %
Platelet Count: 112 10*3/uL — ABNORMAL LOW (ref 150–400)
RBC: 5.99 MIL/uL — ABNORMAL HIGH (ref 4.22–5.81)
RDW: 14.6 % (ref 11.5–15.5)
WBC Count: 6.1 10*3/uL (ref 4.0–10.5)
nRBC: 0 % (ref 0.0–0.2)

## 2022-07-16 NOTE — Progress Notes (Unsigned)
[facility]  [address] [place],  [place]  [address] [phone]  Clinic Day:  01/11/2022  Referring physician: Nicoletta Dress, MD  HISTORY OF PRESENT ILLNESS:  Benjamin Aguilar is a 72 y.o. 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  He comes in today to go over all of his recent labs to determine if there is an obvious etiology behind this.  Since his last visit, the patient has been doing well.  He admits to a heavy history of cigarette smoking.  He denies having headaches, vision changes, or other findings which concerning for potential hyperviscosity related symptoms from polycythemia.  PHYSICAL EXAM:  There were no vitals taken for this visit.  Wt Readings from Last 3 Encounters:  01/11/22 226 lb 11.2 oz (102.8 kg)  12/27/21 227 lb 14.4 oz (103.4 kg)  05/06/16 234 lb (106.1 kg)    There is no height or weight on file to calculate BMI.  Wt Readings from Last 3 Encounters:  01/11/22 226 lb 11.2 oz (102.8 kg)  12/27/21 227 lb 14.4 oz (103.4 kg)  05/06/16 234 lb (106.1 kg)   Performance status (ECOG): 1 - Symptomatic but completely ambulatory  Physical Exam Vitals and nursing note reviewed.  Constitutional:      General: He is not in acute distress.    Appearance: Normal appearance. He is normal weight.  HENT:     Head: Normocephalic and atraumatic.     Mouth/Throat:     Mouth: Mucous membranes are moist.     Pharynx: Oropharynx is clear. No oropharyngeal exudate or posterior oropharyngeal erythema.  Eyes:     General: No scleral icterus.    Extraocular Movements: Extraocular movements intact.     Conjunctiva/sclera: Conjunctivae normal.     Pupils: Pupils are equal, round, and reactive to light.  Cardiovascular:     Rate and Rhythm: Normal rate and regular rhythm.     Heart sounds: Normal heart sounds. No murmur heard.    No friction rub. No gallop.  Pulmonary:     Effort: Pulmonary effort is normal.     Breath  sounds: Normal breath sounds. No wheezing, rhonchi or rales.  Abdominal:     General: Bowel sounds are normal. There is no distension.     Palpations: Abdomen is soft. There is no hepatomegaly, splenomegaly or mass.     Tenderness: There is no abdominal tenderness.  Musculoskeletal:        General: Normal range of motion.     Cervical back: Normal range of motion and neck supple. No tenderness.     Right lower leg: No edema.     Left lower leg: No edema.  Lymphadenopathy:     Cervical: No cervical adenopathy.     Upper Body:     Right upper body: No supraclavicular or axillary adenopathy.     Left upper body: No supraclavicular or axillary adenopathy.     Lower Body: No right inguinal adenopathy. No left inguinal adenopathy.  Skin:    General: Skin is warm and dry.     Coloration: Skin is not jaundiced.     Findings: No rash.  Neurological:     Mental Status: He is alert and oriented to person, place, and time.     Cranial Nerves: No cranial nerve deficit.  Psychiatric:        Mood and Affect: Mood normal.        Behavior: Behavior normal.  Thought Content: Thought content normal.    LABS:      Latest Ref Rng & Units 12/27/2021   12:00 AM 05/10/2016    7:58 PM 05/08/2016    4:38 AM  CBC  WBC  7.3     7.5  9.2   Hemoglobin 13.5 - 17.5 18.3     11.9  13.1   Hematocrit 41 - 53 55     35.8  38.6   Platelets 150 - 400 K/uL 124     140  112      This result is from an external source.       Latest Ref Rng & Units 12/27/2021   12:00 AM 05/10/2016    7:58 PM 05/08/2016    4:38 AM  CMP  Glucose 65 - 99 mg/dL  51  101   BUN 4 - '21 18     25  22   '$ Creatinine 0.6 - 1.3 0.9     1.13  1.18   Sodium 137 - 147 142     135  142   Potassium 3.5 - 5.1 mEq/L 4.4     4.3  4.5   Chloride 99 - 108 105     105  110   CO2 13 - '22 24     20  26   '$ Calcium 8.7 - 10.7 8.9     8.6  8.7   Alkaline Phos 25 - 125 68        AST 14 - 40 41        ALT 10 - 40 U/L 47           This  result is from an external source.    JAK2 Genotype JAK2 genotypr Collected: 12/27/21 1312  Result status: Final  Resulting lab: [facility]  Value: Comment  Comment: (NOTE)  Result: NEGATIVE for the JAK2 V617F mutation.  Interpretation:  The G to T nucleotide change encoding the V617F  mutation was not detected.  This result does not rule out the  presence of the JAK2 mutation at a level below the sensitivity of  detection of this assay, or the presence of other mutations within  JAK2 not detected by this assay.  This result does not rule out a  diagnosis of polycythemia vera, essential thrombocythemia or  idiopathic myelofibrosis as the V617F mutation is not detected in all  patients with these disorders.     Latest Reference Range & Units Most Recent  Ferritin 24 - 336 ng/mL 42 12/28/21 08:50   ASSESSMENT & PLAN:  A 72 y.o. male with polycythemia.  In clinic today, I went over all of his recent labs with him, which essentially rule out hemochromatosis or polycythemia rubra vera being present.  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  He understands complete abstinence from smoking can lead to his hematologic parameters normalizing over time.  Of note, he did have a low resolution CT scan for screening purposes, which did not show anything ominous for lung cancer.  However, this study will be repeated in 6 months to ensure no new pulmonary process has developed over time.  I will see him back in 6 months to reassess his secondary polycythemia, as well as go over his low resolution chest CT images.  The patient understands all the plans discussed today and is in agreement with them.  Aliegha Paullin Macarthur Critchley, MD

## 2022-07-23 ENCOUNTER — Encounter: Payer: Self-pay | Admitting: Oncology

## 2023-01-13 NOTE — Progress Notes (Signed)
[facility]  [address] 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  Of note, JAK2 mutation testing came back negative.  This gentleman has a long smoking history, which is felt to be the reason behind his polycythemia.  He comes in today to reassess his hemoglobin, as well as to review his most recent low resolution CT scan, which is being done for lung cancer screening.  Unfortunately, the patient continues to smoke on a daily basis. He denies having any particular changes in his health which concern him for complications related to his elevated blood counts or his smoking.  PHYSICAL EXAM:  Blood pressure (!) 145/67, pulse 62, temperature 98.7 F (37.1 C), resp. rate 20, height 5' 9.8" (1.773 m), weight 231 lb 3.2 oz (104.9 kg), SpO2 93%.  Wt Readings from Last 3 Encounters:  01/14/23 231 lb 3.2 oz (104.9 kg)  07/16/22 229 lb 1.6 oz (103.9 kg)  01/11/22 226 lb 11.2 oz (102.8 kg)    Body mass index is 33.36 kg/m.  Wt Readings from Last 3 Encounters:  01/11/22 226 lb 11.2 oz (102.8 kg)  12/27/21 227 lb 14.4 oz (103.4 kg)  05/06/16 234 lb (106.1 kg)   Performance status (ECOG): 1 - Symptomatic but completely ambulatory  Physical Exam Vitals and nursing note reviewed.  Constitutional:      General: He is not in acute distress.    Appearance: Normal appearance. He is normal weight.  HENT:     Head: Normocephalic and atraumatic.     Mouth/Throat:     Mouth: Mucous membranes are moist.     Pharynx: Oropharynx is clear. No oropharyngeal exudate or posterior oropharyngeal erythema.  Eyes:     General: No scleral icterus.    Extraocular Movements: Extraocular movements intact.     Conjunctiva/sclera: Conjunctivae normal.     Pupils: Pupils are equal, round, and reactive to light.   Cardiovascular:     Rate and Rhythm: Normal rate and regular rhythm.     Heart sounds: Normal heart sounds. No murmur heard.    No friction rub. No gallop.  Pulmonary:     Effort: Pulmonary effort is normal.     Breath sounds: Normal breath sounds. No wheezing, rhonchi or rales.  Abdominal:     General: Bowel sounds are normal. There is no distension.     Palpations: Abdomen is soft. There is no hepatomegaly, splenomegaly or mass.     Tenderness: There is no abdominal tenderness.  Musculoskeletal:        General: Normal range of motion.     Cervical back: Normal range of motion and neck supple. No tenderness.     Right lower leg: No edema.     Left lower leg: No edema.  Lymphadenopathy:     Cervical: No cervical adenopathy.     Upper Body:     Right upper body: No supraclavicular or axillary adenopathy.     Left upper body: No supraclavicular or axillary adenopathy.     Lower Body: No right inguinal adenopathy. No left inguinal adenopathy.  Skin:    General: Skin is warm and dry.     Coloration: Skin is not jaundiced.     Findings: No rash.  Neurological:     Mental Status: He is alert and oriented to person,  place, and time.     Cranial Nerves: No cranial nerve deficit.  Psychiatric:        Mood and Affect: Mood normal.        Behavior: Behavior normal.        Thought Content: Thought content normal.    LABS:      Latest Ref Rng & Units 01/14/2023   12:00 AM 07/16/2022    2:04 PM 12/27/2021   12:00 AM  CBC  WBC  6.6     6.1  7.3      Hemoglobin 13.5 - 17.5 18.0     17.3  18.3      Hematocrit 41 - 53 53     53.7  55      Platelets 150 - 400 K/uL 87     112  124         This result is from an external source.      Latest Ref Rng & Units 01/14/2023    1:27 PM 12/27/2021   12:00 AM 05/10/2016    7:58 PM  CMP  Glucose 70 - 99 mg/dL 098   51   BUN 8 - 23 mg/dL 20  18     25    Creatinine 0.61 - 1.24 mg/dL 1.19  0.9     1.47   Sodium 135 - 145 mmol/L 142  142     135    Potassium 3.5 - 5.1 mmol/L 4.1  4.4     4.3   Chloride 98 - 111 mmol/L 106  105     105   CO2 22 - 32 mmol/L 26  24     20    Calcium 8.9 - 10.3 mg/dL 9.2  8.9     8.6   Total Protein 6.5 - 8.1 g/dL 6.9     Total Bilirubin 0.3 - 1.2 mg/dL 0.6     Alkaline Phos 38 - 126 U/L 48  68       AST 15 - 41 U/L 29  41       ALT 0 - 44 U/L 34  47          This result is from an external source.   SCANS: His most recent low resolution chest CT revealed the following: FINDINGS: Cardiovascular: Heart size is normal. There is no significant pericardial fluid, thickening or pericardial calcification. There is aortic atherosclerosis, as well as atherosclerosis of the great vessels of the mediastinum and the coronary arteries, including calcified atherosclerotic plaque in the left main, left anterior descending, left circumflex and right coronary arteries. Dilatation of the pulmonic trunk (4.4 cm in diameter).  Mediastinum/Nodes: No pathologically enlarged mediastinal or hilar lymph nodes. Please note that accurate exclusion of hilar adenopathy is limited on noncontrast CT scans. Esophagus is unremarkable in appearance. No axillary lymphadenopathy.  Lungs/Pleura: Previously noted pulmonary nodules are generally stable or have resolved compared to the prior study, indicative of benign etiologies. The exception to this is a new pulmonary nodule in the anterior aspect of the right upper lobe (axial image 46) which has a volume derived mean diameter of 5.2 mm. No other larger more suspicious appearing pulmonary nodules or masses are noted. No acute consolidative airspace disease. No pleural effusions. Diffuse bronchial wall thickening with moderate centrilobular and paraseptal emphysema.  Upper Abdomen: Aortic atherosclerosis. Liver has a shrunken appearance and nodular contour, indicative of underlying cirrhosis.  Musculoskeletal: Healing fracture of the lateral aspect of the left eighth rib.  There are no aggressive appearing lytic or blastic lesions noted in the visualized portions of the skeleton.  IMPRESSION: 1. Lung-RADS 3S, probably benign findings. Short-term follow-up in 6 months is recommended with repeat low-dose chest CT without contrast (please use the following order, "CT CHEST LCS NODULE FOLLOW-UP W/O CM"). 2. The "S" modifier above refers to potentially clinically significant non lung cancer related findings. Specifically, there is aortic atherosclerosis, in addition to left main and three-vessel coronary artery disease. Please note that although the presence of coronary artery calcium documents the presence of coronary artery disease, the severity of this disease and any potential stenosis cannot be assessed on this non-gated CT examination. Assessment for potential risk factor modification, dietary therapy or pharmacologic therapy may be warranted, if clinically indicated. 3. Mild diffuse bronchial wall thickening with moderate centrilobular and paraseptal emphysema; imaging findings suggestive of underlying COPD. 4. Dilatation of the pulmonic trunk (4.4 cm in diameter), concerning for associated pulmonary arterial hypertension. 5. Cirrhosis.  Aortic Atherosclerosis (ICD10-I70.0) and Emphysema (ICD10-J43.9).  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  In clinic today, I went over his low resolution CT scan images with him, for which he could see the very small pulmonary nodules in question.  Overall, nothing is particularly enlarged to where some type of procedure can be done to ascertain their etiology.  Nevertheless, as he continues to smoke, he will continue to undergo screening CT scans.  As it pertains to his secondary polycythemia, his hemoglobin remains relatively stable.  I once again admonished him about his continued smoking; he knows a serious aerodigestive malignancy can develop if he continues to smoke.  Overall, the patient  appears to be doing fairly well.  I will see him back in 6 months to reassess his polycythemia.  I likely will repeat a low resolution chest CT in 1 year.  The patient understands all the plans discussed today and is in agreement with them.  Billy Rocco Kirby Funk, MD

## 2023-01-14 ENCOUNTER — Inpatient Hospital Stay: Payer: Medicare Other | Attending: Oncology | Admitting: Oncology

## 2023-01-14 ENCOUNTER — Other Ambulatory Visit: Payer: Self-pay | Admitting: Oncology

## 2023-01-14 ENCOUNTER — Inpatient Hospital Stay: Payer: Medicare Other

## 2023-01-14 VITALS — BP 145/67 | HR 62 | Temp 98.7°F | Resp 20 | Ht 69.8 in | Wt 231.2 lb

## 2023-01-14 DIAGNOSIS — D751 Secondary polycythemia: Secondary | ICD-10-CM

## 2023-01-14 DIAGNOSIS — F17200 Nicotine dependence, unspecified, uncomplicated: Secondary | ICD-10-CM | POA: Insufficient documentation

## 2023-01-14 LAB — CMP (CANCER CENTER ONLY)
ALT: 34 U/L (ref 0–44)
AST: 29 U/L (ref 15–41)
Albumin: 3.9 g/dL (ref 3.5–5.0)
Alkaline Phosphatase: 48 U/L (ref 38–126)
Anion gap: 10 (ref 5–15)
BUN: 20 mg/dL (ref 8–23)
CO2: 26 mmol/L (ref 22–32)
Calcium: 9.2 mg/dL (ref 8.9–10.3)
Chloride: 106 mmol/L (ref 98–111)
Creatinine: 0.98 mg/dL (ref 0.61–1.24)
GFR, Estimated: >60 mL/min (ref >60)
Glucose, Bld: 105 mg/dL — ABNORMAL HIGH (ref 70–99)
Potassium: 4.1 mmol/L (ref 3.5–5.1)
Sodium: 142 mmol/L (ref 135–145)
Total Bilirubin: 0.6 mg/dL (ref 0.3–1.2)
Total Protein: 6.9 g/dL (ref 6.5–8.1)

## 2023-01-14 LAB — CBC AND DIFFERENTIAL
HCT: 53 (ref 41–53)
Hemoglobin: 18 — AB (ref 13.5–17.5)
Neutrophils Absolute: 4.29
Platelets: 87 10*3/uL — AB (ref 150–400)
WBC: 6.6

## 2023-01-14 LAB — CBC: RBC: 6.22 — AB (ref 3.87–5.11)

## 2023-01-15 ENCOUNTER — Telehealth: Payer: Self-pay | Admitting: Oncology

## 2023-01-15 NOTE — Telephone Encounter (Signed)
Contacted pt to schedule an appt. Unable to reach via phone, voicemail was left.   Scheduling Message Entered by Rennis Harding A on 01/14/2023 at  5:44 PM Priority: Routine <No visit type provided>  Department: [facility]  Provider:  Scheduling Notes:  Labs/appt 07-17-23

## 2023-02-03 ENCOUNTER — Encounter: Payer: Self-pay | Admitting: Oncology

## 2023-02-10 ENCOUNTER — Encounter: Payer: Self-pay | Admitting: Oncology

## 2023-03-25 ENCOUNTER — Inpatient Hospital Stay (HOSPITAL_COMMUNITY): Payer: Medicare Other

## 2023-03-25 ENCOUNTER — Inpatient Hospital Stay (HOSPITAL_COMMUNITY)
Admission: EM | Admit: 2023-03-25 | Discharge: 2023-03-28 | DRG: 243 | Disposition: A | Discharge status: Home Health | Payer: Medicare Other | Attending: Cardiology | Admitting: Cardiology

## 2023-03-25 ENCOUNTER — Inpatient Hospital Stay (HOSPITAL_COMMUNITY)
Admission: EM | Disposition: A | Discharge status: Home Health | Payer: Self-pay | Source: Home / Self Care | Attending: Cardiology

## 2023-03-25 ENCOUNTER — Encounter (HOSPITAL_COMMUNITY): Payer: Self-pay

## 2023-03-25 ENCOUNTER — Other Ambulatory Visit: Payer: Self-pay

## 2023-03-25 DIAGNOSIS — N179 Acute kidney failure, unspecified: Secondary | ICD-10-CM | POA: Diagnosis present

## 2023-03-25 DIAGNOSIS — Z800 Family history of malignant neoplasm of digestive organs: Secondary | ICD-10-CM

## 2023-03-25 DIAGNOSIS — I442 Atrioventricular block, complete: Secondary | ICD-10-CM | POA: Diagnosis present

## 2023-03-25 DIAGNOSIS — Z8249 Family history of ischemic heart disease and other diseases of the circulatory system: Secondary | ICD-10-CM

## 2023-03-25 DIAGNOSIS — J9811 Atelectasis: Secondary | ICD-10-CM | POA: Diagnosis not present

## 2023-03-25 DIAGNOSIS — I4892 Unspecified atrial flutter: Secondary | ICD-10-CM | POA: Diagnosis not present

## 2023-03-25 DIAGNOSIS — D696 Thrombocytopenia, unspecified: Secondary | ICD-10-CM | POA: Diagnosis present

## 2023-03-25 DIAGNOSIS — Z7984 Long term (current) use of oral hypoglycemic drugs: Secondary | ICD-10-CM

## 2023-03-25 DIAGNOSIS — J449 Chronic obstructive pulmonary disease, unspecified: Secondary | ICD-10-CM | POA: Diagnosis present

## 2023-03-25 DIAGNOSIS — F32A Depression, unspecified: Secondary | ICD-10-CM | POA: Diagnosis present

## 2023-03-25 DIAGNOSIS — Z96652 Presence of left artificial knee joint: Secondary | ICD-10-CM | POA: Diagnosis present

## 2023-03-25 DIAGNOSIS — Z8042 Family history of malignant neoplasm of prostate: Secondary | ICD-10-CM

## 2023-03-25 DIAGNOSIS — K7460 Unspecified cirrhosis of liver: Secondary | ICD-10-CM | POA: Diagnosis present

## 2023-03-25 DIAGNOSIS — Y712 Prosthetic and other implants, materials and accessory cardiovascular devices associated with adverse incidents: Secondary | ICD-10-CM | POA: Diagnosis not present

## 2023-03-25 DIAGNOSIS — K219 Gastro-esophageal reflux disease without esophagitis: Secondary | ICD-10-CM | POA: Diagnosis present

## 2023-03-25 DIAGNOSIS — I10 Essential (primary) hypertension: Secondary | ICD-10-CM | POA: Diagnosis present

## 2023-03-25 DIAGNOSIS — Z794 Long term (current) use of insulin: Secondary | ICD-10-CM | POA: Diagnosis not present

## 2023-03-25 DIAGNOSIS — Z801 Family history of malignant neoplasm of trachea, bronchus and lung: Secondary | ICD-10-CM

## 2023-03-25 DIAGNOSIS — Z79890 Hormone replacement therapy (postmenopausal): Secondary | ICD-10-CM | POA: Diagnosis not present

## 2023-03-25 DIAGNOSIS — E039 Hypothyroidism, unspecified: Secondary | ICD-10-CM | POA: Diagnosis present

## 2023-03-25 DIAGNOSIS — R519 Headache, unspecified: Secondary | ICD-10-CM | POA: Diagnosis present

## 2023-03-25 DIAGNOSIS — R0902 Hypoxemia: Secondary | ICD-10-CM | POA: Diagnosis not present

## 2023-03-25 DIAGNOSIS — E782 Mixed hyperlipidemia: Secondary | ICD-10-CM | POA: Diagnosis present

## 2023-03-25 DIAGNOSIS — S0081XA Abrasion of other part of head, initial encounter: Secondary | ICD-10-CM | POA: Diagnosis present

## 2023-03-25 DIAGNOSIS — Z823 Family history of stroke: Secondary | ICD-10-CM

## 2023-03-25 DIAGNOSIS — Z79899 Other long term (current) drug therapy: Secondary | ICD-10-CM

## 2023-03-25 DIAGNOSIS — W1812XA Fall from or off toilet with subsequent striking against object, initial encounter: Secondary | ICD-10-CM | POA: Diagnosis present

## 2023-03-25 DIAGNOSIS — R7989 Other specified abnormal findings of blood chemistry: Secondary | ICD-10-CM | POA: Diagnosis present

## 2023-03-25 DIAGNOSIS — R001 Bradycardia, unspecified: Principal | ICD-10-CM

## 2023-03-25 DIAGNOSIS — Y9389 Activity, other specified: Secondary | ICD-10-CM

## 2023-03-25 DIAGNOSIS — Y92012 Bathroom of single-family (private) house as the place of occurrence of the external cause: Secondary | ICD-10-CM | POA: Diagnosis not present

## 2023-03-25 DIAGNOSIS — T82120A Displacement of cardiac electrode, initial encounter: Secondary | ICD-10-CM | POA: Diagnosis not present

## 2023-03-25 DIAGNOSIS — Z791 Long term (current) use of non-steroidal anti-inflammatories (NSAID): Secondary | ICD-10-CM

## 2023-03-25 DIAGNOSIS — R55 Syncope and collapse: Secondary | ICD-10-CM | POA: Diagnosis present

## 2023-03-25 DIAGNOSIS — E1142 Type 2 diabetes mellitus with diabetic polyneuropathy: Secondary | ICD-10-CM | POA: Diagnosis present

## 2023-03-25 DIAGNOSIS — Z7985 Long-term (current) use of injectable non-insulin antidiabetic drugs: Secondary | ICD-10-CM

## 2023-03-25 DIAGNOSIS — S0031XA Abrasion of nose, initial encounter: Secondary | ICD-10-CM | POA: Diagnosis present

## 2023-03-25 DIAGNOSIS — Z888 Allergy status to other drugs, medicaments and biological substances status: Secondary | ICD-10-CM

## 2023-03-25 DIAGNOSIS — Z8619 Personal history of other infectious and parasitic diseases: Secondary | ICD-10-CM

## 2023-03-25 DIAGNOSIS — Z8546 Personal history of malignant neoplasm of prostate: Secondary | ICD-10-CM

## 2023-03-25 DIAGNOSIS — Z807 Family history of other malignant neoplasms of lymphoid, hematopoietic and related tissues: Secondary | ICD-10-CM

## 2023-03-25 HISTORY — PX: TEMPORARY PACEMAKER: CATH118268

## 2023-03-25 LAB — BASIC METABOLIC PANEL
Anion gap: 15 (ref 5–15)
Anion gap: 11 (ref 5–15)
BUN: 29 mg/dL — ABNORMAL HIGH (ref 8–23)
BUN: 29 mg/dL — ABNORMAL HIGH (ref 8–23)
CO2: 23 mmol/L (ref 22–32)
CO2: 24 mmol/L (ref 22–32)
Calcium: 9.6 mg/dL (ref 8.9–10.3)
Calcium: 9.7 mg/dL (ref 8.9–10.3)
Chloride: 105 mmol/L (ref 98–111)
Chloride: 108 mmol/L (ref 98–111)
Creatinine, Ser: 1.39 mg/dL — ABNORMAL HIGH (ref 0.61–1.24)
Creatinine, Ser: 1.25 mg/dL — ABNORMAL HIGH (ref 0.61–1.24)
GFR, Estimated: 54 mL/min — ABNORMAL LOW (ref >60)
GFR, Estimated: >60 mL/min (ref >60)
Glucose, Bld: 155 mg/dL — ABNORMAL HIGH (ref 70–99)
Glucose, Bld: 85 mg/dL (ref 70–99)
Potassium: 4.9 mmol/L (ref 3.5–5.1)
Potassium: 5.2 mmol/L — ABNORMAL HIGH (ref 3.5–5.1)
Sodium: 143 mmol/L (ref 135–145)
Sodium: 143 mmol/L (ref 135–145)

## 2023-03-25 LAB — CBC WITH DIFFERENTIAL/PLATELET
Abs Immature Granulocytes: 0.1 10*3/uL — ABNORMAL HIGH (ref 0.00–0.07)
Basophils Absolute: 0 10*3/uL (ref 0.0–0.1)
Basophils Relative: 0 %
Eosinophils Absolute: 0.1 10*3/uL (ref 0.0–0.5)
Eosinophils Relative: 1 %
HCT: 57.8 % — ABNORMAL HIGH (ref 39.0–52.0)
Hemoglobin: 18.8 g/dL — ABNORMAL HIGH (ref 13.0–17.0)
Immature Granulocytes: 1 %
Lymphocytes Relative: 9 %
Lymphs Abs: 1.3 10*3/uL (ref 0.7–4.0)
MCH: 28.5 pg (ref 26.0–34.0)
MCHC: 32.5 g/dL (ref 30.0–36.0)
MCV: 87.6 fL (ref 80.0–100.0)
Monocytes Absolute: 0.9 10*3/uL (ref 0.1–1.0)
Monocytes Relative: 6 %
Neutro Abs: 12 10*3/uL — ABNORMAL HIGH (ref 1.7–7.7)
Neutrophils Relative %: 83 %
Platelets: 150 10*3/uL (ref 150–400)
RBC: 6.6 MIL/uL — ABNORMAL HIGH (ref 4.22–5.81)
RDW: 16.1 % — ABNORMAL HIGH (ref 11.5–15.5)
WBC: 14.3 10*3/uL — ABNORMAL HIGH (ref 4.0–10.5)
nRBC: 0 % (ref 0.0–0.2)

## 2023-03-25 LAB — MRSA NEXT GEN BY PCR, NASAL: MRSA by PCR Next Gen: NOT DETECTED

## 2023-03-25 LAB — TSH: TSH: 2.209 u[IU]/mL (ref 0.350–4.500)

## 2023-03-25 LAB — POCT I-STAT, CHEM 8
BUN: 40 mg/dL — ABNORMAL HIGH (ref 8–23)
Calcium, Ion: 1.09 mmol/L — ABNORMAL LOW (ref 1.15–1.40)
Chloride: 108 mmol/L (ref 98–111)
Creatinine, Ser: 1.4 mg/dL — ABNORMAL HIGH (ref 0.61–1.24)
Glucose, Bld: 153 mg/dL — ABNORMAL HIGH (ref 70–99)
HCT: 58 % — ABNORMAL HIGH (ref 39.0–52.0)
Hemoglobin: 19.7 g/dL — ABNORMAL HIGH (ref 13.0–17.0)
Potassium: 5 mmol/L (ref 3.5–5.1)
Sodium: 142 mmol/L (ref 135–145)
TCO2: 25 mmol/L (ref 22–32)

## 2023-03-25 LAB — GLUCOSE, CAPILLARY
Glucose-Capillary: 126 mg/dL — ABNORMAL HIGH (ref 70–99)
Glucose-Capillary: 87 mg/dL (ref 70–99)
Glucose-Capillary: 171 mg/dL — ABNORMAL HIGH (ref 70–99)

## 2023-03-25 LAB — ECHOCARDIOGRAM COMPLETE
AV Mean grad: 4 mm[Hg]
AV Peak grad: 6.9 mm[Hg]
Ao pk vel: 1.31 m/s
Area-P 1/2: 2.39 cm2
Height: 69 in
Weight: 3680 [oz_av]

## 2023-03-25 SURGERY — TEMPORARY PACEMAKER
Anesthesia: LOCAL

## 2023-03-25 MED ORDER — EPINEPHRINE HCL 5 MG/250ML IV SOLN IN NS: 0.5000 ug/min | INTRAVENOUS | Status: DC

## 2023-03-25 MED ORDER — ICOSAPENT ETHYL 1 G PO CAPS
2.0000 g | ORAL_CAPSULE | Freq: Two times a day (BID) | ORAL | Status: DC
  Administered 2023-03-25 – 2023-03-28 (×6): 2 g via ORAL
  Filled 2023-03-25 (×7): qty 2

## 2023-03-25 MED ORDER — EPINEPHRINE HCL 5 MG/250ML IV SOLN IN NS
INTRAVENOUS | Status: AC
  Administered 2023-03-25: 2 ug/min via INTRAVENOUS
  Filled 2023-03-25: qty 250

## 2023-03-25 MED ORDER — INSULIN ASPART 100 UNIT/ML IJ SOLN: 0.0000 [IU] | Freq: Three times a day (TID) | INTRAMUSCULAR | Status: DC

## 2023-03-25 MED ORDER — HEPARIN SODIUM (PORCINE) 5000 UNIT/ML IJ SOLN
5000.0000 [IU] | Freq: Three times a day (TID) | INTRAMUSCULAR | Status: DC
  Administered 2023-03-25 – 2023-03-26 (×3): 5000 [IU] via SUBCUTANEOUS
  Filled 2023-03-25 (×3): qty 1

## 2023-03-25 MED ORDER — LIDOCAINE HCL (PF) 1 % IJ SOLN
INTRAMUSCULAR | Status: DC | PRN
  Administered 2023-03-25: 10 mL

## 2023-03-25 MED ORDER — PHENYLEPHRINE 80 MCG/ML (10ML) SYRINGE FOR IV PUSH (FOR BLOOD PRESSURE SUPPORT)
PREFILLED_SYRINGE | INTRAVENOUS | Status: AC
  Filled 2023-03-25: qty 10

## 2023-03-25 MED ORDER — ACETAMINOPHEN 325 MG PO TABS
650.0000 mg | ORAL_TABLET | ORAL | Status: DC | PRN
  Administered 2023-03-27 (×2): 650 mg via ORAL
  Filled 2023-03-25 (×2): qty 2

## 2023-03-25 MED ORDER — MIDAZOLAM HCL 2 MG/2ML IJ SOLN: 1.0000 mg | Freq: Once | INTRAMUSCULAR | Status: DC

## 2023-03-25 MED ORDER — ONDANSETRON HCL 4 MG/2ML IJ SOLN: 4.0000 mg | Freq: Four times a day (QID) | INTRAMUSCULAR | Status: DC | PRN

## 2023-03-25 MED ORDER — ATROPINE SULFATE 1 MG/10ML IJ SOSY
1.0000 mg | PREFILLED_SYRINGE | Freq: Once | INTRAMUSCULAR | Status: AC
  Administered 2023-03-25: 1 mg via INTRAVENOUS

## 2023-03-25 MED ORDER — INSULIN ASPART 100 UNIT/ML IJ SOLN
0.0000 [IU] | Freq: Three times a day (TID) | INTRAMUSCULAR | Status: DC
  Administered 2023-03-26 – 2023-03-27 (×3): 1 [IU] via SUBCUTANEOUS
  Administered 2023-03-27: 2 [IU] via SUBCUTANEOUS
  Administered 2023-03-27: 3 [IU] via SUBCUTANEOUS
  Administered 2023-03-28: 1 [IU] via SUBCUTANEOUS
  Administered 2023-03-28: 2 [IU] via SUBCUTANEOUS

## 2023-03-25 MED ORDER — EZETIMIBE 10 MG PO TABS
10.0000 mg | ORAL_TABLET | Freq: Every day | ORAL | Status: DC
  Administered 2023-03-26 – 2023-03-28 (×3): 10 mg via ORAL
  Filled 2023-03-25 (×3): qty 1

## 2023-03-25 MED ORDER — PHENYLEPHRINE HCL-NACL 20-0.9 MG/250ML-% IV SOLN
INTRAVENOUS | Status: AC | PRN
  Administered 2023-03-25: 80 ug via INTRAVENOUS

## 2023-03-25 MED ORDER — ROSUVASTATIN CALCIUM 20 MG PO TABS
20.0000 mg | ORAL_TABLET | Freq: Every day | ORAL | Status: DC
  Administered 2023-03-25 – 2023-03-27 (×3): 20 mg via ORAL
  Filled 2023-03-25: qty 4
  Filled 2023-03-25 (×2): qty 1

## 2023-03-25 MED ORDER — SODIUM CHLORIDE 0.9 % IV SOLN
INTRAVENOUS | Status: AC | PRN
  Administered 2023-03-25 (×2): 10 mL/h via INTRAVENOUS

## 2023-03-25 SURGICAL SUPPLY — 6 items
CABLE ADAPT PACING TEMP 12FT (ADAPTER) IMPLANT
CATH S G BIP PACING (CATHETERS) IMPLANT
PACK CARDIAC CATHETERIZATION (CUSTOM PROCEDURE TRAY) IMPLANT
SHEATH PINNACLE 6F 10CM (SHEATH) IMPLANT
SHEATH PROBE COVER 6X72 (BAG) IMPLANT
SHIELD CATH-GARD CONTAMINATION (MISCELLANEOUS) IMPLANT

## 2023-03-25 NOTE — Code Documentation (Signed)
Doctor August Saucer at bedside now with respiratory.

## 2023-03-25 NOTE — Code Documentation (Signed)
Pt verbally responding. IV team looking for another line placement.

## 2023-03-25 NOTE — Progress Notes (Signed)
2330: Epi order placed, CXR placed 2332: 1mg  atropine ordered and given. Cardiac pads repositioned. Pt oriented and speech clear. 2334: Epi gtt at 59mcg/min. 2335: CXR at bedside. 2337: CXR read by Cards MD at bedside. 2338: Cards MD ordered pacing rate to 50. BP 77/58 (65) 2340: Pacing rate ordered to 80 by Cards MD. NS rapid bolus ordered. Epi gtt to 35mcg/min. 2342: CCM ground team at bedside. 2343: Pacing rate ordered to 80.

## 2023-03-25 NOTE — ED Provider Notes (Signed)
[place] EMERGENCY DEPARTMENT AT [facility] Provider Note   CSN: [id number] Arrival date & time: 03/25/23  1225     History  Chief Complaint  Patient presents with   Dizziness   Bradycardia    Benjamin Aguilar is a 72 y.o. male.  Patient is a 72 year old male who presents with dizziness and syncope.  He states he was feeling fine this morning and then went to have a bowel movement.  He felt dizzy and lightheaded.  He syncopized and fell forward hitting his head on the bathroom floor.  He did not have any chest pain or shortness of breath.  He initially called EMS and was feeling better and refused care.  EMS was called back again because he was feeling worse.  He was dizzy and diaphoretic.  EMS noted him to be hypotensive with undetectable blood pressures and heart rate in the 20s.  He was altered with some jerking/seizure-like activity.  They started pacing the patient and his blood pressure improved.  His mental status improved.  He is currently awake and alert.  He denies any known history of prior heart disease.  On chart review, he is on metoprolol.  Besides a headache, he denies any other complaints.  He is not anticoagulants.      Home Medications Prior to Admission medications   Medication Sig Start Date End Date Taking? Authorizing Provider  ACCU-CHEK GUIDE test strip Use to check blood sugar twice a day or as directed. 12/17/21   [provider]  Accu-Chek Softclix Lancets lancets Use to test blood sugar twice a day or as directed (Dx E11.65) 11/19/19   [provider]  amLODipine (NORVASC) 5 MG tablet Take 5 mg by mouth daily.    [provider]  BD PEN NEEDLE NANO U/F 32G X 4 MM MISC SMARTSIG:injection Twice Daily 12/14/21   [provider]  Blood Glucose Monitoring Suppl (ACCU-CHEK GUIDE) w/Device KIT See admin instructions. 11/19/19   [provider]  ezetimibe (ZETIA) 10 MG tablet Take 10 mg by mouth daily.     [provider]  glipiZIDE (GLUCOTROL) 10 MG tablet Take 10 mg by mouth 2 (two) times daily before a meal.     [provider]  hydrALAZINE (APRESOLINE) 25 MG tablet Take 25 mg by mouth 2 (two) times daily. 12/14/21   [provider]  insulin degludec (TRESIBA) 200 UNIT/ML FlexTouch Pen Inject into the skin. 10/02/21   [provider]  JARDIANCE 25 MG TABS tablet Take 25 mg by mouth daily. 12/14/21   [provider]  LEVEMIR FLEXTOUCH 100 UNIT/ML Pen Inject 60 Units into the skin at bedtime.    [provider]  levothyroxine (SYNTHROID, LEVOTHROID) 112 MCG tablet Take 112 mcg by mouth daily before breakfast.    [provider]  lisinopril (PRINIVIL,ZESTRIL) 40 MG tablet Take 40 mg by mouth at bedtime.    [provider]  Magnesium Oxide 400 MG CAPS Take by mouth.    [provider]  meloxicam (MOBIC) 15 MG tablet Take 15 mg by mouth daily. 12/07/21   [provider]  metFORMIN (GLUCOPHAGE) 1000 MG tablet Take by mouth. 10/02/21   [provider]  methocarbamol (ROBAXIN) 500 MG tablet Take 1 tablet (500 mg total) by mouth every 6 (six) hours as needed for muscle spasms. 05/07/16   Perkins, Alexzandrew L, PA-C  metoprolol succinate (TOPROL-XL) 100 MG 24 hr tablet Take 100 mg by mouth daily.  [provider]  omeprazole (PRILOSEC) 20 MG capsule Take 20 mg by mouth daily.    [provider]  pregabalin (LYRICA) 300 MG capsule Take 300 mg by mouth 2 (two) times daily.    [provider]  rosuvastatin (CRESTOR) 10 MG tablet Take 10 mg by mouth daily. 10/05/21   [provider]  Semaglutide, 1 MG/DOSE, (OZEMPIC, 1 MG/DOSE,) 4 MG/3ML SOPN Inject 1 mg into the skin once a week. 10/02/21   [provider]  VASCEPA 1 g capsule Take 2 g by mouth 2 (two) times daily. 11/30/21   [provider]      Allergies    Ace inhibitors and Alcohol    Review of Systems    Review of Systems  Constitutional:  Positive for diaphoresis and fatigue. Negative for chills and fever.  HENT:  Negative for congestion, rhinorrhea and sneezing.   Eyes: Negative.   Respiratory:  Negative for cough, chest tightness and shortness of breath.   Cardiovascular:  Negative for chest pain and leg swelling.  Gastrointestinal:  Positive for nausea. Negative for abdominal pain, blood in stool and diarrhea.  Genitourinary:  Negative for difficulty urinating, flank pain, frequency and hematuria.  Musculoskeletal:  Negative for arthralgias and back pain.  Skin:  Negative for rash.  Neurological:  Positive for dizziness, tremors, syncope and light-headedness. Negative for speech difficulty, weakness, numbness and headaches.    Physical Exam Updated Vital Signs BP (!) 134/26   Pulse (!) 44   Temp (!) 96.1 F (35.6 C) (Axillary)   Resp 12   Ht 5\' 9"  (1.753 m)   Wt 104.3 kg   SpO2 91%   BMI 33.97 kg/m  Physical Exam Constitutional:      Appearance: He is well-developed.  HENT:     Head: Normocephalic and atraumatic.  Eyes:     Pupils: Pupils are equal, round, and reactive to light.  Cardiovascular:     Comments: Patient currently being externally paced Pulmonary:     Effort: Pulmonary effort is normal. No respiratory distress.     Breath sounds: Normal breath sounds. No wheezing or rales.  Chest:     Chest wall: No tenderness.  Abdominal:     General: Bowel sounds are normal.     Palpations: Abdomen is soft.     Tenderness: There is no abdominal tenderness. There is no guarding or rebound.  Musculoskeletal:        General: Normal range of motion.     Cervical back: Normal range of motion and neck supple.  Lymphadenopathy:     Cervical: No cervical adenopathy.  Skin:    General: Skin is warm and dry.     Findings: No rash.  Neurological:     Mental Status: He is alert and oriented to person, place, and time.    ED Results / Procedures / Treatments    Labs (all labs ordered are listed, but only abnormal results are displayed) Labs Reviewed  POCT I-STAT, CHEM 8 - Abnormal; Notable for the following components:      Result Value   BUN 40 (*)    Creatinine, Ser 1.40 (*)    Glucose, Bld 153 (*)    Calcium, Ion 1.09 (*)    Hemoglobin 19.7 (*)    HCT 58.0 (*)    All other components within normal limits  BASIC METABOLIC PANEL  CBC WITH DIFFERENTIAL/PLATELET  I-STAT CHEM 8, ED    EKG EKG Interpretation Date/Time:  Tuesday March 25 2023 13:03:16 EST Ventricular Rate:  93 PR Interval:  189 QRS Duration:  172 QT Interval:  265 QTC Calculation: 194 R Axis:   211  Text Interpretation: Sinus or ectopic atrial bradycardia Paired ventricular premature complexes Nonspecific intraventricular conduction delay Minimal ST depression, inferior leads Confirmed by Rolan Bucco [phone]) on 03/25/2023 1:26:53 PM  Radiology No results found.  Procedures Procedures    Medications Ordered in ED Medications  midazolam (VERSED) injection 1 mg ( Intravenous MAR Hold 03/25/23 1317)  lidocaine (PF) (XYLOCAINE) 1 % injection (10 mLs Infiltration Given 03/25/23 1324)    ED Course/ Medical Decision Making/ A&P                                 Medical Decision Making Amount and/or Complexity of Data Reviewed Labs: ordered. Radiology: ordered.  Risk Prescription drug management. Decision regarding hospitalization.   Patient is a 72 year old who presents after syncopal episode.  I reviewed the EMS strips and it appeared that he was in third-degree heart block with heart rate in the 20s.  Currently he is being externally paced.  His blood pressure is stable.  Underlying rhythm on my interpretation appears to be bradycardia with a third-degree heart block with a heart rate in the 30s.  I contacted Dr. Jens Som with cardiology who came to see the patient.  He is emergently taken him to the Cath Lab for pacemaker placement.  Labs are pending.  CT  head and C-spine was ordered but in discussion with cardiology, Dr. Jens Som felt that the pacemaker placement was more urgent at this point and patient can get the scans following more emergent stabilization.  CRITICAL CARE Performed by: Rolan Bucco Total critical care time: 30 minutes Critical care time was exclusive of separately billable procedures and treating other patients. Critical care was necessary to treat or prevent imminent or life-threatening deterioration. Critical care was time spent personally by me on the following activities: development of treatment plan with patient and/or surrogate as well as nursing, discussions with consultants, evaluation of patient's response to treatment, examination of patient, obtaining history from patient or surrogate, ordering and performing treatments and interventions, ordering and review of laboratory studies, ordering and review of radiographic studies, pulse oximetry and re-evaluation of patient's condition.   Final Clinical Impression(s) / ED Diagnoses Final diagnoses:  Symptomatic bradycardia  Complete heart block [facility])    Rx / DC Orders ED Discharge Orders     None         Rolan Bucco, MD 03/25/23 1327

## 2023-03-25 NOTE — Code Documentation (Signed)
CPR started by RN upon assessment, TVP not pacing. SubQ pacing started

## 2023-03-25 NOTE — Progress Notes (Signed)
Ok to eat per Dr. Jens Som. Noted patient is diabetic, home med rec not yet completed. Added SSI/CBG checks. Requested pharm to perform admit med rec review and notify on call PA when completed. Signed out to KJ to be aware.

## 2023-03-25 NOTE — Plan of Care (Signed)
  Problem: Education: Goal: Knowledge of General Education information will improve Description: Including pain rating scale, medication(s)/side effects and non-pharmacologic comfort measures Outcome: Progressing   Problem: Health Behavior/Discharge Planning: Goal: Ability to manage health-related needs will improve Outcome: Progressing   Problem: Clinical Measurements: Goal: Respiratory complications will improve Outcome: Progressing   Problem: Activity: Goal: Risk for activity intolerance will decrease Outcome: Progressing   Problem: Nutrition: Goal: Adequate nutrition will be maintained Outcome: Progressing   Problem: Coping: Goal: Level of anxiety will decrease Outcome: Progressing   Problem: Elimination: Goal: Will not experience complications related to urinary retention Outcome: Progressing

## 2023-03-25 NOTE — Code Documentation (Signed)
CCM notified and monitored in.

## 2023-03-25 NOTE — Progress Notes (Addendum)
Per report to this RN, informed pt did not fall, however , pt states he fell and per Dr. Phoebe Sharps, + fall noted, pt noted to have about 1 inch reddish/pink mark above right eye brow and reddened area to top, bridge of nose, bilateral knees pinkened, safety maintained, remain OTA and no drainage presently

## 2023-03-25 NOTE — Code Documentation (Signed)
Cardiology MD at bedside. CRNA at bedside.

## 2023-03-25 NOTE — Code Documentation (Signed)
E 

## 2023-03-25 NOTE — ED Notes (Signed)
Do not give versed per cardiology at bedside

## 2023-03-25 NOTE — Progress Notes (Signed)
At 2319, RN was sitting outside patient's room and heard patient cough. RN glanced at monitor and patient's TVP lost capture. RN rushed to bedside, and patient still conscious. In the next moments, patient in and out of consciousness. RN called for help. Adjustment to TVP unsuccessful, patient lost consciousness, RN started CPR. Code called. See code narrator.

## 2023-03-25 NOTE — Progress Notes (Signed)
eLink Physician-Brief Progress Note Patient Name: Benjamin Aguilar DOB: 1951-04-29 MRN: [id number]   Date of Service  03/25/2023  HPI/Events of Note  72 y.o. male with diabetes mellitus, hypertension, hyperlipidemia, hypothyroidism, COPD, prostate cancer who is being seen 03/25/2023 for the evaluation of syncope/complete heart block   Patient suddenly lost pulses, transvenous pacer in place.  Protecting his airway for the time being bagged significant bradycardia into the 20s/30s.  eICU Interventions  Cardiology called to bedside  Epinephrine drip initiated, transcutaneous pacing in place.  No capture on TVP despite adjustments.  Attempted atropine 1 mg with no benefit.  Requested ground team evaluation in case of loss of airway.       Intervention Category Major Interventions: Arrhythmia - evaluation and management  [name] 03/25/2023, 11:35 PM

## 2023-03-25 NOTE — ED Triage Notes (Signed)
Pt BIB EMS from home due to dizziness and bradycardia. Pt vagel on toilet. Pt refused transport. EMS called again, pt unresponsive and seizure like activity. EMS states HR was wide complex at 22bpm. Pt arrives being paced at 70. BP 100 palp. No hx of seizures.

## 2023-03-25 NOTE — Clinical Note (Incomplete)
At 2319, RN was sitting outside patient's room, heard patient cough. RN glanced at monitor and patient's TVP lost capture. RN rushed to bedside, and patient still conscious, but in the next moments, patient in and out of consciousness. RN called for help and started CPR

## 2023-03-25 NOTE — H&P (Addendum)
Cardiology Admission History and Physical   Patient ID: Benjamin Aguilar MRN: [id number]; DOB: 01-15-1951   Admission date: 03/25/2023  PCP:  Paulina Fusi, MD   [facility] Providers Cardiologist:  New    Chief Complaint:  Syncope, CHB  Patient Profile:   Benjamin Aguilar is a 72 y.o. male with diabetes mellitus, hypertension, hyperlipidemia, hypothyroidism, COPD, prostate cancer who is being seen 03/25/2023 for the evaluation of syncope/complete heart block.  History of Present Illness:   Patient apparently has no prior cardiac history.  He is on Toprol 100 mg daily for blood pressure.  He typically does not have significant dyspnea on exertion, orthopnea, PND, pedal edema, exertional chest pain.  Today he felt well and went to the bathroom.  He then had a syncopal episode.  He states he was unconscious for approximately 15 minutes but he did hit his head.  Patient was brought to the emergency room and found to be in complete heart block with a heart rate of 20.  Cardiology now asked to evaluate.   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    Hypertension    Hypothyroidism    Insomnia    Neuromuscular disorder (HCC)    neuropathy    Peripheral neuropathy    Personal history of prostate cancer 03/25/2022   Testicular hypofunction    Tremor     Past Surgical History:  Procedure Laterality Date   HERNIA REPAIR     umbilical hernia repair    prostate seed implant      TOTAL KNEE ARTHROPLASTY Left 05/06/2016    Procedure: LEFT TOTAL KNEE ARTHROPLASTY;  Surgeon: Ollen Gross, MD;  Location: [facility];  Service: Orthopedics;  Laterality: Left;  Adductor Block     Medications Prior to Admission: Prior to Admission medications   Medication Sig Start Date End Date Taking? Authorizing Provider  ACCU-CHEK GUIDE test strip Use to check blood sugar twice a day or as directed. 12/17/21   [provider]  Accu-Chek Softclix Lancets lancets Use to test blood sugar twice a day or as directed (Dx E11.65) 11/19/19   [provider]  amLODipine (NORVASC) 5 MG tablet Take 5 mg by mouth daily.    [provider]  BD PEN NEEDLE NANO U/F 32G X 4 MM MISC SMARTSIG:injection Twice Daily 12/14/21   [provider]  Blood Glucose Monitoring Suppl (ACCU-CHEK GUIDE) w/Device KIT See admin instructions. 11/19/19   [provider]  ezetimibe (ZETIA) 10 MG tablet Take 10 mg by mouth daily.    [provider]  glipiZIDE (GLUCOTROL) 10 MG tablet Take 10 mg by mouth 2 (two) times daily before a meal.     [provider]  hydrALAZINE (APRESOLINE) 25 MG tablet Take 25 mg by mouth 2 (two) times daily. 12/14/21   [provider]  insulin  degludec (TRESIBA) 200 UNIT/ML FlexTouch Pen Inject into the skin. 10/02/21   [provider]  JARDIANCE 25 MG TABS tablet Take 25 mg by mouth daily. 12/14/21   [provider]  LEVEMIR FLEXTOUCH 100 UNIT/ML Pen Inject 60 Units into the skin at bedtime.    [provider]  levothyroxine (SYNTHROID, LEVOTHROID) 112 MCG tablet Take 112 mcg by mouth daily before breakfast.    [provider]  lisinopril (PRINIVIL,ZESTRIL) 40 MG tablet Take 40 mg by mouth at bedtime.    [provider]  Magnesium Oxide 400 MG CAPS Take by mouth.    [provider]  meloxicam (MOBIC) 15 MG tablet Take 15 mg by mouth daily. 12/07/21   [provider]  metFORMIN (GLUCOPHAGE) 1000 MG tablet Take by mouth.  10/02/21   [provider]  methocarbamol (ROBAXIN) 500 MG tablet Take 1 tablet (500 mg total) by mouth every 6 (six) hours as needed for muscle spasms. 05/07/16   Perkins, Alexzandrew L, PA-C  metoprolol succinate (TOPROL-XL) 100 MG 24 hr tablet Take 100 mg by mouth daily.    [provider]  omeprazole (PRILOSEC) 20 MG capsule Take 20 mg by mouth daily.    [provider]  pregabalin (LYRICA) 300 MG capsule Take 300 mg by mouth 2 (two) times daily.    [provider]  rosuvastatin (CRESTOR) 10 MG tablet Take 10 mg by mouth daily. 10/05/21   [provider]  Semaglutide, 1 MG/DOSE, (OZEMPIC, 1 MG/DOSE,) 4 MG/3ML SOPN Inject 1 mg into the skin once a week. 10/02/21   [provider]  VASCEPA 1 g capsule Take 2 g by mouth 2 (two) times daily. 11/30/21   [provider]     Allergies:    Allergies  Allergen Reactions   Ace Inhibitors    Alcohol Other (See Comments)    Pt is a recovering alcoholic -- prefers to not have any medication with alcohol in it.     Social History:   Social History   Socioeconomic History   Marital status: Divorced    Spouse name: Not on file   Number of children: 2   Years of education: 13   Highest education level: Some college, no degree  Occupational History   Occupation: retired  Tobacco Use   Smoking status: Every Day    Current packs/day: 0.00    Average packs/day: 1 pack/day for 30.0 years (30.0 ttl pk-yrs)    Types: Cigarettes    Start date: 12/18/1985    Last attempt to quit: 12/19/2015    Years since quitting: 7.2   Smokeless tobacco: Never  Vaping Use   Vaping status: Never Used  Substance and Sexual Activity   Alcohol use: Not Currently    Comment: hx ETOH abuse sober since 1999   Drug use: No    Comment: hx of marijuana in 1970s    Sexual activity: Not on file  Other Topics Concern   Not on file  Social History Narrative   Not on file   Social Determinants of Health    Financial Resource Strain: Not on file  Food Insecurity: Not on file  Transportation Needs: Not on file  Physical Activity: Not on file  Stress: Not on file  Social Connections: Not on file  Intimate Partner Violence: Not on file    Family History:   The patient's family history includes Colon cancer in his maternal grandfather; Heart failure in his mother; Hypertension in his father;  Lung cancer (age of onset: 67) in his father; Non-Hodgkin's lymphoma in his mother; Prostate cancer (age of onset: 92) in his brother; Stroke in his mother.    ROS:  Please see the history of present illness.  No fevers, chills or productive cough.  All other ROS reviewed and negative.     Physical Exam/Data:   Vitals:   03/25/23 1243 03/25/23 1245 03/25/23 1300 03/25/23 1305  BP:  (!) 148/133 (!) 127/10 (!) 134/26  Pulse:  78 (!) 25 (!) 44  Resp:  (!) 42 (!) 23 12  Temp: (!) 96.1 F (35.6 C)     TempSrc: Axillary     SpO2:  (!) 85% (!) 89% 91%  Weight:      Height:       No intake or output data in the 24 hours ending 03/25/23 1325    03/25/2023   12:37 PM 01/14/2023    1:30 PM 07/16/2022    2:50 PM  Last 3 Weights  Weight (lbs) 230 lb 231 lb 3.2 oz 229 lb 1.6 oz  Weight (kg) 104.327 kg 104.872 kg 103.919 kg     Body mass index is 33.97 kg/m.  General:  Well nourished, well developed, in mild distress HEENT: normal Neck: no JVD Vascular: No carotid bruits; Distal pulses 2+ bilaterally   Cardiac: Bradycardic, regular, no murmur noted. Lungs: Diminished breath sounds throughout. Abd: soft, nontender, no hepatomegaly  Ext: Trace edema Musculoskeletal:  No deformities, BUE and BLE strength normal and equal Skin: warm and dry  Neuro:  CNs 2-12 intact, no focal abnormalities noted Psych:  Normal affect    EKG:  The ECG that was done 03/25/23 was personally reviewed and demonstrates ventricular escape rhythm at 20 with underlying complete heart block  Laboratory  Data:  Chemistry Recent Labs  Lab 03/25/23 1319  NA 142  K 5.0  CL 108  GLUCOSE 153*  BUN 40*  CREATININE 1.40*     Hematology Recent Labs  Lab 03/25/23 1319  HGB 19.7*  HCT 58.0*     Assessment and Plan:   Complete heart block-patient was found to be bradycardic with complete heart block and ventricular escape rhythm with heart rate of 20.  He has had 2 recent syncopal episodes this morning.  At time of my assessment in the emergency room transcutaneous pacemaker was in place set at heart rate of 64.  However it was not capturing as his pulse was 20.  We therefore feel that emergent transvenous pacemaker is indicated and we will arrange.  He is on Toprol at home which will be discontinued.  Will follow his heart rate over the next 24 to 48 hours.  If his heart rate does not improve he will need permanent pacemaker.  Check echocardiogram for LV function. Syncope-due to bradycardia as outlined above.  Plan as outlined.  Note patient did strike his head but there are no obvious neurological abnormalities.  Will follow clinically and do head CT as indicated. Acute kidney injury-patient's BUN and creatinine 40 and 1.40.  Likely secondary to hypoperfusion.  Will follow. Hypertension-we have discontinuing metoprolol.  Hold hydralazine and lisinopril for now.  Follow blood pressure and will resume as needed. Diabetes mellitus-continue preadmission medications and follow CBGs. Hyperlipidemia-continue Crestor and Zetia.  Code Status: Full Code  Severity of Illness: The appropriate patient status for this patient is INPATIENT. Inpatient status is judged to be reasonable and necessary in order to provide the required intensity of service to ensure the  patient's safety. The patient's presenting symptoms, physical exam findings, and initial radiographic and laboratory data in the context of their chronic comorbidities is felt to place them at high risk for further clinical deterioration.  Furthermore, it is not anticipated that the patient will be medically stable for discharge from the hospital within 2 midnights of admission.   * I certify that at the point of admission it is my clinical judgment that the patient will require inpatient hospital care spanning beyond 2 midnights from the point of admission due to high intensity of service, high risk for further deterioration and high frequency of surveillance required.*   For questions or updates, please contact [facility] Please consult [email or web address] for contact info under     Signed, Olga Millers, MD  03/25/2023 1:25 PM

## 2023-03-26 ENCOUNTER — Encounter (HOSPITAL_COMMUNITY)
Admission: EM | Disposition: A | Discharge status: Home Health | Payer: Self-pay | Source: Home / Self Care | Attending: Cardiology

## 2023-03-26 ENCOUNTER — Encounter (HOSPITAL_COMMUNITY): Payer: Self-pay | Admitting: Cardiology

## 2023-03-26 DIAGNOSIS — I442 Atrioventricular block, complete: Secondary | ICD-10-CM | POA: Diagnosis not present

## 2023-03-26 HISTORY — PX: PACEMAKER IMPLANT: EP1218

## 2023-03-26 HISTORY — PX: TEMPORARY PACEMAKER: CATH118268

## 2023-03-26 LAB — CBC
HCT: 50.8 % (ref 39.0–52.0)
Hemoglobin: 16.8 g/dL (ref 13.0–17.0)
MCH: 28.7 pg (ref 26.0–34.0)
MCHC: 33.1 g/dL (ref 30.0–36.0)
MCV: 86.8 fL (ref 80.0–100.0)
Platelets: 82 10*3/uL — ABNORMAL LOW (ref 150–400)
RBC: 5.85 MIL/uL — ABNORMAL HIGH (ref 4.22–5.81)
RDW: 14.8 % (ref 11.5–15.5)
WBC: 8.5 10*3/uL (ref 4.0–10.5)
nRBC: 0 % (ref 0.0–0.2)

## 2023-03-26 LAB — GLUCOSE, CAPILLARY
Glucose-Capillary: 113 mg/dL — ABNORMAL HIGH (ref 70–99)
Glucose-Capillary: 100 mg/dL — ABNORMAL HIGH (ref 70–99)
Glucose-Capillary: 141 mg/dL — ABNORMAL HIGH (ref 70–99)

## 2023-03-26 LAB — BASIC METABOLIC PANEL
Anion gap: 10 (ref 5–15)
BUN: 29 mg/dL — ABNORMAL HIGH (ref 8–23)
CO2: 21 mmol/L — ABNORMAL LOW (ref 22–32)
Calcium: 8.7 mg/dL — ABNORMAL LOW (ref 8.9–10.3)
Chloride: 110 mmol/L (ref 98–111)
Creatinine, Ser: 1.07 mg/dL (ref 0.61–1.24)
GFR, Estimated: >60 mL/min (ref >60)
Glucose, Bld: 115 mg/dL — ABNORMAL HIGH (ref 70–99)
Potassium: 3.9 mmol/L (ref 3.5–5.1)
Sodium: 141 mmol/L (ref 135–145)

## 2023-03-26 LAB — SURGICAL PCR SCREEN
MRSA, PCR: NEGATIVE
Staphylococcus aureus: POSITIVE — AB

## 2023-03-26 LAB — LIPID PANEL
Cholesterol: 95 mg/dL (ref 0–200)
HDL: 24 mg/dL — ABNORMAL LOW (ref >40)
LDL Cholesterol: 16 mg/dL (ref 0–99)
Total CHOL/HDL Ratio: 4 {ratio}
Triglycerides: 274 mg/dL — ABNORMAL HIGH (ref <150)
VLDL: 55 mg/dL — ABNORMAL HIGH (ref 0–40)

## 2023-03-26 SURGERY — PACEMAKER IMPLANT
Anesthesia: LOCAL

## 2023-03-26 SURGERY — TEMPORARY PACEMAKER
Anesthesia: LOCAL

## 2023-03-26 MED ORDER — HEPARIN (PORCINE) IN NACL 1000-0.9 UT/500ML-% IV SOLN
INTRAVENOUS | Status: DC | PRN
  Administered 2023-03-26: 500 mL

## 2023-03-26 MED ORDER — CEFAZOLIN SODIUM-DEXTROSE 2-4 GM/100ML-% IV SOLN
2.0000 g | INTRAVENOUS | Status: AC
  Administered 2023-03-26: 2 g via INTRAVENOUS

## 2023-03-26 MED ORDER — CHLORHEXIDINE GLUCONATE 4 % EX SOLN: 60.0000 mL | Freq: Once | CUTANEOUS | Status: DC

## 2023-03-26 MED ORDER — SODIUM CHLORIDE 0.9 % IV SOLN: INTRAVENOUS | Status: DC

## 2023-03-26 MED ORDER — SODIUM CHLORIDE 0.9 % IV SOLN
INTRAVENOUS | Status: AC
  Filled 2023-03-26: qty 2

## 2023-03-26 MED ORDER — SODIUM CHLORIDE 0.9 % IV SOLN
80.0000 mg | INTRAVENOUS | Status: AC
  Administered 2023-03-26: 80 mg

## 2023-03-26 MED ORDER — CHLORHEXIDINE GLUCONATE CLOTH 2 % EX PADS
6.0000 | MEDICATED_PAD | Freq: Every day | CUTANEOUS | Status: DC
  Administered 2023-03-26 – 2023-03-28 (×3): 6 via TOPICAL

## 2023-03-26 MED ORDER — SODIUM CHLORIDE 0.9 % IV SOLN: 250.0000 mL | INTRAVENOUS | Status: DC

## 2023-03-26 MED ORDER — LIDOCAINE HCL (PF) 1 % IJ SOLN
INTRAMUSCULAR | Status: DC | PRN
  Administered 2023-03-26: 50 mL

## 2023-03-26 MED ORDER — CEFAZOLIN SODIUM-DEXTROSE 2-4 GM/100ML-% IV SOLN
INTRAVENOUS | Status: AC
  Filled 2023-03-26: qty 100

## 2023-03-26 MED ORDER — SODIUM CHLORIDE 0.9% FLUSH
3.0000 mL | INTRAVENOUS | Status: DC | PRN
Start: 2023-03-26 — End: 2023-03-26

## 2023-03-26 MED ORDER — SODIUM CHLORIDE 0.9% FLUSH
3.0000 mL | Freq: Two times a day (BID) | INTRAVENOUS | Status: DC
  Administered 2023-03-26 – 2023-03-28 (×4): 3 mL via INTRAVENOUS

## 2023-03-26 MED ORDER — HEPARIN SODIUM (PORCINE) 5000 UNIT/ML IJ SOLN
5000.0000 [IU] | Freq: Three times a day (TID) | INTRAMUSCULAR | Status: DC
  Administered 2023-03-27 – 2023-03-28 (×4): 5000 [IU] via SUBCUTANEOUS
  Filled 2023-03-26 (×4): qty 1

## 2023-03-26 MED FILL — henylephrine-NaCl Pref Syr 0.8 MG/10ML-0.9% (80 MCG/ML): INTRAVENOUS | Qty: 10 | Status: AC

## 2023-03-26 SURGICAL SUPPLY — 16 items
CABLE SURGICAL S-101-97-12 (CABLE) ×1 IMPLANT
CATH CPS LOCATOR 3D MED (CATHETERS) IMPLANT
CATH CPS LOCATOR 3D MED XLNG (CATHETERS) IMPLANT
HELIX LOCKING TOOL (MISCELLANEOUS) ×1
LEAD ULTIPACE 52 LPA1231/52 (Lead) IMPLANT
LEAD ULTIPACE 65 LPA1231/65 (Lead) IMPLANT
MAT PREVALON FULL STRYKER (MISCELLANEOUS) IMPLANT
PACEMAKER ASSURITY DR-RF (Pacemaker) IMPLANT
PAD DEFIB RADIO PHYSIO CONN (PAD) ×1 IMPLANT
SHEATH 7FR PRELUDE SNAP 13 (SHEATH) IMPLANT
SHEATH 9FR PRELUDE SNAP 13 (SHEATH) IMPLANT
SHEATH PROBE COVER 6X72 (BAG) IMPLANT
SLITTER AGILIS HISPRO (INSTRUMENTS) IMPLANT
TOOL HELIX LOCKING (MISCELLANEOUS) IMPLANT
TRAY PACEMAKER INSERTION (PACKS) ×1 IMPLANT
WIRE HITORQ VERSACORE ST 145CM (WIRE) IMPLANT

## 2023-03-26 NOTE — H&P (View-Only) (Signed)
ELECTROPHYSIOLOGY CONSULT NOTE    Patient ID: Benjamin Aguilar MRN: [id number], DOB/AGE: Aug 20, 1950 72 y.o.  Admit date: 03/25/2023 Date of Consult: 03/26/2023  Primary Physician: Paulina Fusi, MD Primary Cardiologist: None  Electrophysiologist: Dr. Jimmey Ralph, new consult 11/6  Referring Provider: Dr. Jens Som   Patient Profile: Benjamin Aguilar is a 72 y.o. male with a history of HTN, HLD, DM II, hypothyroidism, prostate cancer who is being seen today for the evaluation of complete heart block at the request of Dr. Jens Som.  HPI:  Benjamin Aguilar is a 72 y.o. male who presented to [facility] on 03/25/23 via EMS with reports of dizziness and passing out on toilet.    He was reportedly in his usual state of health.  Woke up and went ot the restroom when he became dizzy and lightheaded.  He passed out and fell forward hitting his head.  EMS was called & he initially refused transport as he felt better on their arrival. EMS was called a second time and he was unresponsive with jerking activity. Initial EMS rhythm notable for wide complex rhythm with V rate in the 20's. He was placed on transcutaneous pacing pads. At time of initial Cardiology evaluation, his transcutaneous pacer was not capturing.  He underwent emergent temporary transvenous pacemaker insertion. His home Toprol was held - last dose on 11/4.  CT Head / Neck assessed and negative for acute intracranial, scalp hematoma noted.  Labs notable for AKI with sr cr of 1.4 / BUN 40. Overnight, his transvenous pacing wire became dislodged and he required CPR. Epinephrine gtt initiated. TVP attempted again but did not capture. TVP wire was re-positioned and capture achieved.   He denies chest pain, palpitations, dyspnea, PND, orthopnea, nausea, vomiting, dizziness, syncope, edema, weight gain, or early satiety.   Labs Potassium3.9 (11/06 0253)   Creatinine, ser  1.07 (11/06 0253) PLT  82* (11/06 0253) HGB  16.8 (11/06  0253) WBC 8.5 (11/06 0253)  .    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    Hypertension    Hypothyroidism    Insomnia    Neuromuscular disorder (HCC)    neuropathy    Peripheral neuropathy    Personal history of prostate cancer 03/25/2022   Testicular hypofunction    Tremor      Surgical History:  Past Surgical History:  Procedure Laterality Date   HERNIA REPAIR     umbilical hernia repair    prostate seed implant      TEMPORARY PACEMAKER N/A 03/25/2023   Procedure: TEMPORARY PACEMAKER;  Surgeon: Marykay Lex, MD;  Location: [facility];  Service: Cardiovascular;  Laterality: N/A;   TEMPORARY PACEMAKER N/A 03/26/2023   Procedure: TEMPORARY PACEMAKER;  Surgeon: Kathleene Hazel, MD;  Location: [facility];  Service: Cardiovascular;  Laterality: N/A;   TOTAL KNEE ARTHROPLASTY  Left 05/06/2016   Procedure: LEFT TOTAL KNEE ARTHROPLASTY;  Surgeon: Ollen Gross, MD;  Location: [facility];  Service: Orthopedics;  Laterality: Left;  Adductor Block     Medications Prior to Admission  Medication Sig Dispense Refill Last Dose   amLODipine (NORVASC) 5 MG tablet Take 5 mg by mouth daily.   03/25/2023   ezetimibe (ZETIA) 10 MG tablet Take 10 mg by mouth daily.   03/24/2023   glipiZIDE (GLUCOTROL) 10 MG tablet Take 10 mg by mouth 2 (two) times daily before a meal.    03/25/2023   hydrALAZINE (APRESOLINE) 25 MG tablet Take 25 mg by mouth 2 (two) times daily.   03/25/2023   insulin degludec (TRESIBA) 200 UNIT/ML FlexTouch Pen Inject 70  Units into the skin at bedtime.   03/24/2023   JARDIANCE 25 MG TABS tablet Take 25 mg by mouth daily.   03/25/2023   levothyroxine (SYNTHROID, LEVOTHROID) 112 MCG tablet Take 112 mcg by mouth daily before breakfast.   03/25/2023   meloxicam (MOBIC) 15 MG tablet Take 15 mg by mouth daily.   03/24/2023   metFORMIN (GLUCOPHAGE) 1000 MG tablet Take by mouth.   03/25/2023   Multiple Vitamins-Minerals (MULTIVITAMIN MEN 50+) TABS Take 1 tablet by mouth daily.   03/25/2023   omeprazole (PRILOSEC) 20 MG capsule Take 20 mg by mouth daily.   03/25/2023   pregabalin (LYRICA) 300 MG capsule Take 300 mg by mouth 2 (two) times daily.   03/25/2023   rosuvastatin (CRESTOR) 20 MG tablet Take 20 mg by mouth at bedtime.   03/24/2023   Semaglutide, 1 MG/DOSE, (OZEMPIC, 1 MG/DOSE,) 4 MG/3ML SOPN Inject 2 mg into the skin once a week. Thursday.   03/20/2023   VASCEPA 1 g capsule Take 2 g by mouth 2 (two) times daily.   03/25/2023   ACCU-CHEK GUIDE test strip Use to check blood sugar twice a day or as directed.      BD PEN NEEDLE NANO U/F 32G X 4 MM MISC SMARTSIG:injection Twice Daily       Inpatient Medications:   Chlorhexidine Gluconate Cloth  6 each Topical Daily   ezetimibe  10 mg Oral Daily   heparin  5,000 Units Subcutaneous Q8H   icosapent Ethyl  2 g Oral BID   insulin aspart  0-9 Units Subcutaneous TID PC & HS   rosuvastatin  20 mg Oral QHS    Allergies:  Allergies  Allergen Reactions   Ace Inhibitors    Alcohol Other (See Comments)    Pt is a recovering alcoholic -- prefers to not have any medication with alcohol in it.     Family History  Problem Relation Age of Onset   Heart failure Mother    Stroke Mother    Non-Hodgkin's lymphoma Mother        dx 73s   Hypertension Father    Lung cancer Father 80       mets   Prostate cancer Brother 84   Colon cancer Maternal Grandfather        dx after 20     Physical Exam: Vitals:   03/26/23 0645 03/26/23 0700 03/26/23 0800 03/26/23 1000  BP: 106/82  110/83 111/86 (!) 141/110  Pulse: 80 80 80 80  Resp: 16 (!) 21 (!) 22 17  Temp:  98.4 F (36.9 C)    TempSrc:  Oral    SpO2: 96% 95% 91% 95%  Weight:      Height:  GEN- NAD, A&O x 3, normal affect HEENT: Normocephalic, atraumatic, small abrasion to right forehead, bridge of nose Lungs- CTAB, Normal effort.  Heart- Regular rate and rhythm, VP, No M/G/R.  GI- Soft, NT, ND.  Extremities- No clubbing, cyanosis, or edema   Radiology/Studies: CARDIAC CATHETERIZATION  Result Date: 03/26/2023 Successful repositioning of the trans venous pacing wire into the RV.   DG CHEST PORT 1 VIEW  Result Date: 03/26/2023 CLINICAL DATA:  Code blue EXAM: PORTABLE CHEST 1 VIEW COMPARISON:  12/25/2018 FINDINGS: Pacer wire enters the heart from the IVC with the tip in the right atrium. The heart mediastinal contours within normal limits. Aortic atherosclerosis. Interstitial prominence throughout the lungs may reflect interstitial edema. No effusions or pneumothorax. No acute bony abnormality. IMPRESSION: Pacer wire appears to be in the right atrium. Diffuse interstitial prominence, likely interstitial edema. Electronically Signed   By: Charlett Nose M.D.   On: 03/26/2023 00:31   CT Cervical Spine Wo Contrast  Result Date: 03/25/2023 CLINICAL DATA:  Trauma EXAM: CT HEAD WITHOUT CONTRAST CT CERVICAL SPINE WITHOUT CONTRAST TECHNIQUE: Multidetector CT imaging of the head and cervical spine was performed following the standard protocol without intravenous contrast. Multiplanar CT image reconstructions of the cervical spine were also generated. RADIATION DOSE REDUCTION: This exam was performed according to the departmental dose-optimization program which includes automated exposure control, adjustment of the mA and/or kV according to patient size and/or use of iterative reconstruction technique. COMPARISON:  None Available. FINDINGS: CT HEAD FINDINGS Brain: There is no mass, hemorrhage or extra-axial collection.  The size and configuration of the ventricles and extra-axial CSF spaces are normal. The brain parenchyma is normal, without evidence of acute or chronic infarction. Vascular: No abnormal hyperdensity of the major intracranial arteries or dural venous sinuses. No intracranial atherosclerosis. Skull: Small right frontal scalp hematoma.  No skull fracture. Sinuses/Orbits: No fluid levels or advanced mucosal thickening of the visualized paranasal sinuses. No mastoid or middle ear effusion. The orbits are normal. CT CERVICAL SPINE FINDINGS Alignment: No static subluxation. Facets are aligned. Occipital condyles are normally positioned. Skull base and vertebrae: No acute fracture. Soft tissues and spinal canal: No prevertebral fluid or swelling. No visible canal hematoma. Disc levels: Multilevel lower cervical degenerative disc disease. No bony spinal canal stenosis. Upper chest: No pneumothorax, pulmonary nodule or pleural effusion. Other: Normal visualized paraspinal cervical soft tissues. IMPRESSION: 1. No acute intracranial abnormality. 2. Small right frontal scalp hematoma without skull fracture. 3. No acute fracture or static subluxation of the cervical spine. Electronically Signed   By: Deatra Robinson M.D.   On: 03/25/2023 19:18   CT Head Wo Contrast  Result Date: 03/25/2023 CLINICAL DATA:  Trauma EXAM: CT HEAD WITHOUT CONTRAST CT CERVICAL SPINE WITHOUT CONTRAST TECHNIQUE: Multidetector CT imaging of the head and cervical spine was performed following the standard protocol without intravenous contrast. Multiplanar CT image reconstructions of the cervical spine were also generated. RADIATION DOSE REDUCTION: This exam was performed according to the departmental dose-optimization program which includes automated exposure control, adjustment of the mA and/or kV according to patient size and/or use of iterative reconstruction technique. COMPARISON:  None Available. FINDINGS: CT HEAD FINDINGS Brain: There is no mass,  hemorrhage or extra-axial collection. The size and configuration of the ventricles and extra-axial CSF spaces are normal. The brain parenchyma is normal, without evidence of acute or chronic infarction. Vascular: No abnormal hyperdensity of the major intracranial arteries or dural venous sinuses. No intracranial atherosclerosis. Skull: Small right frontal scalp hematoma.  No skull fracture. Sinuses/Orbits: No fluid levels or advanced mucosal thickening of the visualized paranasal sinuses. No mastoid or middle ear effusion. The orbits are normal. CT CERVICAL SPINE FINDINGS Alignment: No static subluxation. Facets are aligned. Occipital condyles are normally positioned. Skull base and vertebrae: No acute fracture. Soft tissues and spinal canal: No prevertebral fluid or swelling. No visible canal hematoma. Disc levels: Multilevel lower cervical degenerative disc disease. No bony spinal canal stenosis. Upper chest: No pneumothorax, pulmonary nodule or pleural effusion. Other: Normal visualized paraspinal cervical soft tissues. IMPRESSION: 1. No acute intracranial abnormality. 2. Small right frontal scalp hematoma without skull fracture. 3. No acute fracture or static subluxation of the cervical spine. Electronically Signed   By: Deatra Robinson M.D.   On: 03/25/2023 19:18   ECHOCARDIOGRAM COMPLETE  Result Date: 03/25/2023    ECHOCARDIOGRAM REPORT   Patient Name:   Benjamin Aguilar Date of Exam: 03/25/2023 Medical Rec #:  [id number]             Height:       69.0 in Accession #:    [id number]            Weight:       230.0 lb Date of Birth:  08-Mar-1951             BSA:          2.192 m Patient Age:    72 years              BP:           103/78 mmHg Patient Gender: M                     HR:           80 bpm. Exam Location:  Inpatient Procedure: 2D Echo, Color Doppler and Cardiac Doppler Indications:    Complete Heart Block  History:        Patient has no prior history of Echocardiogram examinations.                  Prostate Cancer, Polycythemia, Alcoholic Cirrhosis,                 Arrythmias:Complete Heart Block; Risk Factors:Current Smoker,                 Dyslipidemia, Diabetes, Hypertension and ETOH Abuse.  Sonographer:    Milbert Coulter Referring Phys: [phone] LINDSAY B ROBERTS  Sonographer Comments: No parasternal window and patient is obese. Patient on supine bed rest d/t temp pacer placement. IMPRESSIONS  1. Left ventricular ejection fraction, by estimation, is 55 to 60%. The left ventricle has normal function. Left ventricular endocardial border not optimally defined to evaluate regional wall motion. Indeterminate diastolic filling due to E-A fusion.  2. Right ventricular systolic function is normal. The right ventricular size is normal. Tricuspid regurgitation signal is inadequate for assessing PA pressure.  3. The mitral valve is grossly normal. No evidence of mitral valve regurgitation. No evidence of mitral stenosis.  4. The aortic valve was not well visualized. Aortic valve regurgitation is not visualized.  5. The inferior vena cava is normal in size with greater than 50% respiratory variability, suggesting right atrial pressure of 3 mmHg. FINDINGS  Left Ventricle: Left ventricular ejection fraction, by estimation, is 55 to 60%. The left ventricle has normal function. Left ventricular endocardial border not optimally defined to evaluate regional wall motion. The left ventricular internal cavity size was normal  in size. Suboptimal image quality limits for assessment of left ventricular hypertrophy. Indeterminate diastolic filling due to E-A fusion. Right Ventricle: The right ventricular size is normal. No increase in right ventricular wall thickness. Right ventricular systolic function is normal. Tricuspid regurgitation signal is inadequate for assessing PA pressure. Left Atrium: Left atrial size was normal in size. Right Atrium: Right atrial size was normal in size. Pericardium: There is no evidence of pericardial  effusion. Presence of epicardial fat layer. Mitral Valve: The mitral valve is grossly normal. No evidence of mitral valve regurgitation. No evidence of mitral valve stenosis. Tricuspid Valve: The tricuspid valve is grossly normal. Tricuspid valve regurgitation is not demonstrated. No evidence of tricuspid stenosis. Aortic Valve: The aortic valve was not well visualized. Aortic valve regurgitation is not visualized. Aortic valve mean gradient measures 4.0 mmHg. Aortic valve peak gradient measures 6.9 mmHg. Pulmonic Valve: The pulmonic valve was not well visualized. Pulmonic valve regurgitation is not visualized. Aorta: The aortic root was not well visualized. Venous: The inferior vena cava is normal in size with greater than 50% respiratory variability, suggesting right atrial pressure of 3 mmHg. IAS/Shunts: The atrial septum is grossly normal. Additional Comments: A device lead is visualized in the right atrium and inferior vena cava.   Diastology LV e' medial:    7.72 cm/s LV E/e' medial:  9.8 LV e' lateral:   5.87 cm/s LV E/e' lateral: 12.9  RIGHT VENTRICLE RV Basal diam:  3.70 cm RV Mid diam:    2.60 cm RV S prime:     13.10 cm/s TAPSE (M-mode): 2.2 cm LEFT ATRIUM             Index        RIGHT ATRIUM           Index LA Vol (A2C):   63.9 ml 29.15 ml/m  RA Area:     23.40 cm LA Vol (A4C):   36.6 ml 16.69 ml/m  RA Volume:   73.60 ml  33.57 ml/m LA Biplane Vol: 50.9 ml 23.22 ml/m  AORTIC VALVE AV Vmax:           131.00 cm/s AV Vmean:          88.200 cm/s AV VTI:            0.192 m AV Peak Grad:      6.9 mmHg AV Mean Grad:      4.0 mmHg LVOT Vmax:         94.40 cm/s LVOT Vmean:        61.600 cm/s LVOT VTI:          0.170 m LVOT/AV VTI ratio: 0.89 MITRAL VALVE MV Area (PHT): 2.39 cm    SHUNTS MV Decel Time: 317 msec    Systemic VTI: 0.17 m MV E velocity: 75.50 cm/s Lennie Odor MD Electronically signed by Lennie Odor MD Signature Date/Time: 03/25/2023/5:16:49 PM    Final    CARDIAC CATHETERIZATION  Result  Date: 03/25/2023 Successful Temporary Transvenous Pacemaker placement via right femoral vein: 65 cm. Rate 80 bpm, 8 mA (threshold 1.5 mm) RECOMMENDATIONS   Anticipated discharge date to be determined.   Will hold antihypertensives most notably beta-blocker.  Will need to initially treat with sliding scale insulin plus minus basal insulin. Await for return of heart rate responsiveness as Toprol wears off.   No indication for antiplatelet therapy at this time . Bryan Lemma, MD   EKG: complete heart block with ventricular escape rhythm (personally reviewed)  TELEMETRY: VP 80's (personally reviewed)  DEVICE HISTORY:  Pending   Assessment/Plan:  Severe Symptomatic Bradycardia  Syncope secondary to above S/p temp pacing wire 11/6, episode of coughing and dislodgment of temp wire with loss of capture, subsequent brief arrest requiring CPR and repositioning of temp wire.  -NPO  -pt consented for PPM, risks / benefits reviewed with patient  -no reversible causes identified  -metoprolol dosing on 11/4, none since  -escape rhythm with VVI 30 -rescheduled SQ heparin for 11/7   For questions or updates, please contact [facility] Please consult [email or web address] for contact info under Cardiology/STEMI.  Signed, Canary Brim, MSN, APRN, NP-C, AGACNP-BC [facility] - Electrophysiology  03/26/2023, 10:52 AM

## 2023-03-26 NOTE — Progress Notes (Signed)
Rounding Note    Patient Name: Benjamin Aguilar Date of Encounter: 03/26/2023  [facility] Cardiologist: New  Subjective   No CP or dyspnea  Inpatient Medications    Scheduled Meds:  Chlorhexidine Gluconate Cloth  6 each Topical Daily   ezetimibe  10 mg Oral Daily   heparin  5,000 Units Subcutaneous Q8H   icosapent Ethyl  2 g Oral BID   insulin aspart  0-9 Units Subcutaneous TID PC & HS   rosuvastatin  20 mg Oral QHS   Continuous Infusions:  epinephrine Stopped (03/26/23 0145)   PRN Meds: acetaminophen, ondansetron (ZOFRAN) IV   Vital Signs    Vitals:   03/26/23 0615 03/26/23 0630 03/26/23 0645 03/26/23 0700  BP: 116/72 123/64 106/82 110/83  Pulse: 79 80 80 80  Resp: 20 (!) 22 16 (!) 21  Temp:    98.4 F (36.9 C)  TempSrc:    Oral  SpO2: 94% 93% 96% 95%  Weight:      Height:        Intake/Output Summary (Last 24 hours) at 03/26/2023 0721 Last data filed at 03/26/2023 8295 Gross per 24 hour  Intake 894.47 ml  Output 800 ml  Net 94.47 ml      03/26/2023    4:30 AM 03/25/2023    4:00 PM 03/25/2023   12:37 PM  Last 3 Weights  Weight (lbs) 226 lb 3.1 oz 216 lb 7.9 oz 230 lb  Weight (kg) 102.6 kg 98.2 kg 104.327 kg      Telemetry    Ventricular pacing- Personally Reviewed   Physical Exam   GEN: No acute distress.   Neck: No JVD Cardiac: RRR, no murmurs, rubs, or gallops.  Respiratory: Clear to auscultation bilaterally. GI: Soft, nontender, non-distended  MS: No edema Neuro:  Nonfocal  Psych: Normal affect   Labs    Chemistry Recent Labs  Lab 03/25/23 1251 03/25/23 1319 03/25/23 1715 03/26/23 0253  NA 143 142 143 141  K 4.9 5.0 5.2* 3.9  CL 105 108 108 110  CO2 23  --  24 21*  GLUCOSE 155* 153* 85 115*  BUN 29* 40* 29* 29*  CREATININE 1.39* 1.40* 1.25* 1.07  CALCIUM 9.6  --  9.7 8.7*  GFRNONAA 54*  --  >60 >60  ANIONGAP 15  --  11 10    Lipids  Recent Labs  Lab 03/26/23 0253  CHOL 95  TRIG 274*  HDL 24*   LDLCALC 16  CHOLHDL 4.0    Hematology Recent Labs  Lab 03/25/23 1251 03/25/23 1319 03/26/23 0253  WBC 14.3*  --  8.5  RBC 6.60*  --  5.85*  HGB 18.8* 19.7* 16.8  HCT 57.8* 58.0* 50.8  MCV 87.6  --  86.8  MCH 28.5  --  28.7  MCHC 32.5  --  33.1  RDW 16.1*  --  14.8  PLT 150  --  82*   Thyroid  Recent Labs  Lab 03/25/23 1715  TSH 2.209     Radiology    CARDIAC CATHETERIZATION  Result Date: 03/26/2023 Successful repositioning of the trans venous pacing wire into the RV.   DG CHEST PORT 1 VIEW  Result Date: 03/26/2023 CLINICAL DATA:  Code blue EXAM: PORTABLE CHEST 1 VIEW COMPARISON:  12/25/2018 FINDINGS: Pacer wire enters the heart from the IVC with the tip in the right atrium. The heart mediastinal contours within normal limits. Aortic atherosclerosis. Interstitial prominence throughout the lungs may reflect interstitial edema. No  effusions or pneumothorax. No acute bony abnormality. IMPRESSION: Pacer wire appears to be in the right atrium. Diffuse interstitial prominence, likely interstitial edema. Electronically Signed   By: Charlett Nose M.D.   On: 03/26/2023 00:31   CT Cervical Spine Wo Contrast  Result Date: 03/25/2023 CLINICAL DATA:  Trauma EXAM: CT HEAD WITHOUT CONTRAST CT CERVICAL SPINE WITHOUT CONTRAST TECHNIQUE: Multidetector CT imaging of the head and cervical spine was performed following the standard protocol without intravenous contrast. Multiplanar CT image reconstructions of the cervical spine were also generated. RADIATION DOSE REDUCTION: This exam was performed according to the departmental dose-optimization program which includes automated exposure control, adjustment of the mA and/or kV according to patient size and/or use of iterative reconstruction technique. COMPARISON:  None Available. FINDINGS: CT HEAD FINDINGS Brain: There is no mass, hemorrhage or extra-axial collection. The size and configuration of the ventricles and extra-axial CSF spaces are normal.  The brain parenchyma is normal, without evidence of acute or chronic infarction. Vascular: No abnormal hyperdensity of the major intracranial arteries or dural venous sinuses. No intracranial atherosclerosis. Skull: Small right frontal scalp hematoma.  No skull fracture. Sinuses/Orbits: No fluid levels or advanced mucosal thickening of the visualized paranasal sinuses. No mastoid or middle ear effusion. The orbits are normal. CT CERVICAL SPINE FINDINGS Alignment: No static subluxation. Facets are aligned. Occipital condyles are normally positioned. Skull base and vertebrae: No acute fracture. Soft tissues and spinal canal: No prevertebral fluid or swelling. No visible canal hematoma. Disc levels: Multilevel lower cervical degenerative disc disease. No bony spinal canal stenosis. Upper chest: No pneumothorax, pulmonary nodule or pleural effusion. Other: Normal visualized paraspinal cervical soft tissues. IMPRESSION: 1. No acute intracranial abnormality. 2. Small right frontal scalp hematoma without skull fracture. 3. No acute fracture or static subluxation of the cervical spine. Electronically Signed   By: Deatra Robinson M.D.   On: 03/25/2023 19:18   CT Head Wo Contrast  Result Date: 03/25/2023 CLINICAL DATA:  Trauma EXAM: CT HEAD WITHOUT CONTRAST CT CERVICAL SPINE WITHOUT CONTRAST TECHNIQUE: Multidetector CT imaging of the head and cervical spine was performed following the standard protocol without intravenous contrast. Multiplanar CT image reconstructions of the cervical spine were also generated. RADIATION DOSE REDUCTION: This exam was performed according to the departmental dose-optimization program which includes automated exposure control, adjustment of the mA and/or kV according to patient size and/or use of iterative reconstruction technique. COMPARISON:  None Available. FINDINGS: CT HEAD FINDINGS Brain: There is no mass, hemorrhage or extra-axial collection. The size and configuration of the ventricles  and extra-axial CSF spaces are normal. The brain parenchyma is normal, without evidence of acute or chronic infarction. Vascular: No abnormal hyperdensity of the major intracranial arteries or dural venous sinuses. No intracranial atherosclerosis. Skull: Small right frontal scalp hematoma.  No skull fracture. Sinuses/Orbits: No fluid levels or advanced mucosal thickening of the visualized paranasal sinuses. No mastoid or middle ear effusion. The orbits are normal. CT CERVICAL SPINE FINDINGS Alignment: No static subluxation. Facets are aligned. Occipital condyles are normally positioned. Skull base and vertebrae: No acute fracture. Soft tissues and spinal canal: No prevertebral fluid or swelling. No visible canal hematoma. Disc levels: Multilevel lower cervical degenerative disc disease. No bony spinal canal stenosis. Upper chest: No pneumothorax, pulmonary nodule or pleural effusion. Other: Normal visualized paraspinal cervical soft tissues. IMPRESSION: 1. No acute intracranial abnormality. 2. Small right frontal scalp hematoma without skull fracture. 3. No acute fracture or static subluxation of the cervical spine. Electronically Signed  By: Deatra Robinson M.D.   On: 03/25/2023 19:18   ECHOCARDIOGRAM COMPLETE  Result Date: 03/25/2023    ECHOCARDIOGRAM REPORT   Patient Name:   ISREAL MOLINE Date of Exam: 03/25/2023 Medical Rec #:  [id number]             Height:       69.0 in Accession #:    [id number]            Weight:       230.0 lb Date of Birth:  [birth year]             BSA:          2.192 m Patient Age:    72 years              BP:           103/78 mmHg Patient Gender: M                     HR:           80 bpm. Exam Location:  Inpatient Procedure: 2D Echo, Color Doppler and Cardiac Doppler Indications:    Complete Heart Block  History:        Patient has no prior history of Echocardiogram examinations.                 Prostate Cancer, Polycythemia, Alcoholic Cirrhosis,                  Arrythmias:Complete Heart Block; Risk Factors:Current Smoker,                 Dyslipidemia, Diabetes, Hypertension and ETOH Abuse.  Sonographer:    Milbert Coulter Referring Phys: [phone] LINDSAY B ROBERTS  Sonographer Comments: No parasternal window and patient is obese. Patient on supine bed rest d/t temp pacer placement. IMPRESSIONS  1. Left ventricular ejection fraction, by estimation, is 55 to 60%. The left ventricle has normal function. Left ventricular endocardial border not optimally defined to evaluate regional wall motion. Indeterminate diastolic filling due to E-A fusion.  2. Right ventricular systolic function is normal. The right ventricular size is normal. Tricuspid regurgitation signal is inadequate for assessing PA pressure.  3. The mitral valve is grossly normal. No evidence of mitral valve regurgitation. No evidence of mitral stenosis.  4. The aortic valve was not well visualized. Aortic valve regurgitation is not visualized.  5. The inferior vena cava is normal in size with greater than 50% respiratory variability, suggesting right atrial pressure of 3 mmHg. FINDINGS  Left Ventricle: Left ventricular ejection fraction, by estimation, is 55 to 60%. The left ventricle has normal function. Left ventricular endocardial border not optimally defined to evaluate regional wall motion. The left ventricular internal cavity size was normal in size. Suboptimal image quality limits for assessment of left ventricular hypertrophy. Indeterminate diastolic filling due to E-A fusion. Right Ventricle: The right ventricular size is normal. No increase in right ventricular wall thickness. Right ventricular systolic function is normal. Tricuspid regurgitation signal is inadequate for assessing PA pressure. Left Atrium: Left atrial size was normal in size. Right Atrium: Right atrial size was normal in size. Pericardium: There is no evidence of pericardial effusion. Presence of epicardial fat layer. Mitral Valve: The mitral  valve is grossly normal. No evidence of mitral valve regurgitation. No evidence of mitral valve stenosis. Tricuspid Valve: The tricuspid valve is grossly normal. Tricuspid valve regurgitation is not demonstrated. No evidence of tricuspid stenosis. Aortic Valve:  The aortic valve was not well visualized. Aortic valve regurgitation is not visualized. Aortic valve mean gradient measures 4.0 mmHg. Aortic valve peak gradient measures 6.9 mmHg. Pulmonic Valve: The pulmonic valve was not well visualized. Pulmonic valve regurgitation is not visualized. Aorta: The aortic root was not well visualized. Venous: The inferior vena cava is normal in size with greater than 50% respiratory variability, suggesting right atrial pressure of 3 mmHg. IAS/Shunts: The atrial septum is grossly normal. Additional Comments: A device lead is visualized in the right atrium and inferior vena cava.   Diastology LV e' medial:    7.72 cm/s LV E/e' medial:  9.8 LV e' lateral:   5.87 cm/s LV E/e' lateral: 12.9  RIGHT VENTRICLE RV Basal diam:  3.70 cm RV Mid diam:    2.60 cm RV S prime:     13.10 cm/s TAPSE (M-mode): 2.2 cm LEFT ATRIUM             Index        RIGHT ATRIUM           Index LA Vol (A2C):   63.9 ml 29.15 ml/m  RA Area:     23.40 cm LA Vol (A4C):   36.6 ml 16.69 ml/m  RA Volume:   73.60 ml  33.57 ml/m LA Biplane Vol: 50.9 ml 23.22 ml/m  AORTIC VALVE AV Vmax:           131.00 cm/s AV Vmean:          88.200 cm/s AV VTI:            0.192 m AV Peak Grad:      6.9 mmHg AV Mean Grad:      4.0 mmHg LVOT Vmax:         94.40 cm/s LVOT Vmean:        61.600 cm/s LVOT VTI:          0.170 m LVOT/AV VTI ratio: 0.89 MITRAL VALVE MV Area (PHT): 2.39 cm    SHUNTS MV Decel Time: 317 msec    Systemic VTI: 0.17 m MV E velocity: 75.50 cm/s Lennie Odor MD Electronically signed by Lennie Odor MD Signature Date/Time: 03/25/2023/5:16:49 PM    Final    CARDIAC CATHETERIZATION  Result Date: 03/25/2023 Successful Temporary Transvenous Pacemaker placement  via right femoral vein: 65 cm. Rate 80 bpm, 8 mA (threshold 1.5 mm) RECOMMENDATIONS   Anticipated discharge date to be determined.   Will hold antihypertensives most notably beta-blocker.  Will need to initially treat with sliding scale insulin plus minus basal insulin. Await for return of heart rate responsiveness as Toprol wears off.   No indication for antiplatelet therapy at this time . Bryan Lemma, MD     Patient Profile     72 y.o. male with past medical history of diabetes mellitus, hypertension, hyperlipidemia, hypothyroidism, COPD, prostate cancer admitted with syncope/complete heart block.  Patient's Toprol discontinued.  Echocardiogram shows normal LV function.  Assessment & Plan     Complete heart block-events noted.  Patient admitted with complete heart block and heart rate of 20.  Transvenous pacemaker was placed but lost capture last evening.  Received transient CPR.  Temporary wire was repositioned and is now capturing.  Will continue to hold Toprol.  Will ask electrophysiology to review as he would likely need pacemaker.  Note he had 2 syncopal episodes yesterday morning.  LV function is normal.  Syncope-due to bradycardia as outlined above.  Plan as outlined.  Note patient did  strike his head but there are no obvious neurological abnormalities.  Will follow clinically and do head CT as indicated. Acute kidney injury-patient's BUN and creatinine 40 and 1.40.  Likely secondary to hypoperfusion.  Renal function improving this AM.  Diabetes mellitus-continue preadmission medications and follow CBGs. Hyperlipidemia-continue Crestor and Zetia. Hypertension-patient's blood pressure is controlled on no medications at this point.  Lisinopril, amlodipine, hydralazine and Toprol on hold.  Will follow and resume medications as needed.  For questions or updates, please contact [facility] Please consult [email or web address] for contact info under        Signed, Olga Millers, MD   03/26/2023, 7:21 AM

## 2023-03-26 NOTE — Interval H&P Note (Signed)
History and Physical Interval Note:  03/26/2023 1:47 PM  Benjamin Aguilar  has presented today for surgery, with the diagnosis of Complete heart block.  The various methods of treatment have been discussed with the patient and family. After consideration of risks, benefits and other options for treatment, the patient has consented to  Procedure(s): PACEMAKER IMPLANT (N/A) as a surgical intervention.  The patient's history has been reviewed, patient examined, no change in status, stable for surgery.  I have reviewed the patient's chart and labs.  Questions were answered to the patient's satisfaction.     Nobie Putnam

## 2023-03-26 NOTE — Consult Note (Addendum)
ELECTROPHYSIOLOGY CONSULT NOTE    Patient ID: Benjamin Aguilar MRN: [id number], DOB/AGE: Aug 20, 1950 72 y.o.  Admit date: 03/25/2023 Date of Consult: 03/26/2023  Primary Physician: Paulina Fusi, MD Primary Cardiologist: None  Electrophysiologist: Dr. Jimmey Ralph, new consult 11/6  Referring Provider: Dr. Jens Som   Patient Profile: Benjamin Aguilar is a 72 y.o. male with a history of HTN, HLD, DM II, hypothyroidism, prostate cancer who is being seen today for the evaluation of complete heart block at the request of Dr. Jens Som.  HPI:  Benjamin Aguilar is a 72 y.o. male who presented to [facility] on 03/25/23 via EMS with reports of dizziness and passing out on toilet.    He was reportedly in his usual state of health.  Woke up and went ot the restroom when he became dizzy and lightheaded.  He passed out and fell forward hitting his head.  EMS was called & he initially refused transport as he felt better on their arrival. EMS was called a second time and he was unresponsive with jerking activity. Initial EMS rhythm notable for wide complex rhythm with V rate in the 20's. He was placed on transcutaneous pacing pads. At time of initial Cardiology evaluation, his transcutaneous pacer was not capturing.  He underwent emergent temporary transvenous pacemaker insertion. His home Toprol was held - last dose on 11/4.  CT Head / Neck assessed and negative for acute intracranial, scalp hematoma noted.  Labs notable for AKI with sr cr of 1.4 / BUN 40. Overnight, his transvenous pacing wire became dislodged and he required CPR. Epinephrine gtt initiated. TVP attempted again but did not capture. TVP wire was re-positioned and capture achieved.   He denies chest pain, palpitations, dyspnea, PND, orthopnea, nausea, vomiting, dizziness, syncope, edema, weight gain, or early satiety.   Labs Potassium3.9 (11/06 0253)   Creatinine, ser  1.07 (11/06 0253) PLT  82* (11/06 0253) HGB  16.8 (11/06  0253) WBC 8.5 (11/06 0253)  .    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    Hypertension    Hypothyroidism    Insomnia    Neuromuscular disorder (HCC)    neuropathy    Peripheral neuropathy    Personal history of prostate cancer 03/25/2022   Testicular hypofunction    Tremor      Surgical History:  Past Surgical History:  Procedure Laterality Date   HERNIA REPAIR     umbilical hernia repair    prostate seed implant      TEMPORARY PACEMAKER N/A 03/25/2023   Procedure: TEMPORARY PACEMAKER;  Surgeon: Marykay Lex, MD;  Location: [facility];  Service: Cardiovascular;  Laterality: N/A;   TEMPORARY PACEMAKER N/A 03/26/2023   Procedure: TEMPORARY PACEMAKER;  Surgeon: Kathleene Hazel, MD;  Location: [facility];  Service: Cardiovascular;  Laterality: N/A;   TOTAL KNEE ARTHROPLASTY  Left 05/06/2016   Procedure: LEFT TOTAL KNEE ARTHROPLASTY;  Surgeon: Ollen Gross, MD;  Location: [facility];  Service: Orthopedics;  Laterality: Left;  Adductor Block     Medications Prior to Admission  Medication Sig Dispense Refill Last Dose   amLODipine (NORVASC) 5 MG tablet Take 5 mg by mouth daily.   03/25/2023   ezetimibe (ZETIA) 10 MG tablet Take 10 mg by mouth daily.   03/24/2023   glipiZIDE (GLUCOTROL) 10 MG tablet Take 10 mg by mouth 2 (two) times daily before a meal.    03/25/2023   hydrALAZINE (APRESOLINE) 25 MG tablet Take 25 mg by mouth 2 (two) times daily.   03/25/2023   insulin degludec (TRESIBA) 200 UNIT/ML FlexTouch Pen Inject 70  Units into the skin at bedtime.   03/24/2023   JARDIANCE 25 MG TABS tablet Take 25 mg by mouth daily.   03/25/2023   levothyroxine (SYNTHROID, LEVOTHROID) 112 MCG tablet Take 112 mcg by mouth daily before breakfast.   03/25/2023   meloxicam (MOBIC) 15 MG tablet Take 15 mg by mouth daily.   03/24/2023   metFORMIN (GLUCOPHAGE) 1000 MG tablet Take by mouth.   03/25/2023   Multiple Vitamins-Minerals (MULTIVITAMIN MEN 50+) TABS Take 1 tablet by mouth daily.   03/25/2023   omeprazole (PRILOSEC) 20 MG capsule Take 20 mg by mouth daily.   03/25/2023   pregabalin (LYRICA) 300 MG capsule Take 300 mg by mouth 2 (two) times daily.   03/25/2023   rosuvastatin (CRESTOR) 20 MG tablet Take 20 mg by mouth at bedtime.   03/24/2023   Semaglutide, 1 MG/DOSE, (OZEMPIC, 1 MG/DOSE,) 4 MG/3ML SOPN Inject 2 mg into the skin once a week. Thursday.   03/20/2023   VASCEPA 1 g capsule Take 2 g by mouth 2 (two) times daily.   03/25/2023   ACCU-CHEK GUIDE test strip Use to check blood sugar twice a day or as directed.      BD PEN NEEDLE NANO U/F 32G X 4 MM MISC SMARTSIG:injection Twice Daily       Inpatient Medications:   Chlorhexidine Gluconate Cloth  6 each Topical Daily   ezetimibe  10 mg Oral Daily   heparin  5,000 Units Subcutaneous Q8H   icosapent Ethyl  2 g Oral BID   insulin aspart  0-9 Units Subcutaneous TID PC & HS   rosuvastatin  20 mg Oral QHS    Allergies:  Allergies  Allergen Reactions   Ace Inhibitors    Alcohol Other (See Comments)    Pt is a recovering alcoholic -- prefers to not have any medication with alcohol in it.     Family History  Problem Relation Age of Onset   Heart failure Mother    Stroke Mother    Non-Hodgkin's lymphoma Mother        dx 73s   Hypertension Father    Lung cancer Father 80       mets   Prostate cancer Brother 84   Colon cancer Maternal Grandfather        dx after 20     Physical Exam: Vitals:   03/26/23 0645 03/26/23 0700 03/26/23 0800 03/26/23 1000  BP: 106/82  110/83 111/86 (!) 141/110  Pulse: 80 80 80 80  Resp: 16 (!) 21 (!) 22 17  Temp:  98.4 F (36.9 C)    TempSrc:  Oral    SpO2: 96% 95% 91% 95%  Weight:      Height:  GEN- NAD, A&O x 3, normal affect HEENT: Normocephalic, atraumatic, small abrasion to right forehead, bridge of nose Lungs- CTAB, Normal effort.  Heart- Regular rate and rhythm, VP, No M/G/R.  GI- Soft, NT, ND.  Extremities- No clubbing, cyanosis, or edema   Radiology/Studies: CARDIAC CATHETERIZATION  Result Date: 03/26/2023 Successful repositioning of the trans venous pacing wire into the RV.   DG CHEST PORT 1 VIEW  Result Date: 03/26/2023 CLINICAL DATA:  Code blue EXAM: PORTABLE CHEST 1 VIEW COMPARISON:  12/25/2018 FINDINGS: Pacer wire enters the heart from the IVC with the tip in the right atrium. The heart mediastinal contours within normal limits. Aortic atherosclerosis. Interstitial prominence throughout the lungs may reflect interstitial edema. No effusions or pneumothorax. No acute bony abnormality. IMPRESSION: Pacer wire appears to be in the right atrium. Diffuse interstitial prominence, likely interstitial edema. Electronically Signed   By: Charlett Nose M.D.   On: 03/26/2023 00:31   CT Cervical Spine Wo Contrast  Result Date: 03/25/2023 CLINICAL DATA:  Trauma EXAM: CT HEAD WITHOUT CONTRAST CT CERVICAL SPINE WITHOUT CONTRAST TECHNIQUE: Multidetector CT imaging of the head and cervical spine was performed following the standard protocol without intravenous contrast. Multiplanar CT image reconstructions of the cervical spine were also generated. RADIATION DOSE REDUCTION: This exam was performed according to the departmental dose-optimization program which includes automated exposure control, adjustment of the mA and/or kV according to patient size and/or use of iterative reconstruction technique. COMPARISON:  None Available. FINDINGS: CT HEAD FINDINGS Brain: There is no mass, hemorrhage or extra-axial collection.  The size and configuration of the ventricles and extra-axial CSF spaces are normal. The brain parenchyma is normal, without evidence of acute or chronic infarction. Vascular: No abnormal hyperdensity of the major intracranial arteries or dural venous sinuses. No intracranial atherosclerosis. Skull: Small right frontal scalp hematoma.  No skull fracture. Sinuses/Orbits: No fluid levels or advanced mucosal thickening of the visualized paranasal sinuses. No mastoid or middle ear effusion. The orbits are normal. CT CERVICAL SPINE FINDINGS Alignment: No static subluxation. Facets are aligned. Occipital condyles are normally positioned. Skull base and vertebrae: No acute fracture. Soft tissues and spinal canal: No prevertebral fluid or swelling. No visible canal hematoma. Disc levels: Multilevel lower cervical degenerative disc disease. No bony spinal canal stenosis. Upper chest: No pneumothorax, pulmonary nodule or pleural effusion. Other: Normal visualized paraspinal cervical soft tissues. IMPRESSION: 1. No acute intracranial abnormality. 2. Small right frontal scalp hematoma without skull fracture. 3. No acute fracture or static subluxation of the cervical spine. Electronically Signed   By: Deatra Robinson M.D.   On: 03/25/2023 19:18   CT Head Wo Contrast  Result Date: 03/25/2023 CLINICAL DATA:  Trauma EXAM: CT HEAD WITHOUT CONTRAST CT CERVICAL SPINE WITHOUT CONTRAST TECHNIQUE: Multidetector CT imaging of the head and cervical spine was performed following the standard protocol without intravenous contrast. Multiplanar CT image reconstructions of the cervical spine were also generated. RADIATION DOSE REDUCTION: This exam was performed according to the departmental dose-optimization program which includes automated exposure control, adjustment of the mA and/or kV according to patient size and/or use of iterative reconstruction technique. COMPARISON:  None Available. FINDINGS: CT HEAD FINDINGS Brain: There is no mass,  hemorrhage or extra-axial collection. The size and configuration of the ventricles and extra-axial CSF spaces are normal. The brain parenchyma is normal, without evidence of acute or chronic infarction. Vascular: No abnormal hyperdensity of the major intracranial arteries or dural venous sinuses. No intracranial atherosclerosis. Skull: Small right frontal scalp hematoma.  No skull fracture. Sinuses/Orbits: No fluid levels or advanced mucosal thickening of the visualized paranasal sinuses. No mastoid or middle ear effusion. The orbits are normal. CT CERVICAL SPINE FINDINGS Alignment: No static subluxation. Facets are aligned. Occipital condyles are normally positioned. Skull base and vertebrae: No acute fracture. Soft tissues and spinal canal: No prevertebral fluid or swelling. No visible canal hematoma. Disc levels: Multilevel lower cervical degenerative disc disease. No bony spinal canal stenosis. Upper chest: No pneumothorax, pulmonary nodule or pleural effusion. Other: Normal visualized paraspinal cervical soft tissues. IMPRESSION: 1. No acute intracranial abnormality. 2. Small right frontal scalp hematoma without skull fracture. 3. No acute fracture or static subluxation of the cervical spine. Electronically Signed   By: Deatra Robinson M.D.   On: 03/25/2023 19:18   ECHOCARDIOGRAM COMPLETE  Result Date: 03/25/2023    ECHOCARDIOGRAM REPORT   Patient Name:   CAYETANO MIKITA Date of Exam: 03/25/2023 Medical Rec #:  [id number]             Height:       69.0 in Accession #:    [id number]            Weight:       230.0 lb Date of Birth:  08-Mar-1951             BSA:          2.192 m Patient Age:    72 years              BP:           103/78 mmHg Patient Gender: M                     HR:           80 bpm. Exam Location:  Inpatient Procedure: 2D Echo, Color Doppler and Cardiac Doppler Indications:    Complete Heart Block  History:        Patient has no prior history of Echocardiogram examinations.                  Prostate Cancer, Polycythemia, Alcoholic Cirrhosis,                 Arrythmias:Complete Heart Block; Risk Factors:Current Smoker,                 Dyslipidemia, Diabetes, Hypertension and ETOH Abuse.  Sonographer:    Milbert Coulter Referring Phys: [phone] LINDSAY B ROBERTS  Sonographer Comments: No parasternal window and patient is obese. Patient on supine bed rest d/t temp pacer placement. IMPRESSIONS  1. Left ventricular ejection fraction, by estimation, is 55 to 60%. The left ventricle has normal function. Left ventricular endocardial border not optimally defined to evaluate regional wall motion. Indeterminate diastolic filling due to E-A fusion.  2. Right ventricular systolic function is normal. The right ventricular size is normal. Tricuspid regurgitation signal is inadequate for assessing PA pressure.  3. The mitral valve is grossly normal. No evidence of mitral valve regurgitation. No evidence of mitral stenosis.  4. The aortic valve was not well visualized. Aortic valve regurgitation is not visualized.  5. The inferior vena cava is normal in size with greater than 50% respiratory variability, suggesting right atrial pressure of 3 mmHg. FINDINGS  Left Ventricle: Left ventricular ejection fraction, by estimation, is 55 to 60%. The left ventricle has normal function. Left ventricular endocardial border not optimally defined to evaluate regional wall motion. The left ventricular internal cavity size was normal  in size. Suboptimal image quality limits for assessment of left ventricular hypertrophy. Indeterminate diastolic filling due to E-A fusion. Right Ventricle: The right ventricular size is normal. No increase in right ventricular wall thickness. Right ventricular systolic function is normal. Tricuspid regurgitation signal is inadequate for assessing PA pressure. Left Atrium: Left atrial size was normal in size. Right Atrium: Right atrial size was normal in size. Pericardium: There is no evidence of pericardial  effusion. Presence of epicardial fat layer. Mitral Valve: The mitral valve is grossly normal. No evidence of mitral valve regurgitation. No evidence of mitral valve stenosis. Tricuspid Valve: The tricuspid valve is grossly normal. Tricuspid valve regurgitation is not demonstrated. No evidence of tricuspid stenosis. Aortic Valve: The aortic valve was not well visualized. Aortic valve regurgitation is not visualized. Aortic valve mean gradient measures 4.0 mmHg. Aortic valve peak gradient measures 6.9 mmHg. Pulmonic Valve: The pulmonic valve was not well visualized. Pulmonic valve regurgitation is not visualized. Aorta: The aortic root was not well visualized. Venous: The inferior vena cava is normal in size with greater than 50% respiratory variability, suggesting right atrial pressure of 3 mmHg. IAS/Shunts: The atrial septum is grossly normal. Additional Comments: A device lead is visualized in the right atrium and inferior vena cava.   Diastology LV e' medial:    7.72 cm/s LV E/e' medial:  9.8 LV e' lateral:   5.87 cm/s LV E/e' lateral: 12.9  RIGHT VENTRICLE RV Basal diam:  3.70 cm RV Mid diam:    2.60 cm RV S prime:     13.10 cm/s TAPSE (M-mode): 2.2 cm LEFT ATRIUM             Index        RIGHT ATRIUM           Index LA Vol (A2C):   63.9 ml 29.15 ml/m  RA Area:     23.40 cm LA Vol (A4C):   36.6 ml 16.69 ml/m  RA Volume:   73.60 ml  33.57 ml/m LA Biplane Vol: 50.9 ml 23.22 ml/m  AORTIC VALVE AV Vmax:           131.00 cm/s AV Vmean:          88.200 cm/s AV VTI:            0.192 m AV Peak Grad:      6.9 mmHg AV Mean Grad:      4.0 mmHg LVOT Vmax:         94.40 cm/s LVOT Vmean:        61.600 cm/s LVOT VTI:          0.170 m LVOT/AV VTI ratio: 0.89 MITRAL VALVE MV Area (PHT): 2.39 cm    SHUNTS MV Decel Time: 317 msec    Systemic VTI: 0.17 m MV E velocity: 75.50 cm/s Lennie Odor MD Electronically signed by Lennie Odor MD Signature Date/Time: 03/25/2023/5:16:49 PM    Final    CARDIAC CATHETERIZATION  Result  Date: 03/25/2023 Successful Temporary Transvenous Pacemaker placement via right femoral vein: 65 cm. Rate 80 bpm, 8 mA (threshold 1.5 mm) RECOMMENDATIONS   Anticipated discharge date to be determined.   Will hold antihypertensives most notably beta-blocker.  Will need to initially treat with sliding scale insulin plus minus basal insulin. Await for return of heart rate responsiveness as Toprol wears off.   No indication for antiplatelet therapy at this time . Bryan Lemma, MD   EKG: complete heart block with ventricular escape rhythm (personally reviewed)  TELEMETRY: VP 80's (personally reviewed)  DEVICE HISTORY:  Pending   Assessment/Plan:  Severe Symptomatic Bradycardia  Syncope secondary to above S/p temp pacing wire 11/6, episode of coughing and dislodgment of temp wire with loss of capture, subsequent brief arrest requiring CPR and repositioning of temp wire.  -NPO  -pt consented for PPM, risks / benefits reviewed with patient  -no reversible causes identified  -metoprolol dosing on 11/4, none since  -escape rhythm with VVI 30 -rescheduled SQ heparin for 11/7   For questions or updates, please contact [facility] Please consult [email or web address] for contact info under Cardiology/STEMI.  Signed, Canary Brim, MSN, APRN, NP-C, AGACNP-BC [facility] - Electrophysiology  03/26/2023, 10:52 AM

## 2023-03-26 NOTE — Discharge Instructions (Addendum)
Medication Changes: - DECREASE Toprol-XL to 50mg  daily. - STOP Hydralazine. - STOP Meloxicam. Recommend using over the counter Tylenol as needed for pain. - We have provided you a new prescription of your albuterol inhaler.   We have referred you to Pulmonology for further management of your COPD given new supplemental O2 requirement. Please also call and schedule a follow-up visit with your PCP.   After Your Pacemaker   You have a [employer] Pacemaker  ACTIVITY Do not lift your arm above shoulder height for 1 week after your procedure. After 7 days, you may progress as below.  You should remove your sling 24 hours after your procedure, unless otherwise instructed by your provider.     Wednesday April 02, 2023  Thursday April 03, 2023 Friday April 04, 2023 Saturday April 05, 2023   Do not lift, push, pull, or carry anything over 10 pounds with the affected arm until 6 weeks (Wednesday May 07, 2023 ) after your procedure.   You may drive AFTER your wound check, unless you have been told otherwise by your provider.   Ask your healthcare provider when you can go back to work   INCISION/Dressing If large square, outer bandage is left in place, this can be removed after 24 hours from your procedure. Do not remove steri-strips or glue as below.   If a PRESSURE DRESSING (a bulky dressing that usually goes up over your shoulder) was applied or left in place, please follow instructions given by your provider on when to return to have this removed.   Monitor your Pacemaker site for redness, swelling, and drainage. Call the device clinic at [phone] if you experience these symptoms or fever/chills.  If your incision is sealed with Steri-strips or staples, you may shower 7 days after your procedure or when told by your provider. Do not remove the steri-strips or let the shower hit directly on your site. You may wash around your site with soap and water.    If you were  discharged in a sling, please do not wear this during the day more than 48 hours after your surgery unless otherwise instructed. This may increase the risk of stiffness and soreness in your shoulder.   Avoid lotions, ointments, or perfumes over your incision until it is well-healed.  You may use a hot tub or a pool AFTER your wound check appointment if the incision is completely closed.  Pacemaker Alerts:  Some alerts are vibratory and others beep. These are NOT emergencies. Please call our office to let us know. If this occurs at night or on weekends, it can wait until the next business day. Send a remote transmission.  If your device is capable of reading fluid status (for heart failure), you will be offered monthly monitoring to review this with you.   DEVICE MANAGEMENT Remote monitoring is used to monitor your pacemaker from home. This monitoring is scheduled every 91 days by our office. It allows [place] to keep an eye on the functioning of your device to ensure it is working properly. You will routinely see your Electrophysiologist annually (more often if necessary).   You should receive your ID card for your new device in 4-8 weeks. Keep this card with you at all times once received. Consider wearing a medical alert bracelet or necklace.  Your Pacemaker may be MRI compatible. This will be discussed at your next office visit/wound check.  You should avoid contact with strong electric or magnetic fields.   Do not  use amateur (ham) Scientific laboratory technician (arc) Optician, dispensing. MP3 player headphones with magnets should not be used. Some devices are safe to use if held at least 12 inches (30 cm) from your Pacemaker. These include power tools, lawn mowers, and speakers. If you are unsure if something is safe to use, ask your health care provider.  When using your cell phone, hold it to the ear that is on the opposite side from the Pacemaker. Do not leave your cell phone in a pocket over the  Pacemaker.  You may safely use electric blankets, heating pads, computers, and microwave ovens.  Call the office right away if: You have chest pain. You feel more short of breath than you have felt before. You feel more light-headed than you have felt before. Your incision starts to open up.  This information is not intended to replace advice given to you by your health care provider. Make sure you discuss any questions you have with your health care provider.

## 2023-03-26 NOTE — Progress Notes (Signed)
   03/25/23 2320  Spiritual Encounters  Type of Visit Initial  Care provided to: Pt not available  Referral source Trauma page;Code page  Reason for visit Code  OnCall Visit No   Chaplain responded to a code blue. The patient Benjamin Aguilar was attended to by the medical team.  No family is present. If a chaplain is requested someone will respond.   Valerie Roys [facility]  [phone]

## 2023-03-27 ENCOUNTER — Encounter (HOSPITAL_COMMUNITY): Payer: Self-pay | Admitting: Cardiology

## 2023-03-27 ENCOUNTER — Inpatient Hospital Stay (HOSPITAL_COMMUNITY): Payer: Medicare Other

## 2023-03-27 DIAGNOSIS — I10 Essential (primary) hypertension: Secondary | ICD-10-CM | POA: Diagnosis not present

## 2023-03-27 LAB — CBC
HCT: 50 % (ref 39.0–52.0)
Hemoglobin: 16.4 g/dL (ref 13.0–17.0)
MCH: 28.3 pg (ref 26.0–34.0)
MCHC: 32.8 g/dL (ref 30.0–36.0)
MCV: 86.2 fL (ref 80.0–100.0)
Platelets: 80 10*3/uL — ABNORMAL LOW (ref 150–400)
RBC: 5.8 MIL/uL (ref 4.22–5.81)
RDW: 14.8 % (ref 11.5–15.5)
WBC: 7.3 10*3/uL (ref 4.0–10.5)
nRBC: 0 % (ref 0.0–0.2)

## 2023-03-27 LAB — GLUCOSE, CAPILLARY
Glucose-Capillary: 134 mg/dL — ABNORMAL HIGH (ref 70–99)
Glucose-Capillary: 201 mg/dL — ABNORMAL HIGH (ref 70–99)
Glucose-Capillary: 135 mg/dL — ABNORMAL HIGH (ref 70–99)
Glucose-Capillary: 152 mg/dL — ABNORMAL HIGH (ref 70–99)

## 2023-03-27 LAB — BRAIN NATRIURETIC PEPTIDE: B Natriuretic Peptide: 564 pg/mL — ABNORMAL HIGH (ref 0.0–100.0)

## 2023-03-27 LAB — BASIC METABOLIC PANEL
Anion gap: 10 (ref 5–15)
BUN: 25 mg/dL — ABNORMAL HIGH (ref 8–23)
CO2: 23 mmol/L (ref 22–32)
Calcium: 9 mg/dL (ref 8.9–10.3)
Chloride: 106 mmol/L (ref 98–111)
Creatinine, Ser: 0.91 mg/dL (ref 0.61–1.24)
GFR, Estimated: >60 mL/min (ref >60)
Glucose, Bld: 83 mg/dL (ref 70–99)
Potassium: 4.3 mmol/L (ref 3.5–5.1)
Sodium: 139 mmol/L (ref 135–145)

## 2023-03-27 MED ORDER — METOPROLOL SUCCINATE ER 50 MG PO TB24: 100.0000 mg | ORAL_TABLET | Freq: Every day | ORAL | Status: DC

## 2023-03-27 MED ORDER — FUROSEMIDE 10 MG/ML IJ SOLN
40.0000 mg | Freq: Once | INTRAMUSCULAR | Status: AC
  Administered 2023-03-27: 40 mg via INTRAVENOUS
  Filled 2023-03-27: qty 4

## 2023-03-27 MED ORDER — METOPROLOL SUCCINATE ER 50 MG PO TB24
50.0000 mg | ORAL_TABLET | Freq: Every day | ORAL | Status: DC
  Administered 2023-03-27: 50 mg via ORAL
  Filled 2023-03-27: qty 1

## 2023-03-27 MED ORDER — MUPIROCIN 2 % EX OINT
1.0000 | TOPICAL_OINTMENT | Freq: Two times a day (BID) | CUTANEOUS | Status: DC
  Administered 2023-03-27 – 2023-03-28 (×3): 1 via NASAL
  Filled 2023-03-27 (×2): qty 22

## 2023-03-27 MED ORDER — TRAZODONE HCL 50 MG PO TABS: 100.0000 mg | ORAL_TABLET | Freq: Every evening | ORAL | Status: DC | PRN

## 2023-03-27 MED ORDER — AMLODIPINE BESYLATE 5 MG PO TABS
5.0000 mg | ORAL_TABLET | Freq: Every day | ORAL | Status: DC
  Administered 2023-03-27 – 2023-03-28 (×2): 5 mg via ORAL
  Filled 2023-03-27 (×2): qty 1

## 2023-03-27 NOTE — Progress Notes (Signed)
Was the fall witnessed: yes, Student nurse Wilkie Aye was witness to fall  Patient condition before and after the fall: no change between assessments before and after fall   Patient's reaction to the fall: No harm to patient, unconcerned that he fell. alert and oriented x4  Name of the doctor that was notified including date and time: Dr. Jens Som notified at 1000  Any interventions and vital signs: no interventions needed, education reiterated that he needed to call. Vital signs as follows. HR: 68 BP: 94/73 (81) Temp 97.9 oral SpO2: 91 on HFNC 8L Pain: no pain 0/10  Lauris Poag Mry Lamia RN 03/27/2023 1028

## 2023-03-27 NOTE — Progress Notes (Signed)
Rounding Note    Patient Name: Benjamin Aguilar Date of Encounter: 03/27/2023  [facility] Cardiologist: New  Subjective   Pt denies CP or dyspnea  Inpatient Medications    Scheduled Meds:  Chlorhexidine Gluconate Cloth  6 each Topical Daily   ezetimibe  10 mg Oral Daily   heparin  5,000 Units Subcutaneous Q8H   icosapent Ethyl  2 g Oral BID   insulin aspart  0-9 Units Subcutaneous TID PC & HS   rosuvastatin  20 mg Oral QHS   sodium chloride flush  3 mL Intravenous Q12H   Continuous Infusions:  epinephrine Stopped (03/26/23 0145)   PRN Meds: acetaminophen, ondansetron (ZOFRAN) IV   Vital Signs    Vitals:   03/27/23 0300 03/27/23 0330 03/27/23 0400 03/27/23 0430  BP: 128/77 (!) 142/84 135/77 139/79  Pulse: 67 77 73 69  Resp: 17 16 19 17   Temp:      TempSrc:      SpO2: 93% 92% 95% 93%  Weight:      Height:        Intake/Output Summary (Last 24 hours) at 03/27/2023 0729 Last data filed at 03/27/2023 0300 Gross per 24 hour  Intake --  Output 1825 ml  Net -1825 ml      03/26/2023    4:30 AM 03/25/2023    4:00 PM 03/25/2023   12:37 PM  Last 3 Weights  Weight (lbs) 226 lb 3.1 oz 216 lb 7.9 oz 230 lb  Weight (kg) 102.6 kg 98.2 kg 104.327 kg      Telemetry    Ventricular pacing- Personally Reviewed   Physical Exam   GEN: NAD Neck: supple Cardiac: RRR Respiratory: CTA; s/p pacemaker with no hematoma GI: Soft, NT/ND MS: No edema Neuro:  Grossly intact Psych: Normal affect   Labs    Chemistry Recent Labs  Lab 03/25/23 1715 03/26/23 0253 03/27/23 0253  NA 143 141 139  K 5.2* 3.9 4.3  CL 108 110 106  CO2 24 21* 23  GLUCOSE 85 115* 83  BUN 29* 29* 25*  CREATININE 1.25* 1.07 0.91  CALCIUM 9.7 8.7* 9.0  GFRNONAA >60 >60 >60  ANIONGAP 11 10 10     Lipids  Recent Labs  Lab 03/26/23 0253  CHOL 95  TRIG 274*  HDL 24*  LDLCALC 16  CHOLHDL 4.0    Hematology Recent Labs  Lab 03/25/23 1251 03/25/23 1319 03/26/23 0253  03/27/23 0253  WBC 14.3*  --  8.5 7.3  RBC 6.60*  --  5.85* 5.80  HGB 18.8* 19.7* 16.8 16.4  HCT 57.8* 58.0* 50.8 50.0  MCV 87.6  --  86.8 86.2  MCH 28.5  --  28.7 28.3  MCHC 32.5  --  33.1 32.8  RDW 16.1*  --  14.8 14.8  PLT 150  --  82* 80*   Thyroid  Recent Labs  Lab 03/25/23 1715  TSH 2.209     Radiology    DG Chest 2 View  Result Date: 03/27/2023 CLINICAL DATA:  72 year old male pacemaker placement. EXAM: CHEST - 2 VIEW COMPARISON:  Portable chest 03/25/2023 and earlier. FINDINGS: AP and lateral views at 0554 hours. New left chest dual lead cardiac pacemaker. No pneumothorax. Mildly larger lung volumes. Stable cardiac size and mediastinal contours. Tortuous thoracic aorta. Visualized tracheal air column is within normal limits. Coarse bilateral pulmonary interstitial opacity is chronic. Mildly increased curvilinear opacity at the right lung base, favor atelectasis. No convincing pulmonary edema. No pleural effusion  or consolidation. No acute osseous abnormality identified. Negative visible bowel gas. IMPRESSION: 1. New left chest dual lead cardiac pacemaker. No pneumothorax or adverse features. 2. Chronic lung disease with mild right basilar atelectasis. Electronically Signed   By: Odessa Fleming M.D.   On: 03/27/2023 06:09   EP PPM/ICD IMPLANT  Result Date: 03/26/2023 CONCLUSIONS:  1. Successful DDD PPM with LBBAP lead implant.  2. Removal of right femoral temporary transvenous pacing wire.  3.  No early apparent complications. Nobie Putnam, MD Cardiac Electrophysiology   CARDIAC CATHETERIZATION  Result Date: 03/26/2023 Successful repositioning of the trans venous pacing wire into the RV.   DG CHEST PORT 1 VIEW  Result Date: 03/26/2023 CLINICAL DATA:  Code blue EXAM: PORTABLE CHEST 1 VIEW COMPARISON:  12/25/2018 FINDINGS: Pacer wire enters the heart from the IVC with the tip in the right atrium. The heart mediastinal contours within normal limits. Aortic atherosclerosis.  Interstitial prominence throughout the lungs may reflect interstitial edema. No effusions or pneumothorax. No acute bony abnormality. IMPRESSION: Pacer wire appears to be in the right atrium. Diffuse interstitial prominence, likely interstitial edema. Electronically Signed   By: Charlett Nose M.D.   On: 03/26/2023 00:31   CT Cervical Spine Wo Contrast  Result Date: 03/25/2023 CLINICAL DATA:  Trauma EXAM: CT HEAD WITHOUT CONTRAST CT CERVICAL SPINE WITHOUT CONTRAST TECHNIQUE: Multidetector CT imaging of the head and cervical spine was performed following the standard protocol without intravenous contrast. Multiplanar CT image reconstructions of the cervical spine were also generated. RADIATION DOSE REDUCTION: This exam was performed according to the departmental dose-optimization program which includes automated exposure control, adjustment of the mA and/or kV according to patient size and/or use of iterative reconstruction technique. COMPARISON:  None Available. FINDINGS: CT HEAD FINDINGS Brain: There is no mass, hemorrhage or extra-axial collection. The size and configuration of the ventricles and extra-axial CSF spaces are normal. The brain parenchyma is normal, without evidence of acute or chronic infarction. Vascular: No abnormal hyperdensity of the major intracranial arteries or dural venous sinuses. No intracranial atherosclerosis. Skull: Small right frontal scalp hematoma.  No skull fracture. Sinuses/Orbits: No fluid levels or advanced mucosal thickening of the visualized paranasal sinuses. No mastoid or middle ear effusion. The orbits are normal. CT CERVICAL SPINE FINDINGS Alignment: No static subluxation. Facets are aligned. Occipital condyles are normally positioned. Skull base and vertebrae: No acute fracture. Soft tissues and spinal canal: No prevertebral fluid or swelling. No visible canal hematoma. Disc levels: Multilevel lower cervical degenerative disc disease. No bony spinal canal stenosis. Upper  chest: No pneumothorax, pulmonary nodule or pleural effusion. Other: Normal visualized paraspinal cervical soft tissues. IMPRESSION: 1. No acute intracranial abnormality. 2. Small right frontal scalp hematoma without skull fracture. 3. No acute fracture or static subluxation of the cervical spine. Electronically Signed   By: Deatra Robinson M.D.   On: 03/25/2023 19:18   CT Head Wo Contrast  Result Date: 03/25/2023 CLINICAL DATA:  Trauma EXAM: CT HEAD WITHOUT CONTRAST CT CERVICAL SPINE WITHOUT CONTRAST TECHNIQUE: Multidetector CT imaging of the head and cervical spine was performed following the standard protocol without intravenous contrast. Multiplanar CT image reconstructions of the cervical spine were also generated. RADIATION DOSE REDUCTION: This exam was performed according to the departmental dose-optimization program which includes automated exposure control, adjustment of the mA and/or kV according to patient size and/or use of iterative reconstruction technique. COMPARISON:  None Available. FINDINGS: CT HEAD FINDINGS Brain: There is no mass, hemorrhage or extra-axial collection. The size and  configuration of the ventricles and extra-axial CSF spaces are normal. The brain parenchyma is normal, without evidence of acute or chronic infarction. Vascular: No abnormal hyperdensity of the major intracranial arteries or dural venous sinuses. No intracranial atherosclerosis. Skull: Small right frontal scalp hematoma.  No skull fracture. Sinuses/Orbits: No fluid levels or advanced mucosal thickening of the visualized paranasal sinuses. No mastoid or middle ear effusion. The orbits are normal. CT CERVICAL SPINE FINDINGS Alignment: No static subluxation. Facets are aligned. Occipital condyles are normally positioned. Skull base and vertebrae: No acute fracture. Soft tissues and spinal canal: No prevertebral fluid or swelling. No visible canal hematoma. Disc levels: Multilevel lower cervical degenerative disc disease.  No bony spinal canal stenosis. Upper chest: No pneumothorax, pulmonary nodule or pleural effusion. Other: Normal visualized paraspinal cervical soft tissues. IMPRESSION: 1. No acute intracranial abnormality. 2. Small right frontal scalp hematoma without skull fracture. 3. No acute fracture or static subluxation of the cervical spine. Electronically Signed   By: Deatra Robinson M.D.   On: 03/25/2023 19:18   ECHOCARDIOGRAM COMPLETE  Result Date: 03/25/2023    ECHOCARDIOGRAM REPORT   Patient Name:   Benjamin Aguilar Date of Exam: 03/25/2023 Medical Rec #:  [id number]             Height:       69.0 in Accession #:    [id number]            Weight:       230.0 lb Date of Birth:  [birth year]             BSA:          2.192 m Patient Age:    72 years              BP:           103/78 mmHg Patient Gender: M                     HR:           80 bpm. Exam Location:  Inpatient Procedure: 2D Echo, Color Doppler and Cardiac Doppler Indications:    Complete Heart Block  History:        Patient has no prior history of Echocardiogram examinations.                 Prostate Cancer, Polycythemia, Alcoholic Cirrhosis,                 Arrythmias:Complete Heart Block; Risk Factors:Current Smoker,                 Dyslipidemia, Diabetes, Hypertension and ETOH Abuse.  Sonographer:    Milbert Coulter Referring Phys: [phone] LINDSAY B ROBERTS  Sonographer Comments: No parasternal window and patient is obese. Patient on supine bed rest d/t temp pacer placement. IMPRESSIONS  1. Left ventricular ejection fraction, by estimation, is 55 to 60%. The left ventricle has normal function. Left ventricular endocardial border not optimally defined to evaluate regional wall motion. Indeterminate diastolic filling due to E-A fusion.  2. Right ventricular systolic function is normal. The right ventricular size is normal. Tricuspid regurgitation signal is inadequate for assessing PA pressure.  3. The mitral valve is grossly normal. No evidence of mitral valve  regurgitation. No evidence of mitral stenosis.  4. The aortic valve was not well visualized. Aortic valve regurgitation is not visualized.  5. The inferior vena cava is normal in size with greater than 50% respiratory variability, suggesting  right atrial pressure of 3 mmHg. FINDINGS  Left Ventricle: Left ventricular ejection fraction, by estimation, is 55 to 60%. The left ventricle has normal function. Left ventricular endocardial border not optimally defined to evaluate regional wall motion. The left ventricular internal cavity size was normal in size. Suboptimal image quality limits for assessment of left ventricular hypertrophy. Indeterminate diastolic filling due to E-A fusion. Right Ventricle: The right ventricular size is normal. No increase in right ventricular wall thickness. Right ventricular systolic function is normal. Tricuspid regurgitation signal is inadequate for assessing PA pressure. Left Atrium: Left atrial size was normal in size. Right Atrium: Right atrial size was normal in size. Pericardium: There is no evidence of pericardial effusion. Presence of epicardial fat layer. Mitral Valve: The mitral valve is grossly normal. No evidence of mitral valve regurgitation. No evidence of mitral valve stenosis. Tricuspid Valve: The tricuspid valve is grossly normal. Tricuspid valve regurgitation is not demonstrated. No evidence of tricuspid stenosis. Aortic Valve: The aortic valve was not well visualized. Aortic valve regurgitation is not visualized. Aortic valve mean gradient measures 4.0 mmHg. Aortic valve peak gradient measures 6.9 mmHg. Pulmonic Valve: The pulmonic valve was not well visualized. Pulmonic valve regurgitation is not visualized. Aorta: The aortic root was not well visualized. Venous: The inferior vena cava is normal in size with greater than 50% respiratory variability, suggesting right atrial pressure of 3 mmHg. IAS/Shunts: The atrial septum is grossly normal. Additional Comments: A  device lead is visualized in the right atrium and inferior vena cava.   Diastology LV e' medial:    7.72 cm/s LV E/e' medial:  9.8 LV e' lateral:   5.87 cm/s LV E/e' lateral: 12.9  RIGHT VENTRICLE RV Basal diam:  3.70 cm RV Mid diam:    2.60 cm RV S prime:     13.10 cm/s TAPSE (M-mode): 2.2 cm LEFT ATRIUM             Index        RIGHT ATRIUM           Index LA Vol (A2C):   63.9 ml 29.15 ml/m  RA Area:     23.40 cm LA Vol (A4C):   36.6 ml 16.69 ml/m  RA Volume:   73.60 ml  33.57 ml/m LA Biplane Vol: 50.9 ml 23.22 ml/m  AORTIC VALVE AV Vmax:           131.00 cm/s AV Vmean:          88.200 cm/s AV VTI:            0.192 m AV Peak Grad:      6.9 mmHg AV Mean Grad:      4.0 mmHg LVOT Vmax:         94.40 cm/s LVOT Vmean:        61.600 cm/s LVOT VTI:          0.170 m LVOT/AV VTI ratio: 0.89 MITRAL VALVE MV Area (PHT): 2.39 cm    SHUNTS MV Decel Time: 317 msec    Systemic VTI: 0.17 m MV E velocity: 75.50 cm/s Lennie Odor MD Electronically signed by Lennie Odor MD Signature Date/Time: 03/25/2023/5:16:49 PM    Final    CARDIAC CATHETERIZATION  Result Date: 03/25/2023 Successful Temporary Transvenous Pacemaker placement via right femoral vein: 65 cm. Rate 80 bpm, 8 mA (threshold 1.5 mm) RECOMMENDATIONS   Anticipated discharge date to be determined.   Will hold antihypertensives most notably beta-blocker.  Will need to initially treat with  sliding scale insulin plus minus basal insulin. Await for return of heart rate responsiveness as Toprol wears off.   No indication for antiplatelet therapy at this time . Bryan Lemma, MD     Patient Profile     72 y.o. male with past medical history of diabetes mellitus, hypertension, hyperlipidemia, hypothyroidism, COPD, prostate cancer admitted with syncope/complete heart block.  Patient's Toprol discontinued.  Echocardiogram shows normal LV function.  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  Syncope-secondary to  complete heart block/bradycardia.  Now status post pacemaker. Acute kidney injury-renal function has improved.  Likely related to hypoperfusion in the setting of bradycardia. Diabetes mellitus-resume preadmission medications at discharge. Hyperlipidemia-continue Crestor and Zetia. Hypertension-blood pressure trending up.  Toprol, lisinopril and hydralazine on hold.  Will resume amlodipine 5 mg daily and follow.  For questions or updates, please contact [facility] Please consult [email or web address] for contact info under        Signed, Olga Millers, MD  03/27/2023, 7:29 AM

## 2023-03-27 NOTE — Discharge Summary (Signed)
Discharge Summary    Patient ID: Benjamin Aguilar MRN: [id number]; DOB: May 06, 1951  Admit date: 03/25/2023 Discharge date: 03/28/2023  PCP:  Paulina Fusi, MD   [facility] Providers Cardiologist:  None        Discharge Diagnoses    Principal Problem:   Complete heart block [facility]) Active Problems:   Syncope and collapse   COPD (chronic obstructive pulmonary disease) (HCC)   Hypoxemia   Hypertension, essential   Mixed dyslipidemia   Primary hypothyroidism   AKI (acute kidney injury) (HCC)   Thrombocytopenia (HCC)    Diagnostic Studies/Procedures    Temporary Pacemaker 03/25/2023: Successful Temporary Transvenous Pacemaker placement via right femoral vein: 65 cm. Rate 80 bpm, 8 mA (threshold 1.5 mm)   Recommendations;   Anticipated discharge date to be determined.   Will hold antihypertensives most notably beta-blocker.  Will need to initially treat with sliding scale insulin plus minus basal insulin. Await for return of heart rate responsiveness as Toprol wears off.   No indication for antiplatelet therapy at this time . _____________  Echocardiogram 03/25/2023: Impressions: 1. Left ventricular ejection fraction, by estimation, is 55 to 60%. The  left ventricle has normal function. Left ventricular endocardial border  not optimally defined to evaluate regional wall motion. Indeterminate  diastolic filling due to E-A fusion.   2. Right ventricular systolic function is normal. The right ventricular  size is normal. Tricuspid regurgitation signal is inadequate for assessing  PA pressure.   3. The mitral valve is grossly normal. No evidence of mitral valve  regurgitation. No evidence of mitral stenosis.   4. The aortic valve was not well visualized. Aortic valve regurgitation  is not visualized.   5. The inferior vena cava is normal in size with greater than 50%  respiratory variability, suggesting right atrial pressure of 3 mmHg.   _______________  Temporary Pacemaker 03/26/2023: Successful repositioning of the trans venous pacing wire into the RV.  _______________  Pacemaker Implant 03/26/2023: Conclusions:  1. Successful DDD PPM with LBBAP lead implant.  2. Removal of right femoral temporary transvenous pacing wire.   3.  No early apparent complications.     History of Present Illness     Benjamin Aguilar is a 72 y.o. male with a history of hypertension, hyperlipidemia, type 2 diabetes mellitus, hypothyroidism, COPD, and prostate cancer who was admitted on 03/25/2023 for complete heart blocker after presenting with syncope.  Hospital Course     Consultants: EP   Complete Heart Block Syncope Patient presented on 03/25/2023 after 2 syncopal episodes that morning and was found to be in complete heart block with a ventricular escape rhythm and rates in the 20. His home Toprol-XL was stopped and he was transcutaneously paced in the ED with heart rate set at 64 bpm. However, it was not capturing and pulse was in the 20s. Therefore, he underwent placement of transcutaneous pacemaker.  Later than evening, TVP was dislodged after coughing. Adjustments to TVP were unsuccessful and patient loss consciousness. CPR was started and patient was placed on Epinephrine drip and given 1mg  of Atropine. He was transcutaneous paced and then taken back to the cath lab were transvenous pacing wire was successfully repositioned into the RV. EP was consulted and he underwent successful placement of PPM with LBBAP lead implant on 03/27/2023. He tolerated the procedure well. Chest x-ray on POD1 showed no signs of pneumothorax and device functioning normally.   COPD with Mild Hypoxemia O2 sats have been in the  high 80s during admission requiring 4L of O2 via nasal cannula. He does have a history of COPD but is not on O2 at home and only has a PRN albuterol inhaler.  He has denied any real shortness of breath. Chest x-ray showed chronic lung  disease with mild right basilar atelectasis. BNP was elevated at 564 so he was given a couple of dose of IV Lasix to see if that would help wean him off the O2. However, no real change with this. Per saturation qualification, O2 88% on room at at rest and 85% on room air while ambulating. He required 4L to main O2 of 92%. This has been felt to be due to his COPD. He will be discharged with home O2. Provided refill of home albuterol inhaler as Pharmacy stated he has not had this filled in over 200 days. Will also place outpatient referral to Pulmonology given COPD with new O2 requirement.   Hypertension Patient initially had some hypotension in setting of severe bradycardia and complete heart block. However, BP trending back up after PPM.   Hyperlipidemia Continue Crestor 20mg  daily and Zetia 10mg  daily.  AKI She had mild AKI on admission with creatinine peaking at 1.40. Likely due to hypoperfusion in setting of severe bradycardia. Renal function has returned to baseline after PPM.  Hypothyroidism TSH normal this admission. Continue home Synthroid.   Thrombocytopenia Patient has a history of thrombocytopenia. Baseline typically in the 80,000s to 120,000s range. Platelet 150,000 on admission but then in the 80,000s throughout remainder of hospitalization. Stable at 80,000 on discharge. No bleeding issues. Follow-up with PCP.   Patient was seen and examined by Dr. Jens Som today and determined to be stable for discharge. Outpatient follow-up arranged. Medications as below.     _____________  Discharge Vitals Blood pressure 113/77, pulse 88, temperature 97.7 F (36.5 C), temperature source Oral, resp. rate (!) 21, height 5' 9.02" (1.753 m), weight 100.7 kg, SpO2 91%.  Filed Weights   03/26/23 0430 03/27/23 0500 03/28/23 0535  Weight: 102.6 kg 100.5 kg 100.7 kg    Labs & Radiologic Studies    CBC Recent Labs    03/27/23 0253 03/28/23 0238  WBC 7.3 6.1  HGB 16.4 17.2*  HCT 50.0  52.2*  MCV 86.2 86.4  PLT 80* 80*   Basic Metabolic Panel Recent Labs    16/10/96 0253 03/28/23 0238  NA 139 143  K 4.3 3.8  CL 106 106  CO2 23 26  GLUCOSE 83 126*  BUN 25* 21  CREATININE 0.91 1.02  CALCIUM 9.0 9.2   Liver Function Tests No results for input(s): "AST", "ALT", "ALKPHOS", "BILITOT", "PROT", "ALBUMIN" in the last 72 hours. No results for input(s): "LIPASE", "AMYLASE" in the last 72 hours. High Sensitivity Troponin:   No results for input(s): "TROPONINIHS" in the last 720 hours.  BNP Invalid input(s): "POCBNP" D-Dimer No results for input(s): "DDIMER" in the last 72 hours. Hemoglobin A1C No results for input(s): "HGBA1C" in the last 72 hours. Fasting Lipid Panel Recent Labs    03/26/23 0253  CHOL 95  HDL 24*  LDLCALC 16  TRIG 045*  CHOLHDL 4.0   Thyroid Function Tests Recent Labs    03/25/23 1715  TSH 2.209   _____________  DG Chest 2 View  Result Date: 03/27/2023 CLINICAL DATA:  72 year old male pacemaker placement. EXAM: CHEST - 2 VIEW COMPARISON:  Portable chest 03/25/2023 and earlier. FINDINGS: AP and lateral views at 0554 hours. New left chest dual lead cardiac pacemaker.  No pneumothorax. Mildly larger lung volumes. Stable cardiac size and mediastinal contours. Tortuous thoracic aorta. Visualized tracheal air column is within normal limits. Coarse bilateral pulmonary interstitial opacity is chronic. Mildly increased curvilinear opacity at the right lung base, favor atelectasis. No convincing pulmonary edema. No pleural effusion or consolidation. No acute osseous abnormality identified. Negative visible bowel gas. IMPRESSION: 1. New left chest dual lead cardiac pacemaker. No pneumothorax or adverse features. 2. Chronic lung disease with mild right basilar atelectasis. Electronically Signed   By: Odessa Fleming M.D.   On: 03/27/2023 06:09   EP PPM/ICD IMPLANT  Result Date: 03/26/2023 CONCLUSIONS:  1. Successful DDD PPM with LBBAP lead implant.  2. Removal  of right femoral temporary transvenous pacing wire.  3.  No early apparent complications. Nobie Putnam, MD Cardiac Electrophysiology   CARDIAC CATHETERIZATION  Result Date: 03/26/2023 Successful repositioning of the trans venous pacing wire into the RV.   DG CHEST PORT 1 VIEW  Result Date: 03/26/2023 CLINICAL DATA:  Code blue EXAM: PORTABLE CHEST 1 VIEW COMPARISON:  12/25/2018 FINDINGS: Pacer wire enters the heart from the IVC with the tip in the right atrium. The heart mediastinal contours within normal limits. Aortic atherosclerosis. Interstitial prominence throughout the lungs may reflect interstitial edema. No effusions or pneumothorax. No acute bony abnormality. IMPRESSION: Pacer wire appears to be in the right atrium. Diffuse interstitial prominence, likely interstitial edema. Electronically Signed   By: Charlett Nose M.D.   On: 03/26/2023 00:31   CT Cervical Spine Wo Contrast  Result Date: 03/25/2023 CLINICAL DATA:  Trauma EXAM: CT HEAD WITHOUT CONTRAST CT CERVICAL SPINE WITHOUT CONTRAST TECHNIQUE: Multidetector CT imaging of the head and cervical spine was performed following the standard protocol without intravenous contrast. Multiplanar CT image reconstructions of the cervical spine were also generated. RADIATION DOSE REDUCTION: This exam was performed according to the departmental dose-optimization program which includes automated exposure control, adjustment of the mA and/or kV according to patient size and/or use of iterative reconstruction technique. COMPARISON:  None Available. FINDINGS: CT HEAD FINDINGS Brain: There is no mass, hemorrhage or extra-axial collection. The size and configuration of the ventricles and extra-axial CSF spaces are normal. The brain parenchyma is normal, without evidence of acute or chronic infarction. Vascular: No abnormal hyperdensity of the major intracranial arteries or dural venous sinuses. No intracranial atherosclerosis. Skull: Small right frontal scalp  hematoma.  No skull fracture. Sinuses/Orbits: No fluid levels or advanced mucosal thickening of the visualized paranasal sinuses. No mastoid or middle ear effusion. The orbits are normal. CT CERVICAL SPINE FINDINGS Alignment: No static subluxation. Facets are aligned. Occipital condyles are normally positioned. Skull base and vertebrae: No acute fracture. Soft tissues and spinal canal: No prevertebral fluid or swelling. No visible canal hematoma. Disc levels: Multilevel lower cervical degenerative disc disease. No bony spinal canal stenosis. Upper chest: No pneumothorax, pulmonary nodule or pleural effusion. Other: Normal visualized paraspinal cervical soft tissues. IMPRESSION: 1. No acute intracranial abnormality. 2. Small right frontal scalp hematoma without skull fracture. 3. No acute fracture or static subluxation of the cervical spine. Electronically Signed   By: Deatra Robinson M.D.   On: 03/25/2023 19:18   CT Head Wo Contrast  Result Date: 03/25/2023 CLINICAL DATA:  Trauma EXAM: CT HEAD WITHOUT CONTRAST CT CERVICAL SPINE WITHOUT CONTRAST TECHNIQUE: Multidetector CT imaging of the head and cervical spine was performed following the standard protocol without intravenous contrast. Multiplanar CT image reconstructions of the cervical spine were also generated. RADIATION DOSE REDUCTION: This exam was  performed according to the departmental dose-optimization program which includes automated exposure control, adjustment of the mA and/or kV according to patient size and/or use of iterative reconstruction technique. COMPARISON:  None Available. FINDINGS: CT HEAD FINDINGS Brain: There is no mass, hemorrhage or extra-axial collection. The size and configuration of the ventricles and extra-axial CSF spaces are normal. The brain parenchyma is normal, without evidence of acute or chronic infarction. Vascular: No abnormal hyperdensity of the major intracranial arteries or dural venous sinuses. No intracranial  atherosclerosis. Skull: Small right frontal scalp hematoma.  No skull fracture. Sinuses/Orbits: No fluid levels or advanced mucosal thickening of the visualized paranasal sinuses. No mastoid or middle ear effusion. The orbits are normal. CT CERVICAL SPINE FINDINGS Alignment: No static subluxation. Facets are aligned. Occipital condyles are normally positioned. Skull base and vertebrae: No acute fracture. Soft tissues and spinal canal: No prevertebral fluid or swelling. No visible canal hematoma. Disc levels: Multilevel lower cervical degenerative disc disease. No bony spinal canal stenosis. Upper chest: No pneumothorax, pulmonary nodule or pleural effusion. Other: Normal visualized paraspinal cervical soft tissues. IMPRESSION: 1. No acute intracranial abnormality. 2. Small right frontal scalp hematoma without skull fracture. 3. No acute fracture or static subluxation of the cervical spine. Electronically Signed   By: Deatra Robinson M.D.   On: 03/25/2023 19:18   ECHOCARDIOGRAM COMPLETE  Result Date: 03/25/2023    ECHOCARDIOGRAM REPORT   Patient Name:   Benjamin Aguilar Date of Exam: 03/25/2023 Medical Rec #:  [id number]             Height:       69.0 in Accession #:    [id number]            Weight:       230.0 lb Date of Birth:  1951-03-07             BSA:          2.192 m Patient Age:    72 years              BP:           103/78 mmHg Patient Gender: M                     HR:           80 bpm. Exam Location:  Inpatient Procedure: 2D Echo, Color Doppler and Cardiac Doppler Indications:    Complete Heart Block  History:        Patient has no prior history of Echocardiogram examinations.                 Prostate Cancer, Polycythemia, Alcoholic Cirrhosis,                 Arrythmias:Complete Heart Block; Risk Factors:Current Smoker,                 Dyslipidemia, Diabetes, Hypertension and ETOH Abuse.  Sonographer:    Milbert Coulter Referring Phys: [phone] LINDSAY B ROBERTS  Sonographer Comments: No parasternal window  and patient is obese. Patient on supine bed rest d/t temp pacer placement. IMPRESSIONS  1. Left ventricular ejection fraction, by estimation, is 55 to 60%. The left ventricle has normal function. Left ventricular endocardial border not optimally defined to evaluate regional wall motion. Indeterminate diastolic filling due to E-A fusion.  2. Right ventricular systolic function is normal. The right ventricular size is normal. Tricuspid regurgitation signal is inadequate for assessing PA pressure.  3. The  mitral valve is grossly normal. No evidence of mitral valve regurgitation. No evidence of mitral stenosis.  4. The aortic valve was not well visualized. Aortic valve regurgitation is not visualized.  5. The inferior vena cava is normal in size with greater than 50% respiratory variability, suggesting right atrial pressure of 3 mmHg. FINDINGS  Left Ventricle: Left ventricular ejection fraction, by estimation, is 55 to 60%. The left ventricle has normal function. Left ventricular endocardial border not optimally defined to evaluate regional wall motion. The left ventricular internal cavity size was normal in size. Suboptimal image quality limits for assessment of left ventricular hypertrophy. Indeterminate diastolic filling due to E-A fusion. Right Ventricle: The right ventricular size is normal. No increase in right ventricular wall thickness. Right ventricular systolic function is normal. Tricuspid regurgitation signal is inadequate for assessing PA pressure. Left Atrium: Left atrial size was normal in size. Right Atrium: Right atrial size was normal in size. Pericardium: There is no evidence of pericardial effusion. Presence of epicardial fat layer. Mitral Valve: The mitral valve is grossly normal. No evidence of mitral valve regurgitation. No evidence of mitral valve stenosis. Tricuspid Valve: The tricuspid valve is grossly normal. Tricuspid valve regurgitation is not demonstrated. No evidence of tricuspid stenosis.  Aortic Valve: The aortic valve was not well visualized. Aortic valve regurgitation is not visualized. Aortic valve mean gradient measures 4.0 mmHg. Aortic valve peak gradient measures 6.9 mmHg. Pulmonic Valve: The pulmonic valve was not well visualized. Pulmonic valve regurgitation is not visualized. Aorta: The aortic root was not well visualized. Venous: The inferior vena cava is normal in size with greater than 50% respiratory variability, suggesting right atrial pressure of 3 mmHg. IAS/Shunts: The atrial septum is grossly normal. Additional Comments: A device lead is visualized in the right atrium and inferior vena cava.   Diastology LV e' medial:    7.72 cm/s LV E/e' medial:  9.8 LV e' lateral:   5.87 cm/s LV E/e' lateral: 12.9  RIGHT VENTRICLE RV Basal diam:  3.70 cm RV Mid diam:    2.60 cm RV S prime:     13.10 cm/s TAPSE (M-mode): 2.2 cm LEFT ATRIUM             Index        RIGHT ATRIUM           Index LA Vol (A2C):   63.9 ml 29.15 ml/m  RA Area:     23.40 cm LA Vol (A4C):   36.6 ml 16.69 ml/m  RA Volume:   73.60 ml  33.57 ml/m LA Biplane Vol: 50.9 ml 23.22 ml/m  AORTIC VALVE AV Vmax:           131.00 cm/s AV Vmean:          88.200 cm/s AV VTI:            0.192 m AV Peak Grad:      6.9 mmHg AV Mean Grad:      4.0 mmHg LVOT Vmax:         94.40 cm/s LVOT Vmean:        61.600 cm/s LVOT VTI:          0.170 m LVOT/AV VTI ratio: 0.89 MITRAL VALVE MV Area (PHT): 2.39 cm    SHUNTS MV Decel Time: 317 msec    Systemic VTI: 0.17 m MV E velocity: 75.50 cm/s Lennie Odor MD Electronically signed by Lennie Odor MD Signature Date/Time: 03/25/2023/5:16:49 PM    Final    CARDIAC  CATHETERIZATION  Result Date: 03/25/2023 Successful Temporary Transvenous Pacemaker placement via right femoral vein: 65 cm. Rate 80 bpm, 8 mA (threshold 1.5 mm) RECOMMENDATIONS   Anticipated discharge date to be determined.   Will hold antihypertensives most notably beta-blocker.  Will need to initially treat with sliding scale insulin  plus minus basal insulin. Await for return of heart rate responsiveness as Toprol wears off.   No indication for antiplatelet therapy at this time . Bryan Lemma, MD  Disposition   Patient is being discharged home today in good condition.  Follow-up Plans & Appointments     Follow-up Information     [facility] [place] [place] Follow up.   Why: Your home health agency they will call you to set up services Contact information: [address] [place] [address] [phone]         [facility] Follow up.   Why: Your medical equipment company for oxygen and walker        [facility] Follow up.   Specialty: Cardiology Why: Wound check appointment scheduled for 04/10/2023 at 2pm. Please arrive 15 minutes early for check-in. If this date/ time does not work for you, please call our office to reschedule. Contact information: [address] [place] [address] [phone]        Nobie Putnam, MD Follow up.   Specialties: Cardiology, Radiology Why: Hospital follow-up with EP scheduled for 07/02/2023 at 4pm. Please arrive 15 minutes early for check-in. If this date/ time does not work for you, please call our office to reschedule. Contact information: [address] [place] [address] [phone]         Paulina Fusi, MD. Call.   Specialty: Internal Medicine Why: Please call and schedule a follow-up visit with your PCP in the next 1-2 weeks. Contact information: [address] [place] [address] [phone]                Discharge Instructions     Ambulatory referral to Pulmonology   Complete by: As directed    COPD with new O2 requirement after hospitalization   Reason for referral: Other        Discharge Medications   Allergies as of 03/28/2023       Reactions   Ace Inhibitors    Alcohol Other (See Comments)   Pt is a  recovering alcoholic -- prefers to not have any medication with alcohol in it.         Medication List     STOP taking these medications    hydrALAZINE 25 MG tablet Commonly known as: APRESOLINE   meloxicam 15 MG tablet Commonly known as: MOBIC       TAKE these medications    Accu-Chek Guide test strip Generic drug: glucose blood Use to check blood sugar twice a day or as directed.   albuterol 108 (90 Base) MCG/ACT inhaler Commonly known as: VENTOLIN HFA Inhale 2 puffs into the lungs every 6 (six) hours as needed for wheezing or shortness of breath.   amLODipine 5 MG tablet Commonly known as: NORVASC Take 5 mg by mouth daily.   BD Pen Needle Nano U/F 32G X 4 MM Misc Generic drug: Insulin Pen Needle SMARTSIG:injection Twice Daily   ezetimibe 10 MG tablet Commonly known as: ZETIA Take 10 mg by mouth daily.   glipiZIDE 10 MG tablet Commonly known as: GLUCOTROL Take 10 mg by  mouth 2 (two) times daily before a meal.   insulin degludec 200 UNIT/ML FlexTouch Pen Commonly known as: TRESIBA Inject 70 Units into the skin at bedtime.   Jardiance 25 MG Tabs tablet Generic drug: empagliflozin Take 25 mg by mouth daily.   levothyroxine 112 MCG tablet Commonly known as: SYNTHROID Take 112 mcg by mouth daily before breakfast.   metFORMIN 1000 MG tablet Commonly known as: GLUCOPHAGE Take by mouth.   metoprolol succinate 50 MG 24 hr tablet Commonly known as: TOPROL-XL Take 1 tablet (50 mg total) by mouth at bedtime. Take with or immediately following a meal.   Multivitamin Men 50+ Tabs Take 1 tablet by mouth daily.   omeprazole 20 MG capsule Commonly known as: PRILOSEC Take 20 mg by mouth daily.   Ozempic (1 MG/DOSE) 4 MG/3ML Sopn Generic drug: Semaglutide (1 MG/DOSE) Inject 2 mg into the skin once a week. Thursday.   pregabalin 300 MG capsule Commonly known as: LYRICA Take 300 mg by mouth 2 (two) times daily.   rosuvastatin 20 MG tablet Commonly known  as: CRESTOR Take 20 mg by mouth at bedtime.   Vascepa 1 g capsule Generic drug: icosapent Ethyl Take 2 g by mouth 2 (two) times daily.               Durable Medical Equipment  (From admission, onward)           Start     Ordered   03/28/23 1207  For home use only DME oxygen  Once       Comments: Travel concentrator  Question Answer Comment  Length of Need Lifetime   Mode or (Route) Nasal cannula   Liters per Minute 4   Frequency Continuous (stationary and portable oxygen unit needed)   Oxygen conserving device Yes   Oxygen delivery system Gas      03/28/23 1207   03/28/23 1206  For home use only DME Walker rolling  Once       Comments: weakness  Question Answer Comment  Walker: With 5 Inch Wheels   Patient needs a walker to treat with the following condition Weakness      03/28/23 1207               Outstanding Labs/Studies   N/A  Duration of Discharge Encounter   Greater than 30 minutes including physician time.  Signed, Corrin Parker, PA-C 03/28/2023, 1:54 PM

## 2023-03-27 NOTE — Care Management (Signed)
  Transition of Care [facility]) Screening Note   Patient Details  Name: Benjamin Aguilar Date of Birth: [birth year]   Transition [facility]) CM/SW Contact:    Lockie Pares, RN Phone Number: 03/27/2023, 10:28 AM    Transition of Care Department [facility]) has reviewed patient and no TOC needs have been identified at this time. We will continue to monitor patient advancement through interdisciplinary progression rounds. If new patient transition needs arise, please place a TOC consult.

## 2023-03-27 NOTE — Progress Notes (Signed)
  Patient Name: Benjamin Aguilar Date of Encounter: 03/27/2023  Primary Cardiologist: None Electrophysiologist: None  Interval Summary   The patient is doing well today. Feels better overall.  At this time, the patient denies chest pain, shortness of breath, or any new concerns.  Vital Signs    Vitals:   03/27/23 0700 03/27/23 0748 03/27/23 0800 03/27/23 0900  BP:   (!) 122/56 (!) 112/59  Pulse: 66  82 76  Resp: 16  (!) 21 (!) 21  Temp:  98.1 F (36.7 C)    TempSrc:  Oral    SpO2: 94%  91% 90%  Weight:      Height:        Intake/Output Summary (Last 24 hours) at 03/27/2023 0926 Last data filed at 03/27/2023 0300 Gross per 24 hour  Intake --  Output 1825 ml  Net -1825 ml   Filed Weights   03/25/23 1600 03/26/23 0430 03/27/23 0500  Weight: 98.2 kg 102.6 kg 100.5 kg    Physical Exam    GEN- Elderly male up in chair in NAD. Alert and oriented x 3 today.   Lungs- Clear to ausculation bilaterally, normal work of breathing Cardiac- Regular rate and rhythm, no murmurs, rubs or gallops, left chest site clean/dry.  Sling remains in place GI- soft, NT, ND, + BS Extremities- no clubbing or cyanosis. No edema  Telemetry    VP 60-80 (personally reviewed)  Device:  Abbott dual chamber PPM, implanted 03/26/23 for CHB  Hospital Course    Benjamin Aguilar is a 72 y.o. male admitted for syncopal episode in setting of CHB with ventricular escape in 20's.  Required temp TVP. Unfortunately, he coughed and TVP became dislodged requiring CPR. TVP repositioned with capture.  Subsequent PPM implanted 11/6.    Assessment & Plan    Severe Symptomatic Bradycardia due to CHB  ECHO 03/26/23 LVEF 55-60%. Note had transient AFL during placement of atrial lead during case.   -ok remove sling after lunch  -keep outer dressing in place  -reviewed discharge instructions - arm restrictions and wound care with patient  -CXR with device / leads in good position, no PTX -ok to resume  home Toprol per primary  -not on OAC prior to admission  -follow up appt's requested  HTN, HLD  -per primary   AKI  -per primary    For questions or updates, please contact [facility] Please consult [email or web address] for contact info under Cardiology/STEMI.  Signed, Canary Brim, MSN, APRN, NP-C, AGACNP-BC [facility] - Electrophysiology  03/27/2023, 9:29 AM

## 2023-03-27 NOTE — Progress Notes (Deleted)
eLink Physician-Brief Progress Note Patient Name: Benjamin Aguilar DOB: July 24, 1950 MRN: [id number]   Date of Service  03/27/2023  HPI/Events of Note  Requesting sleep aid  eICU Interventions  Trazodone as needed     Intervention Category Minor Interventions: Routine modifications to care plan (e.g. PRN medications for pain, fever)  Benjamin Aguilar 03/27/2023, 11:47 PM

## 2023-03-27 NOTE — Progress Notes (Addendum)
   I went by this afternoon to see if patient was possible stable for discharge. He did have a witnessed fall earlier this morning after sitting on the foot rest of the chair when he was trying to get up an urinate. There was no harm to the patient. He states he is feeling fine. However, his O2 sats are in the high 80s to low 90s on 4L of O2. He is not on any O2 at home. He denies feeling short of breath. Chest x-ray this morning showed chronic lung disease with mild right basilar atelectasis. He does not look volume overload on exam. He has some mild rhonchi in bilateral bases. He does have COPD but states he only using an albuterol rescue inhaler at home. Will check a BNP. If significantly elevated, will give some IV Lasix. Will keep him another night. However, he can move out of the ICU. Will place transfer orders.  Corrin Parker, PA-C 03/27/2023 1:44 PM  ADDENDUM:  BNP came back at 564. Will give one dose of IV Lasix 40mg  daily.  Corrin Parker, PA-C 03/27/2023 7:23 PM

## 2023-03-28 ENCOUNTER — Other Ambulatory Visit: Payer: Self-pay | Admitting: Student

## 2023-03-28 DIAGNOSIS — N179 Acute kidney failure, unspecified: Secondary | ICD-10-CM | POA: Insufficient documentation

## 2023-03-28 DIAGNOSIS — J449 Chronic obstructive pulmonary disease, unspecified: Secondary | ICD-10-CM | POA: Insufficient documentation

## 2023-03-28 DIAGNOSIS — R0902 Hypoxemia: Secondary | ICD-10-CM | POA: Insufficient documentation

## 2023-03-28 DIAGNOSIS — D696 Thrombocytopenia, unspecified: Secondary | ICD-10-CM | POA: Insufficient documentation

## 2023-03-28 DIAGNOSIS — R55 Syncope and collapse: Secondary | ICD-10-CM | POA: Insufficient documentation

## 2023-03-28 LAB — CBC
HCT: 52.2 % — ABNORMAL HIGH (ref 39.0–52.0)
Hemoglobin: 17.2 g/dL — ABNORMAL HIGH (ref 13.0–17.0)
MCH: 28.5 pg (ref 26.0–34.0)
MCHC: 33 g/dL (ref 30.0–36.0)
MCV: 86.4 fL (ref 80.0–100.0)
Platelets: 80 10*3/uL — ABNORMAL LOW (ref 150–400)
RBC: 6.04 MIL/uL — ABNORMAL HIGH (ref 4.22–5.81)
RDW: 14.6 % (ref 11.5–15.5)
WBC: 6.1 10*3/uL (ref 4.0–10.5)
nRBC: 0 % (ref 0.0–0.2)

## 2023-03-28 LAB — BASIC METABOLIC PANEL
Anion gap: 11 (ref 5–15)
BUN: 21 mg/dL (ref 8–23)
CO2: 26 mmol/L (ref 22–32)
Calcium: 9.2 mg/dL (ref 8.9–10.3)
Chloride: 106 mmol/L (ref 98–111)
Creatinine, Ser: 1.02 mg/dL (ref 0.61–1.24)
GFR, Estimated: >60 mL/min (ref >60)
Glucose, Bld: 126 mg/dL — ABNORMAL HIGH (ref 70–99)
Potassium: 3.8 mmol/L (ref 3.5–5.1)
Sodium: 143 mmol/L (ref 135–145)

## 2023-03-28 LAB — GLUCOSE, CAPILLARY
Glucose-Capillary: 153 mg/dL — ABNORMAL HIGH (ref 70–99)
Glucose-Capillary: 154 mg/dL — ABNORMAL HIGH (ref 70–99)
Glucose-Capillary: 141 mg/dL — ABNORMAL HIGH (ref 70–99)

## 2023-03-28 MED ORDER — METOPROLOL SUCCINATE ER 50 MG PO TB24: 50.0000 mg | ORAL_TABLET | Freq: Every day | ORAL | 2 refills | Status: DC

## 2023-03-28 MED ORDER — POTASSIUM CHLORIDE CRYS ER 20 MEQ PO TBCR
40.0000 meq | EXTENDED_RELEASE_TABLET | Freq: Once | ORAL | Status: AC
  Administered 2023-03-28: 40 meq via ORAL
  Filled 2023-03-28: qty 2

## 2023-03-28 MED ORDER — ALBUTEROL SULFATE HFA 108 (90 BASE) MCG/ACT IN AERS: 2.0000 | INHALATION_SPRAY | Freq: Four times a day (QID) | RESPIRATORY_TRACT | 1 refills | Status: AC | PRN

## 2023-03-28 MED ORDER — IPRATROPIUM-ALBUTEROL 0.5-2.5 (3) MG/3ML IN SOLN: 3.0000 mL | RESPIRATORY_TRACT | Status: DC | PRN

## 2023-03-28 MED ORDER — IPRATROPIUM-ALBUTEROL 0.5-2.5 (3) MG/3ML IN SOLN: 3.0000 mL | Freq: Three times a day (TID) | RESPIRATORY_TRACT | Status: DC

## 2023-03-28 MED ORDER — IPRATROPIUM-ALBUTEROL 0.5-2.5 (3) MG/3ML IN SOLN
3.0000 mL | Freq: Once | RESPIRATORY_TRACT | Status: AC
Start: 2023-03-28 — End: 2023-03-28
  Administered 2023-03-28: 3 mL via RESPIRATORY_TRACT
  Filled 2023-03-28: qty 3

## 2023-03-28 MED ORDER — FUROSEMIDE 10 MG/ML IJ SOLN
40.0000 mg | Freq: Once | INTRAMUSCULAR | Status: AC
  Administered 2023-03-28: 40 mg via INTRAVENOUS
  Filled 2023-03-28: qty 4

## 2023-03-28 NOTE — TOC Transition Note (Signed)
Transition of Care [facility]) - CM/SW Discharge Note   Patient Details  Name: Benjamin Aguilar MRN: [id number] Date of Birth: 15-Jul-1950  Transition of Care [facility] CM/SW Contact:  Lockie Pares, RN Phone Number: 03/28/2023, 12:18 PM   Clinical Narrative:    Spoke with patient via phone. 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 He is amenable to receive ome health and would like a company that works with his insurance. He would like a walker, informed him that he will need oxygen to go home. He is requesting a concentrator that is portable ( backpack type).  Reached out to [facility] who has accepted him for home health. [employer] will supply oxygen and walker to the bedside. He stated his brother can pick him up when discharged RN aware of DC plan..    Final next level of care: Home w Home Health Services Barriers to Discharge: No Barriers Identified   Patient Goals and CMS Choice CMS Medicare.gov Compare Post Acute Care list provided to:: Patient Choice offered to / list presented to : Patient  Discharge Placement               Home with home health          Discharge Plan and Services Additional resources added to the After Visit Summary for                  DME Arranged: Walker rolling, Oxygen DME Agency: [employer] Date DME Agency Contacted: 03/28/23 Time DME Agency Contacted: 1213 Representative spoke with at DME Agency: Vaughan Basta HH Arranged: PT, OT HH Agency: [facility] Date [facility] Agency Contacted: 03/28/23 Time HH Agency Contacted: 1218 Representative spoke with at [facility] Agency: Becky Sax  Social Determinants of Health (SDOH) Interventions SDOH Screenings   Food Insecurity: No Food Insecurity (03/26/2023)  Housing: Low Risk  (03/26/2023)  Transportation Needs: No Transportation Needs (03/26/2023)  Utilities: Not At Risk (03/26/2023)  Tobacco Use: High Risk (03/25/2023)     Readmission Risk Interventions     No  data to display

## 2023-03-28 NOTE — Evaluation (Signed)
Physical Therapy Evaluation Patient Details Name: Benjamin Aguilar MRN: [id number] DOB: 07-11-1950 Today's Date: 03/28/2023  History of Present Illness  72 y.o. male admitted 11/5 with complete heart block, syncope with code 11/5. 11/6 S/p PPM. PMHx: HTN, HLD, hypothyroidism, COPD, prostate CA, Lt TKA, neuropathy, DM  Clinical Impression  Pt pleasant, educated for fall and PPM precautions. Pt needing cues to maintain precautions with dressing and transfers. Pt benefits from use of RW and oxygen. On arrival pt on RA at 85% with 3L applied pt 91% and during gait required 4L to maintain 92%. Pt with decreased activity tolerance, balance and adherence to precautions who will benefit from acute therapy as well as HHPT to maximize mobility and safety.         If plan is discharge home, recommend the following: Assistance with cooking/housework;Assist for transportation;A little help with bathing/dressing/bathroom   Can travel by private vehicle        Equipment Recommendations Rolling walker (2 wheels);Other (comment) (oxygen)  Recommendations for Other Services       Functional Status Assessment Patient has had a recent decline in their functional status and demonstrates the ability to make significant improvements in function in a reasonable and predictable amount of time.     Precautions / Restrictions Precautions Precautions: ICD/Pacemaker;Fall;Other (comment) Precaution Comments: watch sats Restrictions Weight Bearing Restrictions: Yes LUE Weight Bearing: Non weight bearing      Mobility  Bed Mobility Overal bed mobility: Needs Assistance Bed Mobility: Supine to Sit     Supine to sit: Supervision     General bed mobility comments: bed flat, cues for sequence and precautions    Transfers Overall transfer level: Needs assistance   Transfers: Sit to/from Stand Sit to Stand: Supervision           General transfer comment: supervision with cues not to push with  LUE    Ambulation/Gait Ambulation/Gait assistance: Supervision Gait Distance (Feet): 400 Feet Assistive device: Rolling walker (2 wheels) Gait Pattern/deviations: Step-through pattern, Decreased stride length   Gait velocity interpretation: >2.62 ft/sec, indicative of community ambulatory   General Gait Details: cues for maintaining precautions and proximity to R. 93% on 3L with gait  Stairs            Wheelchair Mobility     Tilt Bed    Modified Rankin (Stroke Patients Only)       Balance Overall balance assessment: Mild deficits observed, not formally tested                                           Pertinent Vitals/Pain Pain Assessment Pain Assessment: No/denies pain    Home Living Family/patient expects to be discharged to:: Private residence Living Arrangements: Parent Available Help at Discharge: Family;Available PRN/intermittently Type of Home: House Home Access: Level entry       Home Layout: Two level;Able to live on main level with bedroom/bathroom;Laundry or work area in [employer] Equipment: Gilmer Mor - single point      Prior Function Prior Level of Function : Independent/Modified Independent;Driving                     Extremity/Trunk Assessment   Upper Extremity Assessment Upper Extremity Assessment: Overall WFL for tasks assessed    Lower Extremity Assessment Lower Extremity Assessment: Overall WFL for tasks assessed    Cervical / Trunk Assessment Cervical /  Trunk Assessment: Kyphotic  Communication   Communication Communication: No apparent difficulties  Cognition Arousal: Alert Behavior During Therapy: WFL for tasks assessed/performed Overall Cognitive Status: Impaired/Different from baseline Area of Impairment: Memory                     Memory: Decreased short-term memory, Decreased recall of precautions                  General Comments      Exercises     Assessment/Plan     PT Assessment Patient needs continued PT services  PT Problem List Decreased mobility;Decreased activity tolerance;Decreased balance;Decreased knowledge of use of DME;Cardiopulmonary status limiting activity;Decreased cognition       PT Treatment Interventions DME instruction;Gait training;Functional mobility training;Therapeutic activities;Patient/family education;Balance training;Therapeutic exercise    PT Goals (Current goals can be found in the Care Plan section)  Acute Rehab PT Goals Patient Stated Goal: return home PT Goal Formulation: With patient Time For Goal Achievement: 04/11/23 Potential to Achieve Goals: Good    Frequency Min 1X/week     Co-evaluation               AM-PAC PT "6 Clicks" Mobility  Outcome Measure Help needed turning from your back to your side while in a flat bed without using bedrails?: None Help needed moving from lying on your back to sitting on the side of a flat bed without using bedrails?: A Little Help needed moving to and from a bed to a chair (including a wheelchair)?: A Little Help needed standing up from a chair using your arms (e.g., wheelchair or bedside chair)?: A Little Help needed to walk in hospital room?: A Little Help needed climbing 3-5 steps with a railing? : A Little 6 Click Score: 19    End of Session Equipment Utilized During Treatment: Oxygen Activity Tolerance: Patient tolerated treatment well Patient left: in chair;with call bell/phone within reach;with chair alarm set Nurse Communication: Mobility status;Precautions PT Visit Diagnosis: Other abnormalities of gait and mobility (R26.89)    Time: 1610-9604 PT Time Calculation (min) (ACUTE ONLY): 22 min   Charges:   PT Evaluation $PT Eval Low Complexity: 1 Low   PT General Charges $$ ACUTE PT VISIT: 1 Visit         Merryl Hacker, PT Acute Rehabilitation Services Office: [phone]   Enedina Finner Jamus Loving 03/28/2023, 10:51 AM

## 2023-03-28 NOTE — Progress Notes (Signed)
SATURATION QUALIFICATIONS: (This note is used to comply with regulatory documentation for home oxygen)  Patient Saturations on Room Air at Rest = 88%  Patient Saturations on Room Air while Ambulating = 85%  Patient Saturations on 4 Liters of oxygen while Ambulating = 92%  Please briefly explain why patient needs home oxygen: Pt states he has felt like he has needed oxygen at home for a while. Pt has low oxygen saturation levels at rest which worsen with activity. 2-4 Liters at rest improves oxygen saturation levels.

## 2023-03-28 NOTE — Progress Notes (Signed)
Rounding Note    Patient Name: Benjamin Aguilar Date of Encounter: 03/28/2023  [facility] Cardiologist: New  Subjective   Pt denies CP or dyspnea  Inpatient Medications    Scheduled Meds:  amLODipine  5 mg Oral Daily   Chlorhexidine Gluconate Cloth  6 each Topical Daily   ezetimibe  10 mg Oral Daily   heparin  5,000 Units Subcutaneous Q8H   icosapent Ethyl  2 g Oral BID   insulin aspart  0-9 Units Subcutaneous TID PC & HS   metoprolol succinate  50 mg Oral QHS   mupirocin ointment  1 Application Nasal BID   rosuvastatin  20 mg Oral QHS   sodium chloride flush  3 mL Intravenous Q12H   Continuous Infusions:  epinephrine Stopped (03/26/23 0145)   PRN Meds: acetaminophen, ondansetron (ZOFRAN) IV   Vital Signs    Vitals:   03/28/23 0535 03/28/23 0600 03/28/23 0650 03/28/23 0700  BP:  134/84  (!) 146/83  Pulse:  73 66 65  Resp:   16 20  Temp:      TempSrc:      SpO2:  (!) 89% 90% 92%  Weight: 100.7 kg     Height:        Intake/Output Summary (Last 24 hours) at 03/28/2023 0729 Last data filed at 03/28/2023 0102 Gross per 24 hour  Intake 660 ml  Output 2650 ml  Net -1990 ml      03/28/2023    5:35 AM 03/27/2023    5:00 AM 03/26/2023    4:30 AM  Last 3 Weights  Weight (lbs) 222 lb 0.1 oz 221 lb 9 oz 226 lb 3.1 oz  Weight (kg) 100.7 kg 100.5 kg 102.6 kg      Telemetry    Ventricular pacing with PVCs- Personally Reviewed   Physical Exam   GEN: NAD, WD Neck: supple, no adenopathy Cardiac: RRR, no rub Respiratory: CTA; no wheeze; s/p pacemaker with no hematoma GI: Soft, NT/ND, no masses MS: No edema Neuro:  No focal findings Psych: Normal affect   Labs    Chemistry Recent Labs  Lab 03/26/23 0253 03/27/23 0253 03/28/23 0238  NA 141 139 143  K 3.9 4.3 3.8  CL 110 106 106  CO2 21* 23 26  GLUCOSE 115* 83 126*  BUN 29* 25* 21  CREATININE 1.07 0.91 1.02  CALCIUM 8.7* 9.0 9.2  GFRNONAA >60 >60 >60  ANIONGAP 10 10 11      Lipids  Recent Labs  Lab 03/26/23 0253  CHOL 95  TRIG 274*  HDL 24*  LDLCALC 16  CHOLHDL 4.0    Hematology Recent Labs  Lab 03/26/23 0253 03/27/23 0253 03/28/23 0238  WBC 8.5 7.3 6.1  RBC 5.85* 5.80 6.04*  HGB 16.8 16.4 17.2*  HCT 50.8 50.0 52.2*  MCV 86.8 86.2 86.4  MCH 28.7 28.3 28.5  MCHC 33.1 32.8 33.0  RDW 14.8 14.8 14.6  PLT 82* 80* 80*   Thyroid  Recent Labs  Lab 03/25/23 1715  TSH 2.209     Radiology    DG Chest 2 View  Result Date: 03/27/2023 CLINICAL DATA:  72 year old male pacemaker placement. EXAM: CHEST - 2 VIEW COMPARISON:  Portable chest 03/25/2023 and earlier. FINDINGS: AP and lateral views at 0554 hours. New left chest dual lead cardiac pacemaker. No pneumothorax. Mildly larger lung volumes. Stable cardiac size and mediastinal contours. Tortuous thoracic aorta. Visualized tracheal air column is within normal limits. Coarse bilateral pulmonary interstitial opacity is  chronic. Mildly increased curvilinear opacity at the right lung base, favor atelectasis. No convincing pulmonary edema. No pleural effusion or consolidation. No acute osseous abnormality identified. Negative visible bowel gas. IMPRESSION: 1. New left chest dual lead cardiac pacemaker. No pneumothorax or adverse features. 2. Chronic lung disease with mild right basilar atelectasis. Electronically Signed   By: Odessa Fleming M.D.   On: 03/27/2023 06:09   EP PPM/ICD IMPLANT  Result Date: 03/26/2023 CONCLUSIONS:  1. Successful DDD PPM with LBBAP lead implant.  2. Removal of right femoral temporary transvenous pacing wire.  3.  No early apparent complications. Nobie Putnam, MD Cardiac Electrophysiology      Patient Profile     72 y.o. male with past medical history of diabetes mellitus, hypertension, hyperlipidemia, hypothyroidism, COPD, prostate cancer admitted with syncope/complete heart block.  Patient's Toprol discontinued.  Echocardiogram shows normal LV function.  Assessment & Plan      Complete heart block-patient is status post permanent pacemaker.  Follow-up chest x-ray showed no pneumothorax. Syncope-secondary to complete heart block/bradycardia.  Now status post pacemaker. Hypoxemia-mild hypoxemia likely with baseline COPD.  Will give 1 dose of IV Lasix this morning and wean O2.  Ambulate.  If O2 saturations above 90% we will plan discharge later today and follow-up with electrophysiology for pacemaker. Acute kidney injury-renal function has improved.  Likely related to hypoperfusion in the setting of bradycardia. Diabetes mellitus-resume preadmission medications at discharge. Hyperlipidemia-continue Crestor and Zetia. Hypertension-blood pressure appears to be controlled on present regimen.  Will continue amlodipine 5 mg daily and toprol.  Follow blood pressure and adjust regimen as an outpatient.  For questions or updates, please contact [facility] Please consult [email or web address] for contact info under        Signed, Olga Millers, MD  03/28/2023, 7:29 AM

## 2023-03-28 NOTE — Progress Notes (Signed)
Error

## 2023-04-10 ENCOUNTER — Ambulatory Visit: Payer: Medicare Other | Attending: Cardiovascular Disease

## 2023-04-10 ENCOUNTER — Telehealth: Payer: Self-pay

## 2023-04-10 DIAGNOSIS — I442 Atrioventricular block, complete: Secondary | ICD-10-CM | POA: Diagnosis not present

## 2023-04-10 LAB — CUP PACEART INCLINIC DEVICE CHECK
Battery Remaining Longevity: 126 mo
Battery Voltage: 3.05 V
Brady Statistic RA Percent Paced: 37 %
Brady Statistic RV Percent Paced: 79 %
Date Time Interrogation Session: 20241121154540
Implantable Lead Connection Status: 753985
Implantable Lead Connection Status: 753985
Implantable Lead Implant Date: 20241106
Implantable Lead Implant Date: 20241106
Implantable Lead Location: 753859
Implantable Lead Location: 753860
Implantable Pulse Generator Implant Date: 20241106
Lead Channel Impedance Value: 450 Ohm
Lead Channel Impedance Value: 562.5 Ohm
Lead Channel Pacing Threshold Amplitude: 0.75 V
Lead Channel Pacing Threshold Amplitude: 0.75 V
Lead Channel Pacing Threshold Amplitude: 0.75 V
Lead Channel Pacing Threshold Amplitude: 0.75 V
Lead Channel Pacing Threshold Pulse Width: 0.5 ms
Lead Channel Pacing Threshold Pulse Width: 0.5 ms
Lead Channel Pacing Threshold Pulse Width: 0.5 ms
Lead Channel Pacing Threshold Pulse Width: 0.5 ms
Lead Channel Sensing Intrinsic Amplitude: 4 mV
Lead Channel Sensing Intrinsic Amplitude: 7 mV
Lead Channel Setting Pacing Amplitude: 1.125
Lead Channel Setting Pacing Amplitude: 1.625
Lead Channel Setting Pacing Pulse Width: 0.5 ms
Lead Channel Setting Sensing Sensitivity: 2 mV
Pulse Gen Model: 2272
Pulse Gen Serial Number: 8218481

## 2023-04-10 NOTE — Patient Instructions (Signed)

## 2023-04-10 NOTE — Telephone Encounter (Signed)
New Abbott pacemaker implant.  Remote transmission not received.  Left message requesting call back to walk Pt through sending transmission.

## 2023-04-10 NOTE — Progress Notes (Signed)
Wound check appointment. Steri-strips removed. Wound without redness or edema. Incision edges approximated, wound well healed. Normal device function. Thresholds, sensing, and impedances consistent with implant measurements. Device programmed at 3.5V/auto capture programmed on for extra safety margin until 3 month visit. Histogram distribution appropriate for patient and level of activity. Multiple AMS episodes noted. These were reviewed and were T.W.O.s. Reprogrammed Atrial sensitivity to AUTO. No high ventricular rates noted. Patient educated about wound care, arm mobility, lifting restrictions. ROV in 3 months with implanting physician.

## 2023-04-11 NOTE — Telephone Encounter (Signed)
Transmission received 04/10/2023 at 11:14 pm.

## 2023-06-28 ENCOUNTER — Other Ambulatory Visit: Payer: Self-pay | Admitting: Student

## 2023-06-29 NOTE — Progress Notes (Signed)
 Electrophysiology Office Note:   Date:  06/30/2023  ID:  Benjamin Aguilar, DOB 10-Jan-1951, MRN [id number]  Primary Cardiologist: None Primary Heart Failure: None Electrophysiologist: Ardeen Kohler, MD       History of Present Illness:   Benjamin Aguilar is a 73 y.o. male with h/o syncope in setting of bifascicular block s/p PPM, HTN, COPD, ETOH cirrhosis, DM II, hypothyroidism seen today for routine electrophysiology followup.   Admitted in November 2024 after syncope in setting of bifascicular block requiring TVP.  Metoprolol  was held during that admission. He had dislodgement of TVP and required brief CPR. Post PPM on 03/26/23.  Subsequent device clinic follow up notable for T-wave oversensing & atrial sensing was adjusted.    Since last being seen in our clinic the patient reports well overall.  He is "not even aware that his pacemaker is there and wonders if it is working".  No device related concerns.  He denies chest pain, palpitations, dyspnea, PND, orthopnea, nausea, vomiting, dizziness, syncope, edema, weight gain, or early satiety.   Review of systems complete and found to be negative unless listed in HPI.   EP Information / Studies Reviewed:    EKG is ordered today. Personal review as below.  EKG Interpretation Date/Time:  Monday June 30 2023 13:55:57 EST Ventricular Rate:  74 PR Interval:  222 QRS Duration:  162 QT Interval:  422 QTC Calculation: 468 R Axis:   261  Text Interpretation: Sinus rhythm with 1st degree A-V block Confirmed by Creighton Doffing ([id number]) on 06/30/2023 2:15:00 PM   PPM Interrogation-  reviewed in detail today,  See PACEART report.  Device History: Abbott Dual Chamber PPM implanted 03/26/23 for bifascicular block, intermittent CHB  Risk Assessment/Calculations:    CHA2DS2-VASc Score = 4   This indicates a 4.8% annual risk of stroke. The patient's score is based upon: CHF History: 0 HTN History: 1 Diabetes History: 1 Stroke  History: 0 Vascular Disease History: 1 Age Score: 1 Gender Score: 0             Physical Exam:   VS:  BP 112/66   Pulse 74   Ht 5\' 9"  (1.753 m)   Wt 235 lb (106.6 kg)   SpO2 91%   BMI 34.70 kg/m    Wt Readings from Last 3 Encounters:  06/30/23 235 lb (106.6 kg)  03/28/23 222 lb 0.1 oz (100.7 kg)  01/14/23 231 lb 3.2 oz (104.9 kg)     GEN: Well nourished, well developed in no acute distress NECK: No JVD; No carotid bruits CARDIAC: Regular rate and rhythm, no murmurs, rubs, gallops. 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  Reviewed that he is potentially at risk for CVA but is in a clinical grey zone in regards to guidelines for starting anticoagulation for device detected AF (with his low burden).  He indicates understanding and elects to  not start anticoagulation for now.  Will bring him back for close follow up to review AF burden in the short term.   -hold medication changes for now  -continue Toprol  50 mg daily  -pending burden review, may need to pursue rhythm control    Plan of care reviewed with Dr. Daneil Dunker in clinic.   Disposition:   Follow up with EP APP  2 months   Signed, Creighton Doffing, NP-C, AGACNP-BC [facility] - Electrophysiology  06/30/2023, 5:43 PM

## 2023-06-30 ENCOUNTER — Encounter: Payer: Self-pay | Admitting: Pulmonary Disease

## 2023-06-30 ENCOUNTER — Ambulatory Visit: Payer: Medicare Other | Attending: Pulmonary Disease | Admitting: Pulmonary Disease

## 2023-06-30 ENCOUNTER — Ambulatory Visit (INDEPENDENT_AMBULATORY_CARE_PROVIDER_SITE_OTHER): Payer: Medicare Other

## 2023-06-30 VITALS — BP 112/66 | HR 74 | Ht 69.0 in | Wt 235.0 lb

## 2023-06-30 DIAGNOSIS — I442 Atrioventricular block, complete: Secondary | ICD-10-CM

## 2023-06-30 DIAGNOSIS — Z950 Presence of cardiac pacemaker: Secondary | ICD-10-CM | POA: Diagnosis not present

## 2023-06-30 LAB — CUP PACEART INCLINIC DEVICE CHECK
Date Time Interrogation Session: 20250210144555
Implantable Lead Connection Status: 753985
Implantable Lead Connection Status: 753985
Implantable Lead Implant Date: 20241106
Implantable Lead Implant Date: 20241106
Implantable Lead Location: 753859
Implantable Lead Location: 753860
Implantable Pulse Generator Implant Date: 20241106
Pulse Gen Model: 2272
Pulse Gen Serial Number: 8218481

## 2023-06-30 NOTE — Patient Instructions (Signed)
 Medication Instructions:  Your physician recommends that you continue on your current medications as directed. Please refer to the Current Medication list given to you today.  *If you need a refill on your cardiac medications before your next appointment, please call your pharmacy*  Lab Work: None ordered If you have labs (blood work) drawn today and your tests are completely normal, you will receive your results only by: MyChart Message (if you have MyChart) OR A paper copy in the mail If you have any lab test that is abnormal or we need to change your treatment, we will call you to review the results.  Follow-Up: At [facility], you and your health needs are our priority.  As part of our continuing mission to provide you with exceptional heart care, we have created designated Provider Care Teams.  These Care Teams include your primary Cardiologist (physician) and Advanced Practice Providers (APPs -  Physician Assistants and Nurse Practitioners) who all work together to provide you with the care you need, when you need it.  Your next appointment:   2 month(s)  Provider:   Creighton Doffing, NP

## 2023-07-01 LAB — CUP PACEART REMOTE DEVICE CHECK
Battery Remaining Longevity: 122 mo
Battery Remaining Percentage: 95.5 %
Battery Voltage: 2.99 V
Brady Statistic AP VP Percent: 45 %
Brady Statistic AP VS Percent: 5.4 %
Brady Statistic AS VP Percent: 28 %
Brady Statistic AS VS Percent: 22 %
Brady Statistic RA Percent Paced: 49 %
Brady Statistic RV Percent Paced: 72 %
Date Time Interrogation Session: 20250210020016
Implantable Lead Connection Status: 753985
Implantable Lead Connection Status: 753985
Implantable Lead Implant Date: 20241106
Implantable Lead Implant Date: 20241106
Implantable Lead Location: 753859
Implantable Lead Location: 753860
Implantable Pulse Generator Implant Date: 20241106
Lead Channel Impedance Value: 450 Ohm
Lead Channel Impedance Value: 510 Ohm
Lead Channel Pacing Threshold Amplitude: 0.5 V
Lead Channel Pacing Threshold Amplitude: 0.875 V
Lead Channel Pacing Threshold Pulse Width: 0.5 ms
Lead Channel Pacing Threshold Pulse Width: 0.5 ms
Lead Channel Sensing Intrinsic Amplitude: 4.7 mV
Lead Channel Sensing Intrinsic Amplitude: 7 mV
Lead Channel Setting Pacing Amplitude: 1.125
Lead Channel Setting Pacing Amplitude: 1.5 V
Lead Channel Setting Pacing Pulse Width: 0.5 ms
Lead Channel Setting Sensing Sensitivity: 2 mV
Pulse Gen Model: 2272
Pulse Gen Serial Number: 8218481

## 2023-07-02 ENCOUNTER — Ambulatory Visit: Payer: Medicare Other | Admitting: Cardiology

## 2023-07-11 ENCOUNTER — Other Ambulatory Visit: Payer: Self-pay | Admitting: Student

## 2023-07-16 ENCOUNTER — Other Ambulatory Visit: Payer: Self-pay

## 2023-07-16 DIAGNOSIS — D751 Secondary polycythemia: Secondary | ICD-10-CM

## 2023-07-16 NOTE — Progress Notes (Unsigned)
 [facility]  [address] 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  Of note, JAK2 mutation testing came back negative.  This gentleman has a long smoking history, which is felt to be the reason behind his polycythemia.  He comes in today to reassess his hemoglobin, as well as to review his most recent low resolution CT scan, which is being done for lung cancer screening.  Unfortunately, the patient continues to smoke on a daily basis. He denies having any particular changes in his health which concern him for complications related to his elevated blood counts or his smoking.  PHYSICAL EXAM:  There were no vitals taken for this visit.  Wt Readings from Last 3 Encounters:  06/30/23 235 lb (106.6 kg)  03/28/23 222 lb 0.1 oz (100.7 kg)  01/14/23 231 lb 3.2 oz (104.9 kg)    There is no height or weight on file to calculate BMI.  Wt Readings from Last 3 Encounters:  01/11/22 226 lb 11.2 oz (102.8 kg)  12/27/21 227 lb 14.4 oz (103.4 kg)  05/06/16 234 lb (106.1 kg)   Performance status (ECOG): 1 - Symptomatic but completely ambulatory  Physical Exam Vitals and nursing note reviewed.  Constitutional:      General: He is not in acute distress.    Appearance: Normal appearance. He is normal weight.  HENT:     Head: Normocephalic and atraumatic.     Mouth/Throat:     Mouth: Mucous membranes are moist.     Pharynx: Oropharynx is clear. No oropharyngeal exudate or posterior oropharyngeal erythema.  Eyes:     General: No scleral icterus.    Extraocular Movements: Extraocular movements intact.     Conjunctiva/sclera: Conjunctivae normal.     Pupils: Pupils are equal, round, and reactive to light.  Cardiovascular:     Rate and Rhythm: Normal rate and regular rhythm.     Heart sounds:  Normal heart sounds. No murmur heard.    No friction rub. No gallop.  Pulmonary:     Effort: Pulmonary effort is normal.     Breath sounds: Normal breath sounds. No wheezing, rhonchi or rales.  Abdominal:     General: Bowel sounds are normal. There is no distension.     Palpations: Abdomen is soft. There is no hepatomegaly, splenomegaly or mass.     Tenderness: There is no abdominal tenderness.  Musculoskeletal:        General: Normal range of motion.     Cervical back: Normal range of motion and neck supple. No tenderness.     Right lower leg: No edema.     Left lower leg: No edema.  Lymphadenopathy:     Cervical: No cervical adenopathy.     Upper Body:     Right upper body: No supraclavicular or axillary adenopathy.     Left upper body: No supraclavicular or axillary adenopathy.     Lower Body: No right inguinal adenopathy. No left inguinal adenopathy.  Skin:    General: Skin is warm and dry.     Coloration: Skin is not jaundiced.     Findings: No rash.  Neurological:     Mental Status: He is alert and oriented to person, place, and time.     Cranial Nerves: No cranial nerve deficit.  Psychiatric:  Mood and Affect: Mood normal.        Behavior: Behavior normal.        Thought Content: Thought content normal.    LABS:      Latest Ref Rng & Units 03/28/2023    2:38 AM 03/27/2023    2:53 AM 03/26/2023    2:53 AM  CBC  WBC 4.0 - 10.5 K/uL 6.1  7.3  8.5   Hemoglobin 13.0 - 17.0 g/dL 16.1  09.6  04.5   Hematocrit 39.0 - 52.0 % 52.2  50.0  50.8   Platelets 150 - 400 K/uL 80  80  82       Latest Ref Rng & Units 03/28/2023    2:38 AM 03/27/2023    2:53 AM 03/26/2023    2:53 AM  CMP  Glucose 70 - 99 mg/dL 409  83  811   BUN 8 - 23 mg/dL 21  25  29    Creatinine 0.61 - 1.24 mg/dL 9.14  7.82  9.56   Sodium 135 - 145 mmol/L 143  139  141   Potassium 3.5 - 5.1 mmol/L 3.8  4.3  3.9   Chloride 98 - 111 mmol/L 106  106  110   CO2 22 - 32 mmol/L 26  23  21    Calcium 8.9 -  10.3 mg/dL 9.2  9.0  8.7    SCANS: His most recent low resolution chest CT revealed the following: FINDINGS: Cardiovascular: Heart size is normal. There is no significant pericardial fluid, thickening or pericardial calcification. There is aortic atherosclerosis, as well as atherosclerosis of the great vessels of the mediastinum and the coronary arteries, including calcified atherosclerotic plaque in the left main, left anterior descending, left circumflex and right coronary arteries. Dilatation of the pulmonic trunk (4.4 cm in diameter).  Mediastinum/Nodes: No pathologically enlarged mediastinal or hilar lymph nodes. Please note that accurate exclusion of hilar adenopathy is limited on noncontrast CT scans. Esophagus is unremarkable in appearance. No axillary lymphadenopathy.  Lungs/Pleura: Previously noted pulmonary nodules are generally stable or have resolved compared to the prior study, indicative of benign etiologies. The exception to this is a new pulmonary nodule in the anterior aspect of the right upper lobe (axial image 46) which has a volume derived mean diameter of 5.2 mm. No other larger more suspicious appearing pulmonary nodules or masses are noted. No acute consolidative airspace disease. No pleural effusions. Diffuse bronchial wall thickening with moderate centrilobular and paraseptal emphysema.  Upper Abdomen: Aortic atherosclerosis. Liver has a shrunken appearance and nodular contour, indicative of underlying cirrhosis.  Musculoskeletal: Healing fracture of the lateral aspect of the left eighth rib. There are no aggressive appearing lytic or blastic lesions noted in the visualized portions of the skeleton.  IMPRESSION: 1. Lung-RADS 3S, probably benign findings. Short-term follow-up in 6 months is recommended with repeat low-dose chest CT without contrast (please use the following order, "CT CHEST LCS NODULE FOLLOW-UP W/O CM"). 2. The "S" modifier above refers  to potentially clinically significant non lung cancer related findings. Specifically, there is aortic atherosclerosis, in addition to left main and three-vessel coronary artery disease. Please note that although the presence of coronary artery calcium documents the presence of coronary artery disease, the severity of this disease and any potential stenosis cannot be assessed on this non-gated CT examination. Assessment for potential risk factor modification, dietary therapy or pharmacologic therapy may be warranted, if clinically indicated. 3. Mild diffuse bronchial wall thickening with moderate centrilobular and paraseptal emphysema; imaging  findings suggestive of underlying COPD. 4. Dilatation of the pulmonic trunk (4.4 cm in diameter), concerning for associated pulmonary arterial hypertension. 5. Cirrhosis.  Aortic Atherosclerosis (ICD10-I70.0) and Emphysema (ICD10-J43.9).  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  In clinic today, I went over his low resolution CT scan images with him, for which he could see the very small pulmonary nodules in question.  Overall, nothing is particularly enlarged to where some type of procedure can be done to ascertain their etiology.  Nevertheless, as he continues to smoke, he will continue to undergo screening CT scans.  As it pertains to his secondary polycythemia, his hemoglobin remains relatively stable.  I once again admonished him about his continued smoking; he knows a serious aerodigestive malignancy can develop if he continues to smoke.  Overall, the patient appears to be doing fairly well.  I will see him back in 6 months to reassess his polycythemia.  I likely will repeat a low resolution chest CT in 1 year.  The patient understands all the plans discussed today and is in agreement with them.  Deneka Greenwalt Kirby Funk, MD

## 2023-07-17 ENCOUNTER — Other Ambulatory Visit: Payer: Self-pay | Admitting: Oncology

## 2023-07-17 ENCOUNTER — Telehealth: Payer: Self-pay | Admitting: Oncology

## 2023-07-17 ENCOUNTER — Inpatient Hospital Stay: Payer: Medicare Other

## 2023-07-17 ENCOUNTER — Inpatient Hospital Stay: Payer: Medicare Other | Attending: Oncology | Admitting: Oncology

## 2023-07-17 VITALS — BP 107/62 | HR 68 | Temp 99.0°F | Resp 16 | Ht 69.0 in | Wt 234.1 lb

## 2023-07-17 DIAGNOSIS — F17210 Nicotine dependence, cigarettes, uncomplicated: Secondary | ICD-10-CM | POA: Diagnosis not present

## 2023-07-17 DIAGNOSIS — D751 Secondary polycythemia: Secondary | ICD-10-CM | POA: Diagnosis present

## 2023-07-17 LAB — CBC WITH DIFFERENTIAL (CANCER CENTER ONLY)
Abs Immature Granulocytes: 0.02 10*3/uL (ref 0.00–0.07)
Basophils Absolute: 0 10*3/uL (ref 0.0–0.1)
Basophils Relative: 1 %
Eosinophils Absolute: 0.3 10*3/uL (ref 0.0–0.5)
Eosinophils Relative: 5 %
HCT: 40.8 % (ref 39.0–52.0)
Hemoglobin: 14.2 g/dL (ref 13.0–17.0)
Immature Granulocytes: 0 %
Immature Platelet Fraction: 5.2 % (ref 1.2–8.6)
Lymphocytes Relative: 23 %
Lymphs Abs: 1.2 10*3/uL (ref 0.7–4.0)
MCH: 29.5 pg (ref 26.0–34.0)
MCHC: 34.8 g/dL (ref 30.0–36.0)
MCV: 84.6 fL (ref 80.0–100.0)
Monocytes Absolute: 0.5 10*3/uL (ref 0.1–1.0)
Monocytes Relative: 9 %
Neutro Abs: 3.1 10*3/uL (ref 1.7–7.7)
Neutrophils Relative %: 62 %
Platelet Count: 109 10*3/uL — ABNORMAL LOW (ref 150–400)
RBC: 4.82 MIL/uL (ref 4.22–5.81)
RDW: 14.6 % (ref 11.5–15.5)
WBC Count: 5 10*3/uL (ref 4.0–10.5)
nRBC: 0 % (ref 0.0–0.2)
nRBC: 0 /100{WBCs}

## 2023-07-17 LAB — CMP (CANCER CENTER ONLY)
ALT: 68 U/L — ABNORMAL HIGH (ref 0–44)
AST: 52 U/L — ABNORMAL HIGH (ref 15–41)
Albumin: 4 g/dL (ref 3.5–5.0)
Alkaline Phosphatase: 82 U/L (ref 38–126)
Anion gap: 12 (ref 5–15)
BUN: 18 mg/dL (ref 8–23)
CO2: 27 mmol/L (ref 22–32)
Calcium: 8.8 mg/dL — ABNORMAL LOW (ref 8.9–10.3)
Chloride: 101 mmol/L (ref 98–111)
Creatinine: 0.93 mg/dL (ref 0.61–1.24)
GFR, Estimated: >60 mL/min (ref >60)
Glucose, Bld: 252 mg/dL — ABNORMAL HIGH (ref 70–99)
Potassium: 4.8 mmol/L (ref 3.5–5.1)
Sodium: 139 mmol/L (ref 135–145)
Total Bilirubin: 0.5 mg/dL (ref 0.0–1.2)
Total Protein: 6.7 g/dL (ref 6.5–8.1)

## 2023-07-17 MED ORDER — HYDROXYZINE HCL 50 MG PO TABS: 50.0000 mg | ORAL_TABLET | Freq: Four times a day (QID) | ORAL | 1 refills | Status: AC | PRN

## 2023-07-17 NOTE — Progress Notes (Unsigned)
 [facility]  [address] [place],  [place]  [address] [phone]  Clinic Day:  01/14/2023  Referring physician: No ref. 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  Of note, JAK2 mutation testing came back negative.  This gentleman has a long smoking history, which is felt to be the reason behind his polycythemia.  He comes in today to reassess his hemoglobin, as well as to review his most recent low resolution CT scan, which is being done for lung cancer screening.  Unfortunately, the patient continues to smoke on a daily basis. He denies having any particular changes in his health which concern him for complications related to his elevated blood counts or his smoking.  PHYSICAL EXAM:  There were no vitals taken for this visit.  Wt Readings from Last 3 Encounters:  06/30/23 235 lb (106.6 kg)  03/28/23 222 lb 0.1 oz (100.7 kg)  01/14/23 231 lb 3.2 oz (104.9 kg)    There is no height or weight on file to calculate BMI.  Wt Readings from Last 3 Encounters:  01/11/22 226 lb 11.2 oz (102.8 kg)  12/27/21 227 lb 14.4 oz (103.4 kg)  05/06/16 234 lb (106.1 kg)   Performance status (ECOG): 1 - Symptomatic but completely ambulatory  Physical Exam Vitals and nursing note reviewed.  Constitutional:      General: He is not in acute distress.    Appearance: Normal appearance. He is normal weight.  HENT:     Head: Normocephalic and atraumatic.     Mouth/Throat:     Mouth: Mucous membranes are moist.     Pharynx: Oropharynx is clear. No oropharyngeal exudate or posterior oropharyngeal erythema.  Eyes:     General: No scleral icterus.    Extraocular Movements: Extraocular movements intact.     Conjunctiva/sclera: Conjunctivae normal.     Pupils: Pupils are equal, round, and reactive to light.  Cardiovascular:     Rate and Rhythm: Normal rate and regular rhythm.     Heart sounds:  Normal heart sounds. No murmur heard.    No friction rub. No gallop.  Pulmonary:     Effort: Pulmonary effort is normal.     Breath sounds: Normal breath sounds. No wheezing, rhonchi or rales.  Abdominal:     General: Bowel sounds are normal. There is no distension.     Palpations: Abdomen is soft. There is no hepatomegaly, splenomegaly or mass.     Tenderness: There is no abdominal tenderness.  Musculoskeletal:        General: Normal range of motion.     Cervical back: Normal range of motion and neck supple. No tenderness.     Right lower leg: No edema.     Left lower leg: No edema.  Lymphadenopathy:     Cervical: No cervical adenopathy.     Upper Body:     Right upper body: No supraclavicular or axillary adenopathy.     Left upper body: No supraclavicular or axillary adenopathy.     Lower Body: No right inguinal adenopathy. No left inguinal adenopathy.  Skin:    General: Skin is warm and dry.     Coloration: Skin is not jaundiced.     Findings: No rash.  Neurological:     Mental Status: He is alert and oriented to person, place, and time.     Cranial Nerves: No cranial nerve deficit.  Psychiatric:  Mood and Affect: Mood normal.        Behavior: Behavior normal.        Thought Content: Thought content normal.    LABS:      Latest Ref Rng & Units 07/17/2023    2:10 PM 03/28/2023    2:38 AM 03/27/2023    2:53 AM  CBC  WBC 4.0 - 10.5 K/uL 5.0  6.1  7.3   Hemoglobin 13.0 - 17.0 g/dL 16.1  09.6  04.5   Hematocrit 39.0 - 52.0 % 40.8  52.2  50.0   Platelets 150 - 400 K/uL 109  80  80       Latest Ref Rng & Units 07/17/2023    2:10 PM 03/28/2023    2:38 AM 03/27/2023    2:53 AM  CMP  Glucose 70 - 99 mg/dL 409  811  83   BUN 8 - 23 mg/dL 18  21  25    Creatinine 0.61 - 1.24 mg/dL 9.14  7.82  9.56   Sodium 135 - 145 mmol/L 139  143  139   Potassium 3.5 - 5.1 mmol/L 4.8  3.8  4.3   Chloride 98 - 111 mmol/L 101  106  106   CO2 22 - 32 mmol/L 27  26  23    Calcium 8.9 -  10.3 mg/dL 8.8  9.2  9.0   Total Protein 6.5 - 8.1 g/dL 6.7     Total Bilirubin 0.0 - 1.2 mg/dL 0.5     Alkaline Phos 38 - 126 U/L 82     AST 15 - 41 U/L 52     ALT 0 - 44 U/L 68      SCANS: His most recent low resolution chest CT revealed the following: FINDINGS: Cardiovascular: Heart size is normal. There is no significant pericardial fluid, thickening or pericardial calcification. There is aortic atherosclerosis, as well as atherosclerosis of the great vessels of the mediastinum and the coronary arteries, including calcified atherosclerotic plaque in the left main, left anterior descending, left circumflex and right coronary arteries. Dilatation of the pulmonic trunk (4.4 cm in diameter).  Mediastinum/Nodes: No pathologically enlarged mediastinal or hilar lymph nodes. Please note that accurate exclusion of hilar adenopathy is limited on noncontrast CT scans. Esophagus is unremarkable in appearance. No axillary lymphadenopathy.  Lungs/Pleura: Previously noted pulmonary nodules are generally stable or have resolved compared to the prior study, indicative of benign etiologies. The exception to this is a new pulmonary nodule in the anterior aspect of the right upper lobe (axial image 46) which has a volume derived mean diameter of 5.2 mm. No other larger more suspicious appearing pulmonary nodules or masses are noted. No acute consolidative airspace disease. No pleural effusions. Diffuse bronchial wall thickening with moderate centrilobular and paraseptal emphysema.  Upper Abdomen: Aortic atherosclerosis. Liver has a shrunken appearance and nodular contour, indicative of underlying cirrhosis.  Musculoskeletal: Healing fracture of the lateral aspect of the left eighth rib. There are no aggressive appearing lytic or blastic lesions noted in the visualized portions of the skeleton.  IMPRESSION: 1. Lung-RADS 3S, probably benign findings. Short-term follow-up in 6 months is  recommended with repeat low-dose chest CT without contrast (please use the following order, "CT CHEST LCS NODULE FOLLOW-UP W/O CM"). 2. The "S" modifier above refers to potentially clinically significant non lung cancer related findings. Specifically, there is aortic atherosclerosis, in addition to left main and three-vessel coronary artery disease. Please note that although the presence of coronary artery calcium documents  the presence of coronary artery disease, the severity of this disease and any potential stenosis cannot be assessed on this non-gated CT examination. Assessment for potential risk factor modification, dietary therapy or pharmacologic therapy may be warranted, if clinically indicated. 3. Mild diffuse bronchial wall thickening with moderate centrilobular and paraseptal emphysema; imaging findings suggestive of underlying COPD. 4. Dilatation of the pulmonic trunk (4.4 cm in diameter), concerning for associated pulmonary arterial hypertension. 5. Cirrhosis.  Aortic Atherosclerosis (ICD10-I70.0) and Emphysema (ICD10-J43.9).  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  In clinic today, I went over his low resolution CT scan images with him, for which he could see the very small pulmonary nodules in question.  Overall, nothing is particularly enlarged to where some type of procedure can be done to ascertain their etiology.  Nevertheless, as he continues to smoke, he will continue to undergo screening CT scans.  As it pertains to his secondary polycythemia, his hemoglobin remains relatively stable.  I once again admonished him about his continued smoking; he knows a serious aerodigestive malignancy can develop if he continues to smoke.  Overall, the patient appears to be doing fairly well.  I will see him back in 6 months to reassess his polycythemia.  I likely will repeat a low resolution chest CT in 1 year.  The patient understands all the plans discussed  today and is in agreement with them.  Dyamond Tolosa Kirby Funk, MD

## 2023-07-17 NOTE — Telephone Encounter (Signed)
 07/17/23 Spoke with patient and confirmed next appt.

## 2023-07-18 ENCOUNTER — Telehealth: Payer: Self-pay

## 2023-07-18 NOTE — Telephone Encounter (Signed)
 Dr Melvyn Neth: this pt will need a low-dose chest CT in August 2025. Please see if Dr Hoy Finlay office can set this up for the pt. thx

## 2023-08-01 ENCOUNTER — Telehealth: Payer: Self-pay

## 2023-08-01 NOTE — Telephone Encounter (Signed)
 Per Dr. Felisa Bonier remote transmission to reevaluate afib burden.

## 2023-08-01 NOTE — Telephone Encounter (Signed)
 Outreach made to Pt.  Advised how to send a transmission.  Will continue to follow.

## 2023-08-01 NOTE — Telephone Encounter (Signed)
 Marland Kitchen

## 2023-08-04 NOTE — Progress Notes (Signed)
 Remote pacemaker transmission.

## 2023-08-04 NOTE — Addendum Note (Signed)
 Addended by: Geralyn Flash D on: 08/04/2023 04:57 PM   Modules accepted: Orders

## 2023-08-28 ENCOUNTER — Ambulatory Visit: Payer: Medicare Other | Attending: Pulmonary Disease | Admitting: Pulmonary Disease

## 2023-08-28 ENCOUNTER — Encounter: Payer: Self-pay | Admitting: Pulmonary Disease

## 2023-08-28 VITALS — BP 112/68 | HR 62 | Ht 70.0 in | Wt 232.0 lb

## 2023-08-28 DIAGNOSIS — I442 Atrioventricular block, complete: Secondary | ICD-10-CM

## 2023-08-28 DIAGNOSIS — Z950 Presence of cardiac pacemaker: Secondary | ICD-10-CM | POA: Diagnosis not present

## 2023-08-28 LAB — CUP PACEART INCLINIC DEVICE CHECK
Battery Remaining Longevity: 122 mo
Battery Voltage: 2.99 V
Brady Statistic RA Percent Paced: 50 %
Brady Statistic RV Percent Paced: 56 %
Date Time Interrogation Session: 20250410174152
Implantable Lead Connection Status: 753985
Implantable Lead Connection Status: 753985
Implantable Lead Implant Date: 20241106
Implantable Lead Implant Date: 20241106
Implantable Lead Location: 753859
Implantable Lead Location: 753860
Implantable Pulse Generator Implant Date: 20241106
Lead Channel Impedance Value: 487.5 Ohm
Lead Channel Impedance Value: 562.5 Ohm
Lead Channel Pacing Threshold Amplitude: 0.5 V
Lead Channel Pacing Threshold Amplitude: 0.875 V
Lead Channel Pacing Threshold Pulse Width: 0.5 ms
Lead Channel Pacing Threshold Pulse Width: 0.5 ms
Lead Channel Sensing Intrinsic Amplitude: 5 mV
Lead Channel Sensing Intrinsic Amplitude: 7 mV
Lead Channel Setting Pacing Amplitude: 1.125
Lead Channel Setting Pacing Amplitude: 1.5 V
Lead Channel Setting Pacing Pulse Width: 0.5 ms
Lead Channel Setting Sensing Sensitivity: 0.5 mV
Pulse Gen Model: 2272
Pulse Gen Serial Number: 8218481

## 2023-08-28 NOTE — Progress Notes (Signed)
  Electrophysiology Office Note:   Date:  08/28/2023  ID:  Benjamin Aguilar, DOB 06-Aug-1950, MRN [id number]  Primary Cardiologist: None Primary Heart Failure: None Electrophysiologist: Nobie Putnam, MD       History of Present Illness:   Benjamin Aguilar is a 73 y.o. 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  He reports he is always tired and can fall asleep in the chair at any time if he sits still for more than a few minutes.  He denies did not find specific complaints.  During last visit VIP was turned on due to 78% ventricular pacing.  He denies chest pain, palpitations, dyspnea, PND, orthopnea, nausea, vomiting, dizziness, syncope, edema, weight gain, or early satiety.   Review of systems complete and found to be negative unless listed in HPI.   EP Information / Studies Reviewed:    EKG is not ordered today. EKG from 06/30/23 reviewed which showed SR with 1st degree AVB 74 bpm      PPM Interrogation-  reviewed in detail today,  See PACEART report.  Device History: Abbott Dual Chamber PPM implanted 03/26/2023 for bifascicular block, intermittent CHB  Risk Assessment/Calculations:    CHA2DS2-VASc Score = 4   This indicates a 4.8% annual risk of stroke. The patient's score is based upon: CHF History: 0 HTN History: 1 Diabetes History: 1 Stroke History: 0 Vascular Disease History: 1 Age Score: 1 Gender Score: 0             Physical Exam:   VS:  BP 112/68   Pulse 62   Ht 5\' 10"  (1.778 m)   Wt 232 lb (105.2 kg)   SpO2 96%   BMI 33.29 kg/m    Wt Readings from Last 3 Encounters:  08/28/23 232 lb (105.2 kg)  07/17/23 234 lb 1.6 oz (106.2 kg)  06/30/23 235 lb (106.6  kg)     GEN: Well nourished, well developed in no acute distress NECK: No JVD; No carotid bruits CARDIAC: Regular rate and rhythm, no murmurs, rubs, gallops RESPIRATORY:  Clear to auscultation without rales, wheezing or rhonchi  ABDOMEN: Soft, non-tender, non-distended EXTREMITIES:  No edema; No deformity   ASSESSMENT AND PLAN:    CHB s/p Abbott PPM  -Normal PPM function -See Pace Art report -With industry support > V-A conduction confirmed at 285 ms, P PARP was extended to 300 ms from 275 ms, rate responsive Hughart turned off.  There are concerns for atrial under sensing and auto atrial sensing was turned off (P waves > 5 mV)  SCAF  Device detected 06/30/23, CHA2DS2-VASc 4  -<1% burden on device but may be underestimated due to atrial undersensing, will follow-up next month remote report -prior discussion regarding OAC / stroke risk in 06/2023 and patient elected not to start anticoagulation.  Reviewed with patient that if his burden is higher than what was previously recorded it may be a stronger indication for him to begin anticoagulation. -continue Toprol 50 mg daily    Disposition:   Follow up with Dr. Jimmey Ralph in 12 months  Signed, Canary Brim, NP-C, AGACNP-BC [facility] - Electrophysiology  08/28/2023, 5:38 PM

## 2023-08-28 NOTE — Patient Instructions (Signed)
 Medication Instructions:  Your physician recommends that you continue on your current medications as directed. Please refer to the Current Medication list given to you today.  *If you need a refill on your cardiac medications before your next appointment, please call your pharmacy*  Lab Work: None ordered If you have labs (blood work) drawn today and your tests are completely normal, you will receive your results only by: MyChart Message (if you have MyChart) OR A paper copy in the mail If you have any lab test that is abnormal or we need to change your treatment, we will call you to review the results.  Follow-Up: At [facility], you and your health needs are our priority.  As part of our continuing mission to provide you with exceptional heart care, our providers are all part of one team.  This team includes your primary Cardiologist (physician) and Advanced Practice Providers or APPs (Physician Assistants and Nurse Practitioners) who all work together to provide you with the care you need, when you need it.  Your next appointment:   1 year(s)  Provider:   Nobie Putnam, MD       1st Floor: - Lobby - Registration  - Pharmacy  - Lab - Cafe  2nd Floor: - PV Lab - Diagnostic Testing (echo, CT, nuclear med)  3rd Floor: - Vacant  4th Floor: - TCTS (cardiothoracic surgery) - AFib Clinic - Structural Heart Clinic - Vascular Surgery  - Vascular Ultrasound  5th Floor: - HeartCare Cardiology (general and EP) - Clinical Pharmacy for coumadin, hypertension, lipid, weight-loss medications, and med management appointments    Valet parking services will be available as well.

## 2023-09-29 ENCOUNTER — Ambulatory Visit: Payer: Medicare Other

## 2023-09-29 DIAGNOSIS — I442 Atrioventricular block, complete: Secondary | ICD-10-CM | POA: Diagnosis not present

## 2023-09-30 LAB — CUP PACEART REMOTE DEVICE CHECK
Battery Remaining Longevity: 116 mo
Battery Remaining Percentage: 95.5 %
Battery Voltage: 2.99 V
Brady Statistic AP VP Percent: 42 %
Brady Statistic AP VS Percent: 9.7 %
Brady Statistic AS VP Percent: 36 %
Brady Statistic AS VS Percent: 12 %
Brady Statistic RA Percent Paced: 50 %
Brady Statistic RV Percent Paced: 78 %
Date Time Interrogation Session: 20250512031042
Implantable Lead Connection Status: 753985
Implantable Lead Connection Status: 753985
Implantable Lead Implant Date: 20241106
Implantable Lead Implant Date: 20241106
Implantable Lead Location: 753859
Implantable Lead Location: 753860
Implantable Pulse Generator Implant Date: 20241106
Lead Channel Impedance Value: 480 Ohm
Lead Channel Impedance Value: 540 Ohm
Lead Channel Pacing Threshold Amplitude: 0.5 V
Lead Channel Pacing Threshold Amplitude: 0.875 V
Lead Channel Pacing Threshold Pulse Width: 0.5 ms
Lead Channel Pacing Threshold Pulse Width: 0.5 ms
Lead Channel Sensing Intrinsic Amplitude: 11.8 mV
Lead Channel Sensing Intrinsic Amplitude: 4.2 mV
Lead Channel Setting Pacing Amplitude: 1.125
Lead Channel Setting Pacing Amplitude: 1.5 V
Lead Channel Setting Pacing Pulse Width: 0.5 ms
Lead Channel Setting Sensing Sensitivity: 0.5 mV
Pulse Gen Model: 2272
Pulse Gen Serial Number: 8218481

## 2023-10-01 ENCOUNTER — Ambulatory Visit: Payer: Self-pay | Admitting: Cardiology

## 2023-10-02 NOTE — Telephone Encounter (Signed)
 LMTCB to notify patient of AF clinic referral.

## 2023-10-02 NOTE — Telephone Encounter (Signed)
-----   Message from Ardeen Kohler sent at 10/01/2023  8:09 PM EDT ----- Remote pacemaker interrogation. Presenting Rhythm:A-V dual paced. Battery and lead parameters stable with stable capture and sensing. Device programming is appropriate. AF burden increasing, now >1% with longest episode 6 hours. Will send to AF Clinic to rediscuss anti-coagulation and treatment options for AF. Continue remote monitoring.

## 2023-10-03 NOTE — Progress Notes (Signed)
Attempted to contact patient. No answer, left message to call back

## 2023-10-06 NOTE — Telephone Encounter (Signed)
 Attempted to contact the patient with Dr. Orinda Birkenhead recommendations. No answer- I left a message to please call back to the Device Clinic.

## 2023-11-13 NOTE — Progress Notes (Signed)
 Remote pacemaker transmission.

## 2023-11-13 NOTE — Addendum Note (Signed)
 Addended by: TAWNI DRILLING D on: 11/13/2023 03:20 PM   Modules accepted: Orders

## 2023-12-29 ENCOUNTER — Ambulatory Visit (INDEPENDENT_AMBULATORY_CARE_PROVIDER_SITE_OTHER): Payer: Medicare Other

## 2023-12-29 DIAGNOSIS — I442 Atrioventricular block, complete: Secondary | ICD-10-CM | POA: Diagnosis not present

## 2023-12-29 DIAGNOSIS — D696 Thrombocytopenia, unspecified: Secondary | ICD-10-CM | POA: Diagnosis not present

## 2023-12-29 LAB — CUP PACEART REMOTE DEVICE CHECK
Battery Remaining Longevity: 116 mo
Battery Remaining Percentage: 94 %
Battery Voltage: 2.99 V
Brady Statistic AP VP Percent: 49 %
Brady Statistic AP VS Percent: 2.5 %
Brady Statistic AS VP Percent: 45 %
Brady Statistic AS VS Percent: 3.1 %
Brady Statistic RA Percent Paced: 51 %
Brady Statistic RV Percent Paced: 94 %
Date Time Interrogation Session: 20250811020013
Implantable Lead Connection Status: 753985
Implantable Lead Connection Status: 753985
Implantable Lead Implant Date: 20241106
Implantable Lead Implant Date: 20241106
Implantable Lead Location: 753859
Implantable Lead Location: 753860
Implantable Pulse Generator Implant Date: 20241106
Lead Channel Impedance Value: 490 Ohm
Lead Channel Impedance Value: 550 Ohm
Lead Channel Pacing Threshold Amplitude: 0.375 V
Lead Channel Pacing Threshold Amplitude: 0.875 V
Lead Channel Pacing Threshold Pulse Width: 0.5 ms
Lead Channel Pacing Threshold Pulse Width: 0.5 ms
Lead Channel Sensing Intrinsic Amplitude: 4.3 mV
Lead Channel Sensing Intrinsic Amplitude: 7.2 mV
Lead Channel Setting Pacing Amplitude: 1.125
Lead Channel Setting Pacing Amplitude: 1.375
Lead Channel Setting Pacing Pulse Width: 0.5 ms
Lead Channel Setting Sensing Sensitivity: 0.5 mV
Pulse Gen Model: 2272
Pulse Gen Serial Number: 8218481

## 2023-12-30 ENCOUNTER — Telehealth: Payer: Self-pay

## 2023-12-30 NOTE — Telephone Encounter (Signed)
 Scheduled transmission received.  Pt with 12 hours of atrial fibrillation noted on this transmission.  Previously attempted to contact Pt to schedule follow up to discuss OAC.  Unable to reach Pt and certified letter sent.  Will make additional attempt to contact Pt to schedule follow up with Afib clinic.  Will send message to scheduling.  See scheduled transmission for details.

## 2024-01-01 ENCOUNTER — Encounter (HOSPITAL_COMMUNITY): Payer: Self-pay

## 2024-01-02 NOTE — Telephone Encounter (Signed)
 Call x1; lvmtcb to schedule appt w/ AFC or EP APP at first available,. to rediscuss Afib burden and OAC.

## 2024-01-05 NOTE — Telephone Encounter (Signed)
 Call x2; lvmtcb to schedule appt w/ AFC or EP APP at first available,. to rediscuss Afib burden and OAC.

## 2024-01-05 NOTE — Telephone Encounter (Signed)
 Spoke w/ patient - he has been scheduled with the [facility] for 8/27 (first available)

## 2024-01-13 DIAGNOSIS — F17200 Nicotine dependence, unspecified, uncomplicated: Secondary | ICD-10-CM | POA: Diagnosis not present

## 2024-01-13 DIAGNOSIS — D751 Secondary polycythemia: Secondary | ICD-10-CM | POA: Diagnosis not present

## 2024-01-13 DIAGNOSIS — D62 Acute posthemorrhagic anemia: Secondary | ICD-10-CM | POA: Diagnosis not present

## 2024-01-13 DIAGNOSIS — J449 Chronic obstructive pulmonary disease, unspecified: Secondary | ICD-10-CM | POA: Diagnosis not present

## 2024-01-13 DIAGNOSIS — I10 Essential (primary) hypertension: Secondary | ICD-10-CM | POA: Diagnosis not present

## 2024-01-13 DIAGNOSIS — E785 Hyperlipidemia, unspecified: Secondary | ICD-10-CM | POA: Diagnosis not present

## 2024-01-13 DIAGNOSIS — E1142 Type 2 diabetes mellitus with diabetic polyneuropathy: Secondary | ICD-10-CM | POA: Diagnosis not present

## 2024-01-13 DIAGNOSIS — E039 Hypothyroidism, unspecified: Secondary | ICD-10-CM | POA: Diagnosis not present

## 2024-01-13 DIAGNOSIS — R251 Tremor, unspecified: Secondary | ICD-10-CM | POA: Diagnosis not present

## 2024-01-14 ENCOUNTER — Ambulatory Visit (HOSPITAL_COMMUNITY)
Admission: RE | Admit: 2024-01-14 | Discharge: 2024-01-14 | Disposition: A | Discharge status: Home / Self Care | Source: Ambulatory Visit | Attending: Physician Assistant | Admitting: Physician Assistant

## 2024-01-14 VITALS — BP 88/58 | HR 68 | Ht 70.0 in | Wt 232.2 lb

## 2024-01-14 DIAGNOSIS — D6869 Other thrombophilia: Secondary | ICD-10-CM

## 2024-01-14 DIAGNOSIS — I480 Paroxysmal atrial fibrillation: Secondary | ICD-10-CM | POA: Diagnosis not present

## 2024-01-14 DIAGNOSIS — I4891 Unspecified atrial fibrillation: Secondary | ICD-10-CM | POA: Diagnosis not present

## 2024-01-14 MED ORDER — APIXABAN 5 MG PO TABS: 5.0000 mg | ORAL_TABLET | Freq: Two times a day (BID) | ORAL | 5 refills | Status: AC

## 2024-01-14 NOTE — Patient Instructions (Signed)
 Start Eliquis 5mg  twice a day

## 2024-01-14 NOTE — Progress Notes (Signed)
 Primary Care Physician: Keren Vicenta BRAVO, MD Primary Cardiologist: None Electrophysiologist: Fonda Kitty, MD  Referring Physician: Dr Kitty Ned Antuan Limes is a 73 y.o. male with a history of  h/o syncope in setting of bifascicular block s/p PPM, HTN, COPD, ETOH cirrhosis, DM II, hypothyroidism, atrial fibrillation who presents for follow up in the [facility].  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 However, anticoagulation was deferred at that time given the brevity of the episodes. On remote interrogation, he was found to have episodes lasting 6 and 8 hours, burden 1.6%.   Patient presents today for follow up for atrial fibrillation. He is in SR today and feels well. He was unaware of his arrhythmia. No h/o significant bleeding issues. He has not had alcohol for >20 years. Has has a remote h/o OSA and wore a CPAP for a year but stopped wearing it due to mask intolerance. He has also lost ~30 lbs since then.   Today, he denies symptoms of palpitations, chest pain, shortness of breath, orthopnea, PND, lower extremity edema, dizziness, presyncope, syncope, bleeding, or neurologic sequela. The patient is tolerating medications without difficulties and is otherwise without complaint today.    Atrial Fibrillation Risk Factors:  he does have symptoms or diagnosis of sleep apnea. he does not have a history of rheumatic fever. he does have a history of alcohol use. The patient does not have a history of early familial atrial fibrillation or other arrhythmias.  Atrial Fibrillation Management history:  Previous antiarrhythmic drugs: none Previous cardioversions: none Previous ablations: none Anticoagulation history: none  ROS- All systems are reviewed and negative except as per the HPI above.  Past Medical History:  Diagnosis Date   Anemia    Anxiety    Arthritis    Cancer (HCC)    hx of prostate cancer     Crohn's disease (HCC)    Depression    Diabetes mellitus without complication (HCC)    type II    ETOH abuse    hx quit  sober since 1999   Family history of colon cancer 03/25/2022   Family history of prostate cancer 03/25/2022   GERD (gastroesophageal reflux disease)    Heart murmur    as a child    Hepatitis    hx of hep C - Harvoni therapy for 12 weeks    Hypertension    Hypothyroidism    Insomnia    Neuromuscular disorder (HCC)    neuropathy    Peripheral neuropathy    Personal history of prostate cancer 03/25/2022   Testicular hypofunction    Tremor     Current Outpatient Medications  Medication Sig Dispense Refill   ACCU-CHEK GUIDE test strip Use to check blood sugar twice a day or as directed.     albuterol  (VENTOLIN  HFA) 108 (90 Base) MCG/ACT inhaler Inhale 2 puffs into the lungs every 6 (six) hours as needed for wheezing or shortness of breath. 8 g 1   amLODipine  (NORVASC ) 5 MG tablet Take 5 mg by mouth daily.     BD PEN NEEDLE NANO U/F 32G X 4 MM MISC SMARTSIG:injection Twice Daily     Calcium  Magnesium Zinc 333-133-5 MG TABS Take by mouth. (Patient taking differently: Take 1 tablet by mouth in the morning.)     chlorhexidine  (PERIDEX ) 0.12 % solution Use as directed in the mouth or throat as needed.     desvenlafaxine (PRISTIQ) 50 MG  24 hr tablet Take 50 mg by mouth daily.     ezetimibe  (ZETIA ) 10 MG tablet Take 10 mg by mouth daily.     glipiZIDE  (GLUCOTROL ) 10 MG tablet Take 10 mg by mouth 2 (two) times daily before a meal.      hydrALAZINE (APRESOLINE) 25 MG tablet Take 25 mg by mouth 2 (two) times daily.     hydrOXYzine  (ATARAX ) 50 MG tablet Take 1 tablet (50 mg total) by mouth every 6 (six) hours as needed. 40 tablet 1   insulin  degludec (TRESIBA) 200 UNIT/ML FlexTouch Pen Inject 70 Units into the skin at bedtime. (Patient taking differently: Inject 90 Units into the skin at bedtime.)     JARDIANCE 25 MG TABS tablet Take 25 mg by mouth daily.     levothyroxine   (SYNTHROID , LEVOTHROID) 112 MCG tablet Take 112 mcg by mouth daily before breakfast.     meloxicam (MOBIC) 15 MG tablet Take 15 mg by mouth daily.     metFORMIN (GLUCOPHAGE) 1000 MG tablet Take by mouth. (Patient taking differently: Take 1,000 mg by mouth 2 (two) times daily with a meal.)     metoprolol  succinate (TOPROL -XL) 50 MG 24 hr tablet Take 1 tablet by mouth at bedtime. Take with or immediately following a meal. 90 tablet 3   Multiple Vitamins-Minerals (MULTIVITAMIN MEN 50+) TABS Take 1 tablet by mouth daily.     omeprazole (PRILOSEC) 20 MG capsule Take 20 mg by mouth daily.     OZEMPIC, 2 MG/DOSE, 8 MG/3ML SOPN Inject 2 mg into the skin once a week.     pregabalin  (LYRICA ) 300 MG capsule Take 300 mg by mouth 2 (two) times daily.     rosuvastatin  (CRESTOR ) 20 MG tablet Take 20 mg by mouth at bedtime.     VASCEPA  1 g capsule Take 2 g by mouth 2 (two) times daily.     No current facility-administered medications for this encounter.    Physical Exam: BP (!) 88/58   Pulse 68   Ht 5' 10 (1.778 m)   Wt 105.3 kg   BMI 33.32 kg/m   GEN: Well nourished, well developed in no acute distress CARDIAC: Regular rate and rhythm, no murmurs, rubs, gallops RESPIRATORY:  Clear to auscultation without rales, wheezing or rhonchi  ABDOMEN: Soft, non-tender, non-distended EXTREMITIES:  No edema; No deformity   Wt Readings from Last 3 Encounters:  01/14/24 105.3 kg  08/28/23 105.2 kg  07/17/23 106.2 kg     EKG today demonstrates  A sense V paced rhythm Vent. rate 68 BPM PR interval 234 ms QRS duration 132 ms QT/QTcB 422/448 ms   Echo 03/25/23 demonstrated   1. Left ventricular ejection fraction, by estimation, is 55 to 60%. The  left ventricle has normal function. Left ventricular endocardial border  not optimally defined to evaluate regional wall motion. Indeterminate  diastolic filling due to E-A fusion.   2. Right ventricular systolic function is normal. The right ventricular  size  is normal. Tricuspid regurgitation signal is inadequate for assessing  PA pressure.   3. The mitral valve is grossly normal. No evidence of mitral valve  regurgitation. No evidence of mitral stenosis.   4. The aortic valve was not well visualized. Aortic valve regurgitation  is not visualized.   5. The inferior vena cava is normal in size with greater than 50%  respiratory variability, suggesting right atrial pressure of 3 mmHg.    CHA2DS2-VASc Score = 4  The patient's score is based  upon: CHF History: 0 HTN History: 1 Diabetes History: 1 Stroke History: 0 Vascular Disease History: 1 Age Score: 1 Gender Score: 0       ASSESSMENT AND PLAN: Paroxysmal Atrial Fibrillation (ICD10:  I48.0) The patient's CHA2DS2-VASc score is 4, indicating a 4.8% annual risk of stroke.   General education about afib provided and questions answered. We also discussed his stroke risk and the risks and benefits of anticoagulation. Will start Eliquis  5 mg BID. Will need to watch platelet count.  Continue Toprol  50 mg daily  Secondary Hypercoagulable State (ICD10:  D68.69) The patient is at significant risk for stroke/thromboembolism based upon his CHA2DS2-VASc Score of 4.  Start Apixaban  (Eliquis ).   CHB S/p PPM, followed by Dr Kennyth  HTN Low today, patient asymptomatic. Typically within normal limits at home. No changes today.    Follow up in the AF clinic in 4-6 weeks.    [facility] [address] [place], [place] [address] [phone]

## 2024-01-25 ENCOUNTER — Ambulatory Visit: Payer: Self-pay | Admitting: Cardiology

## 2024-01-31 DIAGNOSIS — R531 Weakness: Secondary | ICD-10-CM | POA: Diagnosis not present

## 2024-01-31 DIAGNOSIS — I10 Essential (primary) hypertension: Secondary | ICD-10-CM | POA: Diagnosis not present

## 2024-01-31 DIAGNOSIS — D696 Thrombocytopenia, unspecified: Secondary | ICD-10-CM | POA: Diagnosis not present

## 2024-02-13 NOTE — Progress Notes (Signed)
 Remote PPM Transmission

## 2024-02-19 ENCOUNTER — Ambulatory Visit (HOSPITAL_COMMUNITY)
Admission: RE | Admit: 2024-02-19 | Discharge: 2024-02-19 | Disposition: A | Discharge status: Home / Self Care | Source: Ambulatory Visit | Attending: Physician Assistant | Admitting: Physician Assistant

## 2024-02-19 VITALS — BP 96/62 | HR 63 | Ht 70.0 in | Wt 231.4 lb

## 2024-02-19 DIAGNOSIS — D6869 Other thrombophilia: Secondary | ICD-10-CM

## 2024-02-19 DIAGNOSIS — I480 Paroxysmal atrial fibrillation: Secondary | ICD-10-CM

## 2024-02-19 DIAGNOSIS — I4891 Unspecified atrial fibrillation: Secondary | ICD-10-CM

## 2024-02-19 NOTE — Progress Notes (Signed)
 Primary Care Physician: Keren Vicenta BRAVO, MD Primary Cardiologist: None Electrophysiologist: Fonda Kitty, MD  Referring Physician: Dr Kitty Ned Kinser Fellman is a 73 y.o. male with a history of  h/o syncope in setting of bifascicular block s/p PPM, HTN, COPD, ETOH cirrhosis, DM II, hypothyroidism, atrial fibrillation who presents for follow up in the [facility].  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 However, anticoagulation was deferred at that time given the brevity of the episodes. On remote interrogation, he was found to have episodes lasting 6 and 8 hours, burden 1.6%. he was started on Eliquis  for stroke prevention.    Patient returns for follow up for atrial fibrillation. He is in SR today and feeling well. He has not noticed any bleeding issues since starting Eliquis . He did run out of the medication but has since got it filled and restarted.   Today, he  denies symptoms of palpitations, chest pain, shortness of breath, orthopnea, PND, lower extremity edema, dizziness, presyncope, syncope, bleeding, or neurologic sequela. The patient is tolerating medications without difficulties and is otherwise without complaint today.    Atrial Fibrillation Risk Factors:  he does have symptoms or diagnosis of sleep apnea. he does not have a history of rheumatic fever. he does have a history of alcohol use. Not had alcohol in >20 years.  The patient does not have a history of early familial atrial fibrillation or other arrhythmias.  Atrial Fibrillation Management history:  Previous antiarrhythmic drugs: none Previous cardioversions: none Previous ablations: none Anticoagulation history: Eliquis   ROS- All systems are reviewed and negative except as per the HPI above.  Past Medical History:  Diagnosis Date   Anemia    Anxiety    Arthritis    Cancer (HCC)    hx of prostate cancer    Crohn's disease (HCC)     Depression    Diabetes mellitus without complication (HCC)    type II    ETOH abuse    hx quit  sober since 1999   Family history of colon cancer 03/25/2022   Family history of prostate cancer 03/25/2022   GERD (gastroesophageal reflux disease)    Heart murmur    as a child    Hepatitis    hx of hep C - Harvoni therapy for 12 weeks    Hypertension    Hypothyroidism    Insomnia    Neuromuscular disorder (HCC)    neuropathy    Peripheral neuropathy    Personal history of prostate cancer 03/25/2022   Testicular hypofunction    Tremor     Current Outpatient Medications  Medication Sig Dispense Refill   albuterol  (VENTOLIN  HFA) 108 (90 Base) MCG/ACT inhaler Inhale 2 puffs into the lungs every 6 (six) hours as needed for wheezing or shortness of breath. 8 g 1   amLODipine  (NORVASC ) 5 MG tablet Take 5 mg by mouth daily.     apixaban  (ELIQUIS ) 5 MG TABS tablet Take 1 tablet (5 mg total) by mouth 2 (two) times daily. 60 tablet 5   BD PEN NEEDLE NANO U/F 32G X 4 MM MISC SMARTSIG:injection Twice Daily     Calcium  Magnesium Zinc 333-133-5 MG TABS Take by mouth.     chlorhexidine  (PERIDEX ) 0.12 % solution Use as directed in the mouth or throat as needed.     desvenlafaxine (PRISTIQ) 50 MG 24 hr tablet Take 50 mg by mouth daily.     ezetimibe  (  ZETIA ) 10 MG tablet Take 10 mg by mouth daily.     glipiZIDE  (GLUCOTROL ) 10 MG tablet Take 10 mg by mouth 2 (two) times daily before a meal.      hydrALAZINE (APRESOLINE) 25 MG tablet Take 25 mg by mouth 2 (two) times daily.     hydrOXYzine  (ATARAX ) 50 MG tablet Take 1 tablet (50 mg total) by mouth every 6 (six) hours as needed. 40 tablet 1   insulin  degludec (TRESIBA) 200 UNIT/ML FlexTouch Pen Inject 70 Units into the skin at bedtime. (Patient taking differently: Inject 90 Units into the skin at bedtime.)     JARDIANCE 25 MG TABS tablet Take 25 mg by mouth daily.     levothyroxine  (SYNTHROID , LEVOTHROID) 112 MCG tablet Take 112 mcg by mouth daily  before breakfast.     meloxicam (MOBIC) 15 MG tablet Take 15 mg by mouth daily.     metFORMIN (GLUCOPHAGE) 1000 MG tablet Take by mouth.     metoprolol  succinate (TOPROL -XL) 50 MG 24 hr tablet Take 1 tablet by mouth at bedtime. Take with or immediately following a meal. 90 tablet 3   Multiple Vitamins-Minerals (MULTIVITAMIN MEN 50+) TABS Take 1 tablet by mouth daily.     omeprazole (PRILOSEC) 20 MG capsule Take 20 mg by mouth daily.     OZEMPIC, 2 MG/DOSE, 8 MG/3ML SOPN Inject 2 mg into the skin once a week.     pregabalin  (LYRICA ) 300 MG capsule Take 300 mg by mouth 2 (two) times daily.     rosuvastatin  (CRESTOR ) 20 MG tablet Take 20 mg by mouth at bedtime.     VASCEPA  1 g capsule Take 2 g by mouth 2 (two) times daily.     ACCU-CHEK GUIDE test strip Use to check blood sugar twice a day or as directed.     No current facility-administered medications for this encounter.    Physical Exam: BP 96/62   Pulse 63   Ht 5' 10 (1.778 m)   Wt 105 kg   BMI 33.20 kg/m   GEN: Well nourished, well developed in no acute distress CARDIAC: Regular rate and rhythm, no murmurs, rubs, gallops RESPIRATORY:  Clear to auscultation without rales, wheezing or rhonchi  ABDOMEN: Soft, non-tender, non-distended EXTREMITIES:  No edema; No deformity    Wt Readings from Last 3 Encounters:  02/19/24 105 kg  01/14/24 105.3 kg  08/28/23 105.2 kg     EKG today demonstrates  A sense V paced rhythm Vent. rate 63 BPM PR interval 248 ms QRS duration 130 ms QT/QTcB 428/437 ms   Echo 03/25/23 demonstrated   1. Left ventricular ejection fraction, by estimation, is 55 to 60%. The  left ventricle has normal function. Left ventricular endocardial border  not optimally defined to evaluate regional wall motion. Indeterminate  diastolic filling due to E-A fusion.   2. Right ventricular systolic function is normal. The right ventricular  size is normal. Tricuspid regurgitation signal is inadequate for assessing   PA pressure.   3. The mitral valve is grossly normal. No evidence of mitral valve  regurgitation. No evidence of mitral stenosis.   4. The aortic valve was not well visualized. Aortic valve regurgitation  is not visualized.   5. The inferior vena cava is normal in size with greater than 50%  respiratory variability, suggesting right atrial pressure of 3 mmHg.    CHA2DS2-VASc Score = 4  The patient's score is based upon: CHF History: 0 HTN History: 1 Diabetes History: 1  Stroke History: 0 Vascular Disease History: 1 Age Score: 1 Gender Score: 0       ASSESSMENT AND PLAN: Paroxysmal Atrial Fibrillation (ICD10:  I48.0) The patient's CHA2DS2-VASc score is 4, indicating a 4.8% annual risk of stroke.   Patient appears to be maintaining SR Continue Eliquis  5 mg BID Continue Toprol  50 mg daily  Secondary Hypercoagulable State (ICD10:  D68.69) The patient is at significant risk for stroke/thromboembolism based upon his CHA2DS2-VASc Score of 4.  Continue Apixaban  (Eliquis ). No bleeding issues. Check cbc today.   CHB S/p PPM, followed by Dr Kennyth  HTN Stable on current regimen  OSA  Remote history of same, did not tolerate CPAP.   Follow up with Dr Kennyth per recall.    [facility] [address] [place], [place] [address] [phone]

## 2024-02-20 ENCOUNTER — Ambulatory Visit (HOSPITAL_COMMUNITY): Payer: Self-pay | Admitting: Physician Assistant

## 2024-02-20 LAB — CBC
Hematocrit: 50.6 % (ref 37.5–51.0)
Hemoglobin: 16.3 g/dL (ref 13.0–17.7)
MCH: 27.8 pg (ref 26.6–33.0)
MCHC: 32.2 g/dL (ref 31.5–35.7)
MCV: 86 fL (ref 79–97)
Platelets: 115 x10E3/uL — ABNORMAL LOW (ref 150–450)
RBC: 5.87 x10E6/uL — ABNORMAL HIGH (ref 4.14–5.80)
RDW: 14 % (ref 11.6–15.4)
WBC: 6.7 x10E3/uL (ref 3.4–10.8)

## 2024-02-28 DIAGNOSIS — D696 Thrombocytopenia, unspecified: Secondary | ICD-10-CM | POA: Diagnosis not present

## 2024-03-29 ENCOUNTER — Ambulatory Visit (INDEPENDENT_AMBULATORY_CARE_PROVIDER_SITE_OTHER): Payer: Medicare Other

## 2024-03-29 DIAGNOSIS — I442 Atrioventricular block, complete: Secondary | ICD-10-CM

## 2024-03-29 LAB — CUP PACEART REMOTE DEVICE CHECK
Battery Remaining Longevity: 112 mo
Battery Remaining Percentage: 92 %
Battery Voltage: 2.99 V
Brady Statistic AP VP Percent: 49 %
Brady Statistic AP VS Percent: 1.4 %
Brady Statistic AS VP Percent: 48 %
Brady Statistic AS VS Percent: 1.8 %
Brady Statistic RA Percent Paced: 49 %
Brady Statistic RV Percent Paced: 96 %
Date Time Interrogation Session: 20251110020013
Implantable Lead Connection Status: 753985
Implantable Lead Connection Status: 753985
Implantable Lead Implant Date: 20241106
Implantable Lead Implant Date: 20241106
Implantable Lead Location: 753859
Implantable Lead Location: 753860
Implantable Pulse Generator Implant Date: 20241106
Lead Channel Impedance Value: 480 Ohm
Lead Channel Impedance Value: 530 Ohm
Lead Channel Pacing Threshold Amplitude: 0.375 V
Lead Channel Pacing Threshold Amplitude: 0.875 V
Lead Channel Pacing Threshold Pulse Width: 0.5 ms
Lead Channel Pacing Threshold Pulse Width: 0.5 ms
Lead Channel Sensing Intrinsic Amplitude: 10.3 mV
Lead Channel Sensing Intrinsic Amplitude: 3.6 mV
Lead Channel Setting Pacing Amplitude: 1.125
Lead Channel Setting Pacing Amplitude: 1.375
Lead Channel Setting Pacing Pulse Width: 0.5 ms
Lead Channel Setting Sensing Sensitivity: 0.5 mV
Pulse Gen Model: 2272
Pulse Gen Serial Number: 8218481

## 2024-04-02 NOTE — Progress Notes (Signed)
 Remote PPM Transmission

## 2024-04-04 ENCOUNTER — Ambulatory Visit: Payer: Self-pay | Admitting: Cardiology

## 2024-04-05 NOTE — Telephone Encounter (Signed)
-----   Message from Prentice JINNY Silvan, RN sent at 04/05/2024  2:03 PM EST -----

## 2024-04-05 NOTE — Progress Notes (Signed)
 Called patient to schedule device clinic appointment for reprogramming changes to be made  No answer  Left detailed message requesting a call back and clinic number provided

## 2024-04-06 NOTE — Telephone Encounter (Signed)
 Patient has device clinic appointment on 11/26 to address.

## 2024-04-14 ENCOUNTER — Ambulatory Visit: Attending: Cardiology | Admitting: *Deleted

## 2024-04-14 DIAGNOSIS — I480 Paroxysmal atrial fibrillation: Secondary | ICD-10-CM

## 2024-04-14 LAB — CUP PACEART INCLINIC DEVICE CHECK
Battery Remaining Longevity: 110 mo
Battery Voltage: 2.99 V
Brady Statistic RA Percent Paced: 50 %
Brady Statistic RV Percent Paced: 97 %
Date Time Interrogation Session: 20251126172551
Implantable Lead Connection Status: 753985
Implantable Lead Connection Status: 753985
Implantable Lead Implant Date: 20241106
Implantable Lead Implant Date: 20241106
Implantable Lead Location: 753859
Implantable Lead Location: 753860
Implantable Pulse Generator Implant Date: 20241106
Lead Channel Impedance Value: 512.5 Ohm
Lead Channel Impedance Value: 512.5 Ohm
Lead Channel Pacing Threshold Amplitude: 0.375 V
Lead Channel Pacing Threshold Amplitude: 0.875 V
Lead Channel Pacing Threshold Pulse Width: 0.5 ms
Lead Channel Pacing Threshold Pulse Width: 0.5 ms
Lead Channel Sensing Intrinsic Amplitude: 4.1 mV
Lead Channel Sensing Intrinsic Amplitude: 8.8 mV
Lead Channel Setting Pacing Amplitude: 1.125
Lead Channel Setting Pacing Amplitude: 1.375
Lead Channel Setting Pacing Pulse Width: 0.5 ms
Lead Channel Setting Sensing Sensitivity: 0.5 mV
Pulse Gen Model: 2272
Pulse Gen Serial Number: 8218481

## 2024-04-14 NOTE — Patient Instructions (Signed)
Patient will follow up as scheduled.

## 2024-04-14 NOTE — Progress Notes (Signed)
 Patient seen in clinic today to make sensitivity adjustment on atrial lead to prevent further FFOS issues  Atrial lead sensitivity changed from 0.5 mV ---> 0.75 mV  No device testing performed  No charge for visit  Patient will follow up as scheduled

## 2024-04-19 ENCOUNTER — Ambulatory Visit: Payer: Self-pay | Admitting: Cardiology

## 2024-07-16 ENCOUNTER — Ambulatory Visit: Payer: Medicare Other | Admitting: Oncology

## 2024-07-16 ENCOUNTER — Other Ambulatory Visit: Payer: Medicare Other
